# Patient Record
Sex: Female | Born: 1970 | Race: White | Hispanic: No | Marital: Single | Smoking: Never smoker
Health system: Southern US, Community
[De-identification: ages and names within clinical notes are randomized; demographics above are authoritative.]

## PROBLEM LIST (undated history)

## (undated) DIAGNOSIS — J45909 Unspecified asthma, uncomplicated: Secondary | ICD-10-CM

## (undated) DIAGNOSIS — N200 Calculus of kidney: Secondary | ICD-10-CM

## (undated) DIAGNOSIS — F329 Major depressive disorder, single episode, unspecified: Secondary | ICD-10-CM

## (undated) DIAGNOSIS — G43909 Migraine, unspecified, not intractable, without status migrainosus: Secondary | ICD-10-CM

## (undated) DIAGNOSIS — K2270 Barrett's esophagus without dysplasia: Secondary | ICD-10-CM

## (undated) DIAGNOSIS — I440 Atrioventricular block, first degree: Secondary | ICD-10-CM

## (undated) DIAGNOSIS — E039 Hypothyroidism, unspecified: Secondary | ICD-10-CM

## (undated) DIAGNOSIS — F419 Anxiety disorder, unspecified: Secondary | ICD-10-CM

## (undated) DIAGNOSIS — E559 Vitamin D deficiency, unspecified: Secondary | ICD-10-CM

## (undated) DIAGNOSIS — M659 Unspecified synovitis and tenosynovitis, unspecified site: Secondary | ICD-10-CM

## (undated) DIAGNOSIS — R945 Abnormal results of liver function studies: Secondary | ICD-10-CM

## (undated) DIAGNOSIS — R7989 Other specified abnormal findings of blood chemistry: Secondary | ICD-10-CM

## (undated) DIAGNOSIS — E041 Nontoxic single thyroid nodule: Secondary | ICD-10-CM

## (undated) DIAGNOSIS — E079 Disorder of thyroid, unspecified: Secondary | ICD-10-CM

## (undated) DIAGNOSIS — M1990 Unspecified osteoarthritis, unspecified site: Secondary | ICD-10-CM

## (undated) DIAGNOSIS — G4730 Sleep apnea, unspecified: Secondary | ICD-10-CM

## (undated) DIAGNOSIS — G2581 Restless legs syndrome: Secondary | ICD-10-CM

## (undated) DIAGNOSIS — K219 Gastro-esophageal reflux disease without esophagitis: Secondary | ICD-10-CM

## (undated) DIAGNOSIS — Z9889 Other specified postprocedural states: Secondary | ICD-10-CM

## (undated) DIAGNOSIS — R112 Nausea with vomiting, unspecified: Secondary | ICD-10-CM

## (undated) DIAGNOSIS — F32A Depression, unspecified: Secondary | ICD-10-CM

## (undated) HISTORY — PX: ABDOMINAL HYSTERECTOMY: SHX81

## (undated) HISTORY — PX: UPPER GI ENDOSCOPY: SHX6162

## (undated) HISTORY — PX: OTHER SURGICAL HISTORY: SHX169

## (undated) HISTORY — PX: EXCISION MORTON'S NEUROMA: SHX5013

## (undated) HISTORY — PX: HYSTEROSCOPY W/ ENDOMETRIAL ABLATION: SUR665

## (undated) HISTORY — PX: COLONOSCOPY: SHX174

## (undated) HISTORY — PX: OSTECTOMY CALCANEUS: SUR969

## (undated) HISTORY — PX: MENISECTOMY: SHX5181

## (undated) HISTORY — PX: TENOTOMY: SHX397

## (undated) HISTORY — PX: TUBAL LIGATION: SHX77

## (undated) HISTORY — PX: BUNIONECTOMY: SHX129

## (undated) HISTORY — PX: CHOLECYSTECTOMY: SHX55

## (undated) HISTORY — PX: EXPLORATORY LAPAROTOMY: SUR591

## (undated) HISTORY — PX: KNEE ARTHROSCOPY: SUR90

## (undated) HISTORY — PX: BREAST RECONSTRUCTION: SHX9

## (undated) HISTORY — PX: TOTAL THYROIDECTOMY: SHX2547

---
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
## 2005-08-28 HISTORY — PX: OTHER SURGICAL HISTORY: SHX169

## 2009-08-28 HISTORY — PX: BLADDER SUSPENSION: SHX72

## 2017-04-20 DIAGNOSIS — M170 Bilateral primary osteoarthritis of knee: Secondary | ICD-10-CM | POA: Insufficient documentation

## 2017-04-20 DIAGNOSIS — G5622 Lesion of ulnar nerve, left upper limb: Secondary | ICD-10-CM | POA: Insufficient documentation

## 2017-04-20 DIAGNOSIS — M778 Other enthesopathies, not elsewhere classified: Secondary | ICD-10-CM | POA: Insufficient documentation

## 2017-05-22 DIAGNOSIS — G2581 Restless legs syndrome: Secondary | ICD-10-CM | POA: Insufficient documentation

## 2017-05-22 DIAGNOSIS — K2270 Barrett's esophagus without dysplasia: Secondary | ICD-10-CM | POA: Insufficient documentation

## 2017-05-22 DIAGNOSIS — E890 Postprocedural hypothyroidism: Secondary | ICD-10-CM | POA: Insufficient documentation

## 2017-05-22 DIAGNOSIS — G43409 Hemiplegic migraine, not intractable, without status migrainosus: Secondary | ICD-10-CM | POA: Insufficient documentation

## 2017-05-22 DIAGNOSIS — K219 Gastro-esophageal reflux disease without esophagitis: Secondary | ICD-10-CM | POA: Insufficient documentation

## 2017-05-22 DIAGNOSIS — F329 Major depressive disorder, single episode, unspecified: Secondary | ICD-10-CM | POA: Insufficient documentation

## 2017-05-22 DIAGNOSIS — E559 Vitamin D deficiency, unspecified: Secondary | ICD-10-CM | POA: Insufficient documentation

## 2017-05-23 DIAGNOSIS — N200 Calculus of kidney: Secondary | ICD-10-CM | POA: Insufficient documentation

## 2017-05-23 DIAGNOSIS — G4733 Obstructive sleep apnea (adult) (pediatric): Secondary | ICD-10-CM | POA: Insufficient documentation

## 2017-05-23 DIAGNOSIS — F411 Generalized anxiety disorder: Secondary | ICD-10-CM | POA: Insufficient documentation

## 2017-05-23 DIAGNOSIS — E282 Polycystic ovarian syndrome: Secondary | ICD-10-CM | POA: Insufficient documentation

## 2017-05-23 DIAGNOSIS — K635 Polyp of colon: Secondary | ICD-10-CM | POA: Insufficient documentation

## 2017-05-23 DIAGNOSIS — E042 Nontoxic multinodular goiter: Secondary | ICD-10-CM | POA: Insufficient documentation

## 2017-05-23 DIAGNOSIS — Z9989 Dependence on other enabling machines and devices: Secondary | ICD-10-CM

## 2017-06-01 ENCOUNTER — Ambulatory Visit: Payer: BLUE CROSS/BLUE SHIELD | Admitting: Podiatry

## 2017-06-05 ENCOUNTER — Encounter: Payer: Self-pay | Admitting: Podiatry

## 2017-06-05 ENCOUNTER — Ambulatory Visit: Payer: BLUE CROSS/BLUE SHIELD

## 2017-06-05 ENCOUNTER — Ambulatory Visit (INDEPENDENT_AMBULATORY_CARE_PROVIDER_SITE_OTHER): Payer: BLUE CROSS/BLUE SHIELD | Admitting: Podiatry

## 2017-06-05 DIAGNOSIS — M2042 Other hammer toe(s) (acquired), left foot: Secondary | ICD-10-CM | POA: Diagnosis not present

## 2017-06-05 DIAGNOSIS — M79675 Pain in left toe(s): Secondary | ICD-10-CM

## 2017-06-05 NOTE — Progress Notes (Signed)
   Subjective:    Patient ID: Claudia Campbell, female    DOB: 1971/01/05, 46 y.o.   MRN: [id number]  HPI    Review of Systems     Objective:   Physical Exam        Assessment & Plan:

## 2017-06-05 NOTE — Progress Notes (Signed)
Patient ID: Claudia Campbell, female   DOB: [birth year], 46 y.o.   MRN: [id number]   HPI: 46 year old female presents to the office today for evaluation of toe pain to the second digit left foot. Patient has had multiple surgeries to the bilateral feet including bunion and hammertoe correction. Patient states that she is not sure why the surgeon perform surgery on the second toe of the left foot however now she has a hammertoe deformity. She presents today further treatment and evaluation.  No past medical history on file.   Physical Exam: General: The patient is alert and oriented x3 in no acute distress.  Dermatology: Skin is warm, dry and supple bilateral lower extremities. Negative for open lesions or macerations.  Vascular: Palpable pedal pulses bilaterally. No edema or erythema noted. Capillary refill within normal limits.  Neurological: Epicritic and protective threshold grossly intact bilaterally.   Musculoskeletal Exam: Hammertoe contracture deformity with a rigid PIPJ noted to the second digit left foot. Range of motion within normal limits to all pedal and ankle joints bilateral. Muscle strength 5/5 in all groups bilateral.   Radiographic Exam:  Normal osseous mineralization. Joint spaces preserved. No fracture/dislocation/boney destruction.    Assessment: -  Hammertoe second digit left foot   Plan of Care:  - Patient was evaluated today. X-rays reviewed today. - Today we discussed conservative versus surgical management of the hammertoe to the second digit left foot. Patient would like to proceed with surgical correction. All conservative modalities including toe strapping and shoe gear modifications failed to provide any sort of satisfactory alleviation of symptoms with the patient. All possible complications and details of the procedure were explained. All patient questions were answered. No guarantees were expressed or implied. - Authorization for surgery initiated today. Surgery  will consist of PIPJ arthroplasty with metatarsophalangeal joint capsulotomy second digit left foot -  We will prescribe baby aspirin postoperatively to prevent DVT - Surgical shoe dispensed today - Return to clinic 1 week postop   Edrick Kins, DPM [facility]  Dr. Edrick Kins, DPM    [address], [place] [address]                Office [phone]  Fax [phone]

## 2017-06-07 ENCOUNTER — Ambulatory Visit: Payer: BLUE CROSS/BLUE SHIELD | Admitting: Podiatry

## 2017-08-24 ENCOUNTER — Emergency Department: Payer: BLUE CROSS/BLUE SHIELD

## 2017-08-24 ENCOUNTER — Emergency Department
Admission: EM | Admit: 2017-08-24 | Discharge: 2017-08-25 | Disposition: A | Discharge status: Home / Self Care | Payer: BLUE CROSS/BLUE SHIELD | Attending: Emergency Medicine | Admitting: Emergency Medicine

## 2017-08-24 DIAGNOSIS — Y999 Unspecified external cause status: Secondary | ICD-10-CM | POA: Diagnosis not present

## 2017-08-24 DIAGNOSIS — S93402A Sprain of unspecified ligament of left ankle, initial encounter: Secondary | ICD-10-CM | POA: Diagnosis not present

## 2017-08-24 DIAGNOSIS — J45909 Unspecified asthma, uncomplicated: Secondary | ICD-10-CM | POA: Insufficient documentation

## 2017-08-24 DIAGNOSIS — W010XXA Fall on same level from slipping, tripping and stumbling without subsequent striking against object, initial encounter: Secondary | ICD-10-CM | POA: Diagnosis not present

## 2017-08-24 DIAGNOSIS — Z79899 Other long term (current) drug therapy: Secondary | ICD-10-CM | POA: Diagnosis not present

## 2017-08-24 DIAGNOSIS — S0990XA Unspecified injury of head, initial encounter: Secondary | ICD-10-CM | POA: Diagnosis present

## 2017-08-24 DIAGNOSIS — Y939 Activity, unspecified: Secondary | ICD-10-CM | POA: Diagnosis not present

## 2017-08-24 DIAGNOSIS — S40011A Contusion of right shoulder, initial encounter: Secondary | ICD-10-CM | POA: Insufficient documentation

## 2017-08-24 DIAGNOSIS — S060X0A Concussion without loss of consciousness, initial encounter: Secondary | ICD-10-CM | POA: Diagnosis not present

## 2017-08-24 DIAGNOSIS — Y929 Unspecified place or not applicable: Secondary | ICD-10-CM | POA: Diagnosis not present

## 2017-08-24 HISTORY — DX: Restless legs syndrome: G25.81

## 2017-08-24 HISTORY — DX: Disorder of thyroid, unspecified: E07.9

## 2017-08-24 HISTORY — DX: Major depressive disorder, single episode, unspecified: F32.9

## 2017-08-24 HISTORY — DX: Depression, unspecified: F32.A

## 2017-08-24 HISTORY — DX: Vitamin D deficiency, unspecified: E55.9

## 2017-08-24 HISTORY — DX: Barrett's esophagus without dysplasia: K22.70

## 2017-08-24 HISTORY — DX: Unspecified asthma, uncomplicated: J45.909

## 2017-08-24 LAB — URINALYSIS, COMPLETE (UACMP) WITH MICROSCOPIC
GLUCOSE, UA: NEGATIVE mg/dL
Hgb urine dipstick: NEGATIVE
Ketones, ur: 5 mg/dL — AB
NITRITE: NEGATIVE
PH: 5 (ref 5.0–8.0)
PROTEIN: 30 mg/dL — AB
Specific Gravity, Urine: 1.034 — ABNORMAL HIGH (ref 1.005–1.030)

## 2017-08-24 LAB — COMPREHENSIVE METABOLIC PANEL
ALBUMIN: 3.8 g/dL (ref 3.5–5.0)
ALK PHOS: 72 U/L (ref 38–126)
ALT: 76 U/L — ABNORMAL HIGH (ref 14–54)
ANION GAP: 10 (ref 5–15)
AST: 62 U/L — AB (ref 15–41)
BILIRUBIN TOTAL: 0.4 mg/dL (ref 0.3–1.2)
BUN: 13 mg/dL (ref 6–20)
CALCIUM: 9.5 mg/dL (ref 8.9–10.3)
CO2: 22 mmol/L (ref 22–32)
Chloride: 104 mmol/L (ref 101–111)
Creatinine, Ser: 0.76 mg/dL (ref 0.44–1.00)
GFR calc Af Amer: >60 mL/min (ref >60)
GFR calc non Af Amer: >60 mL/min (ref >60)
GLUCOSE: 154 mg/dL — AB (ref 65–99)
Potassium: 3.7 mmol/L (ref 3.5–5.1)
Sodium: 136 mmol/L (ref 135–145)
TOTAL PROTEIN: 7.4 g/dL (ref 6.5–8.1)

## 2017-08-24 LAB — CBC
HCT: 40.4 % (ref 35.0–47.0)
Hemoglobin: 13.1 g/dL (ref 12.0–16.0)
MCH: 27.2 pg (ref 26.0–34.0)
MCHC: 32.4 g/dL (ref 32.0–36.0)
MCV: 84 fL (ref 80.0–100.0)
Platelets: 341 10*3/uL (ref 150–440)
RBC: 4.81 MIL/uL (ref 3.80–5.20)
RDW: 14.9 % — AB (ref 11.5–14.5)
WBC: 8.4 10*3/uL (ref 3.6–11.0)

## 2017-08-24 LAB — GLUCOSE, CAPILLARY: Glucose-Capillary: 143 mg/dL — ABNORMAL HIGH (ref 65–99)

## 2017-08-24 NOTE — ED Notes (Signed)
Pt's sister states she is having "hemiplegia" currently. Pt with symmetrical face, equal weak hand grips, however pt states she had decreased sensation in left hand. Working on bed placement at this time. Ct was notified, spoke with toni.

## 2017-08-24 NOTE — ED Notes (Signed)
Patient transported to CT 

## 2017-08-24 NOTE — ED Triage Notes (Signed)
Patient reports LOC today, visual changes post fall. Patient reports nausea prior to fall, dizziness post fall.

## 2017-08-24 NOTE — ED Notes (Addendum)
Patient c/o numbness left hand. Upon assessment patient weak left upper and lower extremities. Patient has now has decreased sensation left face, arm, and leg.

## 2017-08-24 NOTE — ED Notes (Signed)
Pt fell while taking garbage cans out, striking posterior head on gas pack per family. Ems states pt complains of dizziness, and sensitivity to light. Pt's sister states pt lost consiousness.

## 2017-08-25 ENCOUNTER — Emergency Department: Payer: BLUE CROSS/BLUE SHIELD

## 2017-08-25 MED ORDER — METOCLOPRAMIDE HCL 5 MG/ML IJ SOLN
10.0000 mg | Freq: Once | INTRAMUSCULAR | Status: AC
  Administered 2017-08-25: 10 mg via INTRAMUSCULAR
  Filled 2017-08-25: qty 2

## 2017-08-25 MED ORDER — OXYCODONE-ACETAMINOPHEN 5-325 MG PO TABS
1.0000 | ORAL_TABLET | Freq: Once | ORAL | Status: AC
  Administered 2017-08-25: 1 via ORAL
  Filled 2017-08-25: qty 1

## 2017-08-25 MED ORDER — CEPHALEXIN 500 MG PO CAPS: 500.0000 mg | ORAL_CAPSULE | Freq: Two times a day (BID) | ORAL | 0 refills | Status: AC

## 2017-08-25 MED ORDER — IBUPROFEN 600 MG PO TABS
600.0000 mg | ORAL_TABLET | Freq: Once | ORAL | Status: AC
  Administered 2017-08-25: 600 mg via ORAL
  Filled 2017-08-25: qty 1

## 2017-08-25 NOTE — ED Provider Notes (Signed)
[facility] Emergency Department Provider Note ____________________________________________   First MD Initiated Contact with Patient 08/24/17 2341     (approximate)  I have reviewed the triage vital signs and the nursing notes.   HISTORY  Chief Complaint Altered Mental Status    HPI Claudia Campbell is a 46 y.o. female with past medical history as noted below who presents with head injury, acute onset when she fell onto the right posterior part of her head from standing height.  Patient states that she did not lose consciousness immediately, but then became nauseous and lightheaded and lost consciousness while she was being driven to the emergency department.  Patient also reports pain to the right shoulder and the left ankle.  She denies feeling lightheaded, dizzy, or weak or having any altered mental status or confusion other than the episode of syncope while being driven here.  She does report some tingling and numbness in the left side of her tongue and and which she states she has had previously during migraines.  Past Medical History:  Diagnosis Date  . Asthma   . Barrett esophagus   . Depression   . Restless leg   . Thyroid disease   . Vitamin D deficiency     Patient Active Problem List   Diagnosis Date Noted  . Anxiety, generalized 05/23/2017  . Hyperplastic colonic polyp 05/23/2017  . Kidney stones 05/23/2017  . Multiple thyroid nodules 05/23/2017  . OSA on CPAP 05/23/2017  . PCOS (polycystic ovarian syndrome) 05/23/2017  . Barrett's esophagus 05/22/2017  . GERD (gastroesophageal reflux disease) 05/22/2017  . 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 Major depression, chronic 05/22/2017  . 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  . Restless leg syndrome 05/22/2017  . Vitamin D deficiency, unspecified 05/22/2017  . Left wrist tendinitis 04/20/2017  . Primary osteoarthritis of both knees 04/20/2017  . Ulnar neuropathy at elbow of left upper  extremity 04/20/2017    Past Surgical History:  Procedure Laterality Date  . BREAST SURGERY    . CHOLECYSTECTOMY    . JOINT REPLACEMENT    . TONSILLECTOMY    . TUBAL LIGATION      Prior to Admission medications   Medication Sig Start Date End Date Taking? Authorizing Provider  cephALEXin (KEFLEX) 500 MG capsule Take 1 capsule (500 mg total) by mouth 2 (two) times daily for 5 days. 08/25/17 08/30/17  Arta Silence, MD  Cholecalciferol (VITAMIN D-1000 MAX ST) 1000 units tablet Take by mouth.    [provider]  esomeprazole (NEXIUM) 40 MG capsule Take by mouth.    [provider]  levothyroxine (SYNTHROID, LEVOTHROID) 300 MCG tablet Take by mouth.    [provider]  montelukast (SINGULAIR) 10 MG tablet Take by mouth. 05/22/17   [provider]  pramipexole (MIRAPEX) 1 MG tablet Take by mouth. 05/22/17   [provider]  sertraline (ZOLOFT) 100 MG tablet Take by mouth. 05/22/17 05/22/18  [provider]    Allergies Aspartame; Ciprofloxacin; Other; and Dicyclomine  No family history on file.  Social History Social History   Tobacco Use  . Smoking status: Never Smoker  . Smokeless tobacco: Never Used  Substance Use Topics  . Alcohol use: No  . Drug use: No    Review of Systems  Constitutional: No fever/chills. Eyes: No visual changes. ENT: No neck pain. Cardiovascular: Denies chest pain. Respiratory: Denies shortness of breath. Gastrointestinal: Positive for nausea, no vomiting. Genitourinary: Negative for dysuria, frequency, or hematuria.  Musculoskeletal: Negative for  back pain. Skin: Negative for rash. Neurological: Positive for headache, negative for focal weakness but reports left side numbness.   ____________________________________________   PHYSICAL EXAM:  VITAL SIGNS: ED Triage Vitals  Enc Vitals Group     BP 08/24/17 2235 138/68     Pulse Rate 08/24/17 2235 95     Resp 08/24/17 2235 18      Temp 08/24/17 2235 97.8 F (36.6 C)     Temp Source 08/24/17 2235 Oral     SpO2 08/24/17 2235 93 %     Weight 08/24/17 2236 (!) 415 lb (188.2 kg)     Height 08/24/17 2236 5\' 9"  (1.753 m)     Head Circumference --      Peak Flow --      Pain Score 08/24/17 2235 9     Pain Loc --      Pain Edu? --      Excl. in [place]? --     Constitutional: Alert and oriented. Well appearing and in no acute distress. Eyes: Conjunctivae are normal.  EOMI.  PERRLA. Head: Atraumatic.  Normal sensation both sides of face. Nose: No congestion/rhinnorhea. Mouth/Throat: Mucous membranes are moist.   Neck: Normal range of motion.  Cervical spine nontender. Cardiovascular: Normal rate, regular rhythm. Grossly normal heart sounds.  Good peripheral circulation. Respiratory: Normal respiratory effort.  No retractions. Lungs CTAB. Gastrointestinal:  No distention.  Genitourinary: No CVA tenderness. Musculoskeletal: No lower extremity edema.  Extremities warm and well perfused.  No midline spinal tenderness.  Mild tenderness to the right trapezius and pain on range of motion of the right shoulder.  Mild tenderness to left ankle.  2+ distal pulses to all extremities. Neurologic:  Normal speech and language.  Motor and sensory intact in all extremities.  Cranial nerves III through XII intact.  Normal coordination with no ataxia. Skin:  Skin is warm and dry. No rash noted. Psychiatric: Mood and affect are normal. Speech and behavior are normal.  ____________________________________________   LABS (all labs ordered are listed, but only abnormal results are displayed)  Labs Reviewed  COMPREHENSIVE METABOLIC PANEL - Abnormal; Notable for the following components:      Result Value   Glucose, Bld 154 (*)    AST 62 (*)    ALT 76 (*)    All other components within normal limits  CBC - Abnormal; Notable for the following components:   RDW 14.9 (*)    All other components within normal limits  URINALYSIS, COMPLETE  (UACMP) WITH MICROSCOPIC - Abnormal; Notable for the following components:   Color, Urine AMBER (*)    APPearance CLOUDY (*)    Specific Gravity, Urine 1.034 (*)    Bilirubin Urine SMALL (*)    Ketones, ur 5 (*)    Protein, ur 30 (*)    Leukocytes, UA SMALL (*)    Bacteria, UA RARE (*)    Squamous Epithelial / LPF TOO NUMEROUS TO COUNT (*)    All other components within normal limits  GLUCOSE, CAPILLARY - Abnormal; Notable for the following components:   Glucose-Capillary 143 (*)    All other components within normal limits  CBG MONITORING, ED  POC URINE PREG, ED   ____________________________________________  EKG  ED ECG REPORT I, Arta Silence, the attending physician, personally viewed and interpreted this ECG.  Date: 08/25/2017 EKG Time: 2230 Rate: 91 Rhythm: normal sinus rhythm QRS Axis: normal Intervals: normal ST/T Wave abnormalities: normal Narrative Interpretation: no evidence of acute ischemia; no prior  EKG available for comparison  ____________________________________________  RADIOLOGY  CT head: No acute findings. XR L ankle: No acute fracture. XR R shoulder: No acute fracture.  ____________________________________________   PROCEDURES  Procedure(s) performed: No    Critical Care performed: No ____________________________________________   INITIAL IMPRESSION / ASSESSMENT AND PLAN / ED COURSE  Pertinent labs & imaging results that were available during my care of the patient were reviewed by me and considered in my medical decision making (see chart for details).  46 year old female with past medical history as noted above presents with head injury after a mechanical trip and fall, as well as pain to the right shoulder and left ankle.  Patient had no LOC initially, but then reports an episode of syncope while she was being driven here to the emergency department, and then reported some tingling to the left side of her tongue and left hand  which she has had before during migraines, but no weakness..  Past medical records reviewed in Epic and are noncontributory.  On exam, vital signs normal, the patient is slightly uncomfortable but relatively well-appearing, the neuro exam is normal, and she is awake and alert.  There is no evidence of any altered mental status as was noted in triage.  She has pain to the right shoulder and left ankle as noted above as well.  Presentation is consistent with likely concussion, and I suspect that the syncope is vasovagal and related to nausea and pain after the fall..  There is no evidence on exam of any acute neurologic deficit.  Plan: CT head, x-rays of the right shoulder and left ankle, labs due to the syncope, and reassess.  ----------------------------------------- 3:55 AM on 08/25/2017 -----------------------------------------  X-rays, CT, and the lab workup are negative.  The urinalysis is consistent with an acute UTI.  The patient denies any urinary symptoms, but given the presence of leukocytes and WBCs I will treat based on the UA finding.  Given that this could be from other causes, patient instructed to follow-up with her primary care and have a repeat urinalysis in 1 week.  The patient states her headache is much improved, and she feels comfortable to go home.  Her neuro exam remains nonfocal.  Vital signs are stable.  I gave her extensive return precautions and instructions related to likely concussion, and she expressed understanding.     ____________________________________________   FINAL CLINICAL IMPRESSION(S) / ED DIAGNOSES  Final diagnoses:  Concussion without loss of consciousness, initial encounter  Contusion of right shoulder, initial encounter  Sprain of left ankle, unspecified ligament, initial encounter      NEW MEDICATIONS STARTED DURING THIS VISIT:  This SmartLink is deprecated. Use AVSMEDLIST instead to display the medication list for a patient.   Note:   This document was prepared using Dragon voice recognition software and may include unintentional dictation errors.    Arta Silence, MD 08/25/17 [phone]

## 2017-08-25 NOTE — Discharge Instructions (Signed)
Return to the emergency department for new, worsening, or persistent severe headache, persistent vomiting, lightheadedness or weakness, recurrent episodes of passing out or lightheadedness, or any other new or worsening symptoms that concern you.  Your urinalysis shows signs of a possible urinary tract infection.  We have prescribed a 5-day course of an antibiotic.  You should take this and then follow-up with your regular doctor to have the urine rechecked.  Return to the ER for any new or worsening urinary symptoms as well.

## 2017-10-12 HISTORY — PX: TOTAL THYROIDECTOMY: SHX2547

## 2017-11-09 ENCOUNTER — Other Ambulatory Visit: Payer: Self-pay | Admitting: Gastroenterology

## 2017-11-09 DIAGNOSIS — R7989 Other specified abnormal findings of blood chemistry: Secondary | ICD-10-CM

## 2017-11-09 DIAGNOSIS — R945 Abnormal results of liver function studies: Secondary | ICD-10-CM

## 2017-11-15 ENCOUNTER — Ambulatory Visit
Admission: RE | Admit: 2017-11-15 | Discharge: 2017-11-15 | Disposition: A | Discharge status: Home / Self Care | Payer: BLUE CROSS/BLUE SHIELD | Source: Ambulatory Visit | Attending: Gastroenterology | Admitting: Gastroenterology

## 2017-11-15 DIAGNOSIS — R945 Abnormal results of liver function studies: Secondary | ICD-10-CM | POA: Diagnosis not present

## 2017-11-15 DIAGNOSIS — R7989 Other specified abnormal findings of blood chemistry: Secondary | ICD-10-CM

## 2017-11-15 DIAGNOSIS — R160 Hepatomegaly, not elsewhere classified: Secondary | ICD-10-CM | POA: Insufficient documentation

## 2017-11-19 ENCOUNTER — Other Ambulatory Visit: Payer: Self-pay | Admitting: Gastroenterology

## 2017-11-19 DIAGNOSIS — R945 Abnormal results of liver function studies: Principal | ICD-10-CM

## 2017-11-19 DIAGNOSIS — R932 Abnormal findings on diagnostic imaging of liver and biliary tract: Secondary | ICD-10-CM

## 2017-11-19 DIAGNOSIS — R7989 Other specified abnormal findings of blood chemistry: Secondary | ICD-10-CM

## 2017-11-29 ENCOUNTER — Ambulatory Visit: Payer: BLUE CROSS/BLUE SHIELD

## 2017-12-06 ENCOUNTER — Ambulatory Visit
Admission: RE | Admit: 2017-12-06 | Discharge: 2017-12-06 | Disposition: A | Discharge status: Home / Self Care | Payer: BLUE CROSS/BLUE SHIELD | Source: Ambulatory Visit | Attending: Gastroenterology | Admitting: Gastroenterology

## 2017-12-21 ENCOUNTER — Ambulatory Visit: Payer: BLUE CROSS/BLUE SHIELD | Admitting: Podiatry

## 2017-12-21 ENCOUNTER — Encounter: Payer: Self-pay | Admitting: Podiatry

## 2017-12-21 DIAGNOSIS — M2042 Other hammer toe(s) (acquired), left foot: Secondary | ICD-10-CM

## 2017-12-21 NOTE — Patient Instructions (Signed)
Pre-Operative Instructions  Congratulations, you have decided to take an important step towards improving your quality of life.  You can be assured that the doctors and staff at [facility] will be with you every step of the way.  Here are some important things you should know:  1. Plan to be at the surgery center/hospital at least 1 (one) hour prior to your scheduled time, unless otherwise directed by the surgical center/hospital staff.  You must have a responsible adult accompany you, remain during the surgery and drive you home.  Make sure you have directions to the surgical center/hospital to ensure you arrive on time. 2. If you are having surgery at [facility] or [facility] hospitals, you will need a copy of your medical history and physical form from your family physician within one month prior to the date of surgery. We will give you a form for your primary physician to complete.  3. We make every effort to accommodate the date you request for surgery.  However, there are times where surgery dates or times have to be moved.  We will contact you as soon as possible if a change in schedule is required.   4. No aspirin/ibuprofen for one week before surgery.  If you are on aspirin, any non-steroidal anti-inflammatory medications (Mobic, Aleve, Ibuprofen) should not be taken seven (7) days prior to your surgery.  You make take Tylenol for pain prior to surgery.  5. Medications - If you are taking daily heart and blood pressure medications, seizure, reflux, allergy, asthma, anxiety, pain or diabetes medications, make sure you notify the surgery center/hospital before the day of surgery so they can tell you which medications you should take or avoid the day of surgery. 6. No food or drink after midnight the night before surgery unless directed otherwise by surgical center/hospital staff. 7. No alcoholic beverages 24-hours prior to surgery.  No smoking 24-hours prior or 24-hours after  surgery. 8. Wear loose pants or shorts. They should be loose enough to fit over bandages, boots, and casts. 9. Don't wear slip-on shoes. Sneakers are preferred. 10. Bring your boot with you to the surgery center/hospital.  Also bring crutches or a walker if your physician has prescribed it for you.  If you do not have this equipment, it will be provided for you after surgery. 11. If you have not been contacted by the surgery center/hospital by the day before your surgery, call to confirm the date and time of your surgery. 12. Leave-time from work may vary depending on the type of surgery you have.  Appropriate arrangements should be made prior to surgery with your employer. 13. Prescriptions will be provided immediately following surgery by your doctor.  Fill these as soon as possible after surgery and take the medication as directed. Pain medications will not be refilled on weekends and must be approved by the doctor. 14. Remove nail polish on the operative foot and avoid getting pedicures prior to surgery. 15. Wash the night before surgery.  The night before surgery wash the foot and leg well with water and the antibacterial soap provided. Be sure to pay special attention to beneath the toenails and in between the toes.  Wash for at least three (3) minutes. Rinse thoroughly with water and dry well with a towel.  Perform this wash unless told not to do so by your physician.  Enclosed: 1 Ice pack (please put in freezer the night before surgery)   1 Hibiclens skin cleaner     Pre-op instructions  If you have any questions regarding the instructions, please do not hesitate to call our office.  [place]: [address], [place], [place] [address] -- [phone]  [place]: [address] [place], [place] [address] -- [phone]  [place]: [address]  [place], [place] [address] -- [phone]  [place]: [address], [address], [place], [place] [address] -- [phone]  Website:  [email or web address] 

## 2017-12-24 ENCOUNTER — Telehealth: Payer: Self-pay | Admitting: *Deleted

## 2017-12-24 NOTE — Progress Notes (Signed)
Patient ID: Claudia Campbell, female   DOB: 1971-01-01, 47 y.o.   MRN: [id number]   HPI: 47 year old female presents to the office today for follow up evaluation of a hammertoe to the 2nd digit of the left foot. She is interested in surgical intervention at this time. She reports continued pain.  She also reports a new complaint of thickened, discolored nails of toes 2-5 of the right foot that have been symptomatic for the past 10+ years. There are no modifying factors noted. She has not done anything for treatment. Patient is here for further evaluation and treatment.   Past Medical History:  Diagnosis Date  . Asthma   . Barrett esophagus   . Depression   . Restless leg   . Thyroid disease   . Vitamin D deficiency      Physical Exam: General: The patient is alert and oriented x3 in no acute distress.  Dermatology: Hyperkeratotic, discolored, thickened, onychodystrophy of nails 1-4 of the right foot.Skin is warm, dry and supple bilateral lower extremities. Negative for open lesions or macerations.  Vascular: Palpable pedal pulses bilaterally. No edema or erythema noted. Capillary refill within normal limits.  Neurological: Epicritic and protective threshold grossly intact bilaterally.   Musculoskeletal Exam: Hammertoe contracture deformity with a rigid PIPJ noted to the second digit left foot. Range of motion within normal limits to all pedal and ankle joints bilateral. Muscle strength 5/5 in all groups bilateral.   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 Patient evaluated.  2. Today we discussed the conservative versus surgical management of the presenting pathology. The patient opts for surgical management. All possible complications and details of the procedure were explained. All patient questions were answered. No guarantees were expressed or implied. 3. Authorization for surgery was initiated today. Surgery will consist of  PIPJ arthroplasty 2nd toe left foot, MPJ capsulotomy and weil osteotomy. Total permanent nail avulsion 1-4 right.  4. Return to clinic one week post op.     Edrick Kins, DPM [facility]  Dr. Edrick Kins, DPM    [address], [place] [address]                Office [phone]  Fax [phone]

## 2017-12-24 NOTE — Telephone Encounter (Signed)
"  I am supposed to call and set up time to have surgery with Dr. Amalia Hailey.  If you could give me a call back, I'd greatly appreciate it."

## 2017-12-26 NOTE — Telephone Encounter (Signed)
"  I need to schedule my surgery with Dr. Amalia Hailey."  He does surgery on Thursdays.  Do you have a date in mind that you would like to schedule it?  "I'd like to do it on May 23."  I'll get it scheduled.  Someone from the surgical center will call you a day or two prior to your surgery date.  They will give you your arrival time.  You can register with the surgical center on- line, instructions are in the brochure that was given to you.

## 2018-01-04 ENCOUNTER — Telehealth: Payer: Self-pay | Admitting: *Deleted

## 2018-01-04 NOTE — Telephone Encounter (Signed)
I am calling you in regards to your surgery.  I was informed my the surgical center that your surgery cannot be performed their due to you body mass index (bmi).  They said you will have to be done at the hospital.  With that being said, the hospital requires a history and physical form be completed by your primary care physician.  Have you seen your primary care physician within the past 30 days?  "I am scheduled to see her today at 3 pm as a matter of fact."  What is her name?  I will fax her the form that needs to be completed.  "Her name is [name] and she is at [facility] in [place], [place]."  I will send her the form.  I will try and schedule it for the same day if possible but I may not be able to.  "I would like it to be the same day if possible because I have taken that day off."  I will let you know if there's a problem.  I am calling to let you know we could not find any time on May 23 to do your procedure.  I was able to get it scheduled for Tuesday Jan 15, 2018 at 7:30 am.  Is that date and time okay?  "Yes, that will be fine.  What time will I have to be there?" They normally have you arrive two hours early.  "Okay, thank you so much."  "Claudia Campbell, I was looking at my calendar and I realized my dad is having a Colonoscopy that day.  He's my transportation.  I can't ask him to reschedule it because he's being battling colon cancer.  Can we do it on May 28?"  I will call and see what they have available.  I will let you know once I find out if anything is available.    I called and they did not have anything available on Tuesday, May 28.  However, I got it scheduled for May 29 at 12N.  Is this date okay with you?  "Yes, that will be fine.  I need to be there a couple of hours early, correct?"  Yes, that is correct.  You will get a call from someone prior to your surgery date.  They will give you your exact arrival time.  Your surgery will be done at [facility] in their [place].  "What's their address?"  It is [address]

## 2018-01-09 ENCOUNTER — Telehealth: Payer: Self-pay | Admitting: *Deleted

## 2018-01-09 NOTE — Telephone Encounter (Signed)
"  I received the notes from your doctor but it's from December, 2018.  [facility] is not going to accept this as your history and physical form.  The visit has to have been within the past 30 days of the surgery date.  "That's bullshit.  That is not true.  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  Are you all going to pay for me to have another physical?  My insurance only covers one physical a year!  This shit doesn't even make sense."  It's a [facility] policy, I do want to argue with you.  "I just saw them for an appointment!"  We did not receive those notes.  Can you get them to send it to [place]?  "I will call them right now."  Thank you.  I apologize Claudia Campbell.  They had sent the 01/04/18 note underneath the December note.  I am so sorry.  Everything should be fine.  "That's okay, I understand."

## 2018-01-17 ENCOUNTER — Encounter: Payer: Self-pay | Admitting: *Deleted

## 2018-01-17 ENCOUNTER — Encounter (HOSPITAL_COMMUNITY): Payer: Self-pay

## 2018-01-17 NOTE — Progress Notes (Signed)
Preop on 01/18/2018.  Surgery on 01/23/18.  Patient needs orders in epic.  Thank You.

## 2018-01-17 NOTE — Pre-Procedure Instructions (Signed)
EKG 08/25/2017 in epic

## 2018-01-17 NOTE — Patient Instructions (Addendum)
Your procedure is scheduled on: Wednesday, Jan 23, 2018   Surgery Time:  12Noon-2:00PM   Report to [facility] Main  Entrance    Report to admitting at 10:00 AM   Call this number if you have problems the morning of surgery [phone]   Do not eat food or drink liquids :After Midnight.   Do NOT smoke after Midnight   Complete one Ensure drink the morning of surgery by       the day of surgery.   Drink 2 Ensure drinks the night before surgery.  Complete one Ensure drink the morning of surgery 3 hours prior to scheduled surgery.   Take these medicines the morning of surgery with A SIP OF WATER: Nexium, Synthroid, Zoloft   Use Nasal spray per normal routine   Bring Asthma Inhaler day of surgery                               You may not have any metal on your body including hair pins, jewelry, and body piercings             Do not wear make-up, lotions, powders, perfumes/cologne, or deodorant             Do not wear nail polish.  Do not shave  48 hours prior to surgery.               Do not bring valuables to the hospital. [place].   Contacts, dentures or bridgework may not be worn into surgery.    Patients discharged the day of surgery will not be allowed to drive home.   Name and phone number of your driver:   Special Instructions: Bring a copy of your healthcare power of attorney and living will documents         the day of surgery if you haven't scanned them in before.              Please read over the following fact sheets you were given:  [facility] - Preparing for Surgery Before surgery, you can play an important role.  Because skin is not sterile, your skin needs to be as free of germs as possible.  You can reduce the number of germs on your skin by washing with CHG (chlorahexidine gluconate) soap before surgery.  CHG is an antiseptic cleaner which kills germs and bonds with the skin to continue killing  germs even after washing. Please DO NOT use if you have an allergy to CHG or antibacterial soaps.  If your skin becomes reddened/irritated stop using the CHG and inform your nurse when you arrive at Short Stay. Do not shave (including legs and underarms) for at least 48 hours prior to the first CHG shower.  You may shave your face/neck.  Please follow these instructions carefully:  1.  Shower with CHG Soap the night before surgery and the  morning of surgery.  2.  If you choose to wash your hair, wash your hair first as usual with your normal  shampoo.  3.  After you shampoo, rinse your hair and body thoroughly to remove the shampoo.                             4.  Use CHG as you would any other liquid soap.  You can apply chg directly to the skin and wash.  Gently with a scrungie or clean washcloth.  5.  Apply the CHG Soap to your body ONLY FROM THE NECK DOWN.   Do   not use on face/ open                           Wound or open sores. Avoid contact with eyes, ears mouth and   genitals (private parts).                       Wash face,  Genitals (private parts) with your normal soap.             6.  Wash thoroughly, paying special attention to the area where your    surgery  will be performed.  7.  Thoroughly rinse your body with warm water from the neck down.  8.  DO NOT shower/wash with your normal soap after using and rinsing off the CHG Soap.                9.  Pat yourself dry with a clean towel.            10.  Wear clean pajamas.            11.  Place clean sheets on your bed the night of your first shower and do not  sleep with pets. Day of Surgery : Do not apply any lotions/deodorants the morning of surgery.  Please wear clean clothes to the hospital/surgery center.  FAILURE TO FOLLOW THESE INSTRUCTIONS MAY RESULT IN THE CANCELLATION OF YOUR SURGERY  PATIENT SIGNATURE_________________________________  NURSE  SIGNATURE__________________________________  ________________________________________________________________________

## 2018-01-18 ENCOUNTER — Ambulatory Visit: Payer: BLUE CROSS/BLUE SHIELD | Admitting: Anesthesiology

## 2018-01-18 ENCOUNTER — Other Ambulatory Visit: Payer: Self-pay

## 2018-01-18 ENCOUNTER — Ambulatory Visit
Admission: RE | Admit: 2018-01-18 | Discharge: 2018-01-18 | Disposition: A | Discharge status: Home / Self Care | Payer: BLUE CROSS/BLUE SHIELD | Source: Ambulatory Visit | Attending: Gastroenterology | Admitting: Gastroenterology

## 2018-01-18 ENCOUNTER — Encounter: Payer: Self-pay | Admitting: Anesthesiology

## 2018-01-18 ENCOUNTER — Encounter
Admission: RE | Disposition: A | Discharge status: Home / Self Care | Payer: Self-pay | Source: Ambulatory Visit | Attending: Gastroenterology

## 2018-01-18 ENCOUNTER — Telehealth: Payer: Self-pay | Admitting: *Deleted

## 2018-01-18 ENCOUNTER — Encounter (HOSPITAL_COMMUNITY)
Admission: RE | Admit: 2018-01-18 | Discharge: 2018-01-18 | Disposition: A | Discharge status: Home / Self Care | Payer: BLUE CROSS/BLUE SHIELD | Source: Ambulatory Visit | Attending: Podiatry | Admitting: Podiatry

## 2018-01-18 ENCOUNTER — Ambulatory Visit (HOSPITAL_COMMUNITY)
Admission: RE | Admit: 2018-01-18 | Discharge: 2018-01-18 | Disposition: A | Discharge status: Home / Self Care | Payer: BLUE CROSS/BLUE SHIELD | Source: Ambulatory Visit | Attending: Anesthesiology | Admitting: Anesthesiology

## 2018-01-18 ENCOUNTER — Encounter (HOSPITAL_COMMUNITY): Payer: Self-pay

## 2018-01-18 DIAGNOSIS — J849 Interstitial pulmonary disease, unspecified: Secondary | ICD-10-CM | POA: Insufficient documentation

## 2018-01-18 DIAGNOSIS — G43909 Migraine, unspecified, not intractable, without status migrainosus: Secondary | ICD-10-CM | POA: Diagnosis not present

## 2018-01-18 DIAGNOSIS — Z01818 Encounter for other preprocedural examination: Secondary | ICD-10-CM

## 2018-01-18 DIAGNOSIS — K317 Polyp of stomach and duodenum: Secondary | ICD-10-CM | POA: Insufficient documentation

## 2018-01-18 DIAGNOSIS — I440 Atrioventricular block, first degree: Secondary | ICD-10-CM | POA: Diagnosis not present

## 2018-01-18 DIAGNOSIS — I878 Other specified disorders of veins: Secondary | ICD-10-CM | POA: Insufficient documentation

## 2018-01-18 DIAGNOSIS — I517 Cardiomegaly: Secondary | ICD-10-CM | POA: Diagnosis not present

## 2018-01-18 DIAGNOSIS — K2270 Barrett's esophagus without dysplasia: Secondary | ICD-10-CM | POA: Diagnosis present

## 2018-01-18 DIAGNOSIS — K210 Gastro-esophageal reflux disease with esophagitis: Secondary | ICD-10-CM | POA: Insufficient documentation

## 2018-01-18 DIAGNOSIS — E041 Nontoxic single thyroid nodule: Secondary | ICD-10-CM | POA: Diagnosis not present

## 2018-01-18 DIAGNOSIS — G2581 Restless legs syndrome: Secondary | ICD-10-CM | POA: Diagnosis not present

## 2018-01-18 DIAGNOSIS — Z6841 Body Mass Index (BMI) 40.0 and over, adult: Secondary | ICD-10-CM | POA: Insufficient documentation

## 2018-01-18 DIAGNOSIS — J45909 Unspecified asthma, uncomplicated: Secondary | ICD-10-CM | POA: Insufficient documentation

## 2018-01-18 DIAGNOSIS — E559 Vitamin D deficiency, unspecified: Secondary | ICD-10-CM | POA: Diagnosis not present

## 2018-01-18 DIAGNOSIS — E039 Hypothyroidism, unspecified: Secondary | ICD-10-CM | POA: Diagnosis not present

## 2018-01-18 DIAGNOSIS — G4730 Sleep apnea, unspecified: Secondary | ICD-10-CM | POA: Diagnosis not present

## 2018-01-18 DIAGNOSIS — F329 Major depressive disorder, single episode, unspecified: Secondary | ICD-10-CM | POA: Diagnosis not present

## 2018-01-18 DIAGNOSIS — K2960 Other gastritis without bleeding: Secondary | ICD-10-CM | POA: Insufficient documentation

## 2018-01-18 DIAGNOSIS — R0989 Other specified symptoms and signs involving the circulatory and respiratory systems: Secondary | ICD-10-CM | POA: Insufficient documentation

## 2018-01-18 DIAGNOSIS — M1990 Unspecified osteoarthritis, unspecified site: Secondary | ICD-10-CM | POA: Diagnosis not present

## 2018-01-18 HISTORY — DX: Other specified postprocedural states: Z98.890

## 2018-01-18 HISTORY — DX: Gastro-esophageal reflux disease without esophagitis: K21.9

## 2018-01-18 HISTORY — DX: Abnormal results of liver function studies: R94.5

## 2018-01-18 HISTORY — DX: Migraine, unspecified, not intractable, without status migrainosus: G43.909

## 2018-01-18 HISTORY — DX: Unspecified synovitis and tenosynovitis, unspecified site: M65.90

## 2018-01-18 HISTORY — DX: Sleep apnea, unspecified: G47.30

## 2018-01-18 HISTORY — PX: ESOPHAGOGASTRODUODENOSCOPY (EGD) WITH PROPOFOL: SHX5813

## 2018-01-18 HISTORY — DX: Other specified abnormal findings of blood chemistry: R79.89

## 2018-01-18 HISTORY — DX: Nontoxic single thyroid nodule: E04.1

## 2018-01-18 HISTORY — DX: Atrioventricular block, first degree: I44.0

## 2018-01-18 HISTORY — DX: Unspecified osteoarthritis, unspecified site: M19.90

## 2018-01-18 HISTORY — DX: Synovitis and tenosynovitis, unspecified: M65.9

## 2018-01-18 HISTORY — DX: Hypothyroidism, unspecified: E03.9

## 2018-01-18 HISTORY — DX: Other specified postprocedural states: R11.2

## 2018-01-18 LAB — CBC
HEMATOCRIT: 43 % (ref 36.0–46.0)
HEMOGLOBIN: 13.6 g/dL (ref 12.0–15.0)
MCH: 28.2 pg (ref 26.0–34.0)
MCHC: 31.6 g/dL (ref 30.0–36.0)
MCV: 89 fL (ref 78.0–100.0)
Platelets: 348 10*3/uL (ref 150–400)
RBC: 4.83 MIL/uL (ref 3.87–5.11)
RDW: 14.9 % (ref 11.5–15.5)
WBC: 9.9 10*3/uL (ref 4.0–10.5)

## 2018-01-18 LAB — POCT PREGNANCY, URINE: Preg Test, Ur: NEGATIVE

## 2018-01-18 SURGERY — ESOPHAGOGASTRODUODENOSCOPY (EGD) WITH PROPOFOL
Anesthesia: General

## 2018-01-18 MED ORDER — PROPOFOL 500 MG/50ML IV EMUL
INTRAVENOUS | Status: AC
  Filled 2018-01-18: qty 50

## 2018-01-18 MED ORDER — PROPOFOL 500 MG/50ML IV EMUL
INTRAVENOUS | Status: DC | PRN
  Administered 2018-01-18: 160 ug/kg/min via INTRAVENOUS

## 2018-01-18 MED ORDER — GLYCOPYRROLATE 0.2 MG/ML IJ SOLN
INTRAMUSCULAR | Status: DC | PRN
  Administered 2018-01-18: 0.2 mg via INTRAVENOUS

## 2018-01-18 MED ORDER — PROPOFOL 10 MG/ML IV BOLUS
INTRAVENOUS | Status: DC | PRN
  Administered 2018-01-18: 140 mg via INTRAVENOUS

## 2018-01-18 MED ORDER — MIDAZOLAM HCL 2 MG/2ML IJ SOLN
INTRAMUSCULAR | Status: AC
  Filled 2018-01-18: qty 2

## 2018-01-18 MED ORDER — EPHEDRINE SULFATE 50 MG/ML IJ SOLN
INTRAMUSCULAR | Status: AC
  Filled 2018-01-18: qty 1

## 2018-01-18 MED ORDER — DEXAMETHASONE SODIUM PHOSPHATE 10 MG/ML IJ SOLN
INTRAMUSCULAR | Status: DC | PRN
  Administered 2018-01-18: 5 mg via INTRAVENOUS

## 2018-01-18 MED ORDER — MIDAZOLAM HCL 2 MG/2ML IJ SOLN
INTRAMUSCULAR | Status: DC | PRN
  Administered 2018-01-18: 2 mg via INTRAVENOUS

## 2018-01-18 MED ORDER — SUGAMMADEX SODIUM 200 MG/2ML IV SOLN
INTRAVENOUS | Status: DC | PRN
  Administered 2018-01-18: 200 mg via INTRAVENOUS

## 2018-01-18 MED ORDER — LIDOCAINE HCL (CARDIAC) PF 100 MG/5ML IV SOSY
PREFILLED_SYRINGE | INTRAVENOUS | Status: DC | PRN
  Administered 2018-01-18: 30 mg via INTRAVENOUS

## 2018-01-18 MED ORDER — FENTANYL CITRATE (PF) 100 MCG/2ML IJ SOLN
INTRAMUSCULAR | Status: DC | PRN
  Administered 2018-01-18: 50 ug via INTRAVENOUS

## 2018-01-18 MED ORDER — SODIUM CHLORIDE 0.9 % IV SOLN
INTRAVENOUS | Status: DC
  Administered 2018-01-18: 1000 mL via INTRAVENOUS

## 2018-01-18 MED ORDER — LIDOCAINE HCL (PF) 2 % IJ SOLN
INTRAMUSCULAR | Status: AC
  Filled 2018-01-18: qty 10

## 2018-01-18 MED ORDER — ROCURONIUM BROMIDE 100 MG/10ML IV SOLN
INTRAVENOUS | Status: DC | PRN
  Administered 2018-01-18: 15 mg via INTRAVENOUS

## 2018-01-18 MED ORDER — SODIUM CHLORIDE 0.9 % IV SOLN: INTRAVENOUS | Status: DC

## 2018-01-18 MED ORDER — FENTANYL CITRATE (PF) 100 MCG/2ML IJ SOLN
INTRAMUSCULAR | Status: AC
  Filled 2018-01-18: qty 2

## 2018-01-18 MED ORDER — ONDANSETRON HCL 4 MG/2ML IJ SOLN
INTRAMUSCULAR | Status: DC | PRN
  Administered 2018-01-18 (×2): 4 mg via INTRAVENOUS

## 2018-01-18 NOTE — Anesthesia Preprocedure Evaluation (Addendum)
Anesthesia Evaluation  Patient identified by MRN, date of birth, ID band Patient awake    Reviewed: Allergy & Precautions, NPO status , Patient's Chart, lab work & pertinent test results  History of Anesthesia Complications (+) PONV and history of anesthetic complications  Airway Mallampati: III       Dental   Pulmonary asthma , sleep apnea (not using CPAP "because it is broken") , neg COPD,           Cardiovascular (-) hypertension(-) Past MI and (-) CHF (-) dysrhythmias (-) Valvular Problems/Murmurs     Neuro/Psych neg Seizures Anxiety Depression    GI/Hepatic Neg liver ROS, GERD  Medicated and Poorly Controlled,  Endo/Other  Hypothyroidism Morbid obesity  Renal/GU Renal disease (stones)     Musculoskeletal   Abdominal   Peds  Hematology   Anesthesia Other Findings   Reproductive/Obstetrics                             Anesthesia Physical Anesthesia Plan  ASA: III  Anesthesia Plan: General   Post-op Pain Management:    Induction: Intravenous  PONV Risk Score and Plan: 4 or greater and TIVA, Propofol infusion and Treatment may vary due to age or medical condition  Airway Management Planned: Oral ETT  Additional Equipment:   Intra-op Plan:   Post-operative Plan:   Informed Consent: I have reviewed the patients History and Physical, chart, labs and discussed the procedure including the risks, benefits and alternatives for the proposed anesthesia with the patient or authorized representative who has indicated his/her understanding and acceptance.     Plan Discussed with:   Anesthesia Plan Comments:        Anesthesia Quick Evaluation

## 2018-01-18 NOTE — Anesthesia Procedure Notes (Signed)
Procedure Name: Intubation Performed by: Vaughan Sine Pre-anesthesia Checklist: Patient identified, Emergency Drugs available, Suction available, Patient being monitored and Timeout performed Patient Re-evaluated:Patient Re-evaluated prior to induction Oxygen Delivery Method: Circle system utilized Preoxygenation: Pre-oxygenation with 100% oxygen Induction Type: IV induction Ventilation: Two handed mask ventilation required Grade View: Grade IV Tube type: Oral Tube size: 7.0 mm Number of attempts: 1 Airway Equipment and Method: Patient positioned with wedge pillow,  Stylet and Bite block Placement Confirmation: ETT inserted through vocal cords under direct vision,  positive ETCO2,  CO2 detector and breath sounds checked- equal and bilateral Secured at: 21 cm Tube secured with: Tape Difficulty Due To: Difficulty was anticipated, Difficult Airway- due to large tongue and Difficult Airway- due to reduced neck mobility

## 2018-01-18 NOTE — H&P (Signed)
Outpatient short stay form Pre-procedure 01/18/2018 8:57 AM Lollie Sails MD  Primary Physician: [name] MD  Reason for visit: EGD  History of present illness: Patient is a 47 year old female presenting today with a history of Barrett's esophagus for EGD.  She had 2 previous EGDs done showing Barrett's esophagus the last one in 2016.  She does take Nexium 40 mg twice a day.  She does have some breakthrough if she misses these in terms of reflux symptoms.  Also has a history of loose stools long-term/chronic since her cholecystectomy over 10 years ago.  She does not take any recent aspirin products she is on no blood thinning agents.      Current Facility-Administered Medications:  .  0.9 %  sodium chloride infusion, , Intravenous, Continuous, Lollie Sails, MD .  0.9 %  sodium chloride infusion, , Intravenous, Continuous, Lollie Sails, MD, Last Rate: 20 mL/hr at 01/18/18 5208, 1,000 mL at 01/18/18 0223  Medications Prior to Admission  Medication Sig Dispense Refill Last Dose  . albuterol (PROVENTIL HFA) 108 (90 Base) MCG/ACT inhaler Inhale 1 puff into the lungs every 6 (six) hours as needed for wheezing or shortness of breath.    Past Month at Unknown time  . aspirin-acetaminophen-caffeine (EXCEDRIN MIGRAINE) 250-250-65 MG tablet Take 2 tablets by mouth every 6 (six) hours as needed for migraine.   Past Month at Unknown time  . Cholecalciferol (VITAMIN D3) 50000 units CAPS Take 5,000 Units by mouth 2 (two) times daily.   Past Month at Unknown time  . clobetasol (TEMOVATE) 0.05 % external solution Apply 1 application topically daily.    Past Month at Unknown time  . clobetasol ointment (TEMOVATE) 3.61 % Apply 1 application topically 2 (two) times daily as needed (psoriasis).    Past Month at Unknown time  . esomeprazole (NEXIUM) 40 MG capsule Take 40 mg by mouth 2 (two) times daily.    01/17/2018 at Unknown time  . fluticasone (FLONASE) 50 MCG/ACT nasal spray Place 1  spray into both nostrils daily.   01/17/2018 at Unknown time  . levothyroxine (SYNTHROID, LEVOTHROID) 175 MCG tablet Take 175 mcg by mouth daily before breakfast.    01/18/2018 at 0500  . montelukast (SINGULAIR) 10 MG tablet Take 10 mg by mouth at bedtime.    01/17/2018 at Unknown time  . Multiple Vitamin (MULTIVITAMIN) tablet Take 1 tablet by mouth daily.   Past Week at Unknown time  . pramipexole (MIRAPEX) 1 MG tablet Take 2 mg by mouth at bedtime.    01/17/2018 at Unknown time  . sertraline (ZOLOFT) 100 MG tablet Take 150 mg by mouth daily.    01/17/2018 at Unknown time  . calcium carbonate (TUMS - DOSED IN MG ELEMENTAL CALCIUM) 500 MG chewable tablet Chew 1 tablet by mouth daily.   Not Taking at Unknown time  . cholestyramine (QUESTRAN) 4 g packet Take 4 g by mouth 3 (three) times daily with meals.   Not Taking at Unknown time     Allergies  Allergen Reactions  . Aspartame Other (See Comments)    Passes out  . Ciprofloxacin Hives  . Dicyclomine Palpitations     Past Medical History:  Diagnosis Date  . Abnormal liver function test   . Arthritis   . Asthma   . Barrett esophagus   . Depression   . First degree AV block   . GERD (gastroesophageal reflux disease)   . Hypothyroidism    Post ablation  . Migraines   .  PONV (postoperative nausea and vomiting)   . Post-operative nausea and vomiting   . Restless leg   . Restless leg   . Sleep apnea   . Thyroid disease   . Thyroid nodule   . Vitamin D deficiency     Review of systems:      Physical Exam    Heart and lungs: Regular rate and rhythm without rub or gallop, lungs are bilaterally clear.    HEENT: Normocephalic atraumatic eyes are anicteric    Other:    Pertinant exam for procedure: Nontender sounds are positive normoactive morbidly obese.    Planned proceedures: EGD and indicated procedures.  In discussion with anesthesia with a are electing for intubation for the procedure.  Agree with this. I have discussed  the risks benefits and complications of procedures to include not limited to bleeding, infection, perforation and the risk of sedation and the patient wishes to proceed.    Lollie Sails, MD Gastroenterology 01/18/2018  8:57 AM

## 2018-01-18 NOTE — Anesthesia Postprocedure Evaluation (Signed)
Anesthesia Post Note  Patient: Claudia Campbell  Procedure(s) Performed: ESOPHAGOGASTRODUODENOSCOPY (EGD) WITH PROPOFOL (N/A )  Patient location during evaluation: Endoscopy Anesthesia Type: General Level of consciousness: awake and alert Pain management: pain level controlled Vital Signs Assessment: post-procedure vital signs reviewed and stable Respiratory status: spontaneous breathing and respiratory function stable Cardiovascular status: stable Anesthetic complications: no     Last Vitals:  Vitals:   01/18/18 0755 01/18/18 0954  BP: (!) 138/100 (!) 132/57  Pulse: 70 81  Resp: 20 13  Temp: (!) 36.2 C (!) 36.1 C  SpO2: 96% 98%    Last Pain:  Vitals:   01/18/18 0954  TempSrc:   PainSc: 0-No pain                 Izeyah Deike K

## 2018-01-18 NOTE — Telephone Encounter (Signed)
"  We need a copy of the patient's H&P to be sent to [place] and we need the doctor to put in the orders.  My fax number is [phone].  She's having surgery on Wednesday, 01/23/2018."  I will fax the H&P and let Dr. Amalia Hailey know to put in the orders.  I faxed the requested information to [place].  I sent a message to Dr. Amalia Hailey about the orders.

## 2018-01-18 NOTE — Pre-Procedure Instructions (Signed)
Left chart with Ihechi to follow CXR results

## 2018-01-18 NOTE — Pre-Procedure Instructions (Signed)
Portable equipment notified of need for [place].

## 2018-01-18 NOTE — Op Note (Signed)
[facility] Gastroenterology Patient Name: Claudia Campbell Procedure Date: 01/18/2018 8:59 AM MRN: [id number] Account #: [id number] Date of Birth: [birth year] Admit Type: Outpatient Age: 47 Room: [facility] ENDO ROOM 1 Gender: Female Note Status: Finalized Procedure:            Upper GI endoscopy Indications:          Follow-up of Barrett's esophagus Providers:            Lollie Sails, MD Referring MD:         No Local Md, MD (Referring MD) Medicines:            Monitored Anesthesia Care Complications:        No immediate complications. Procedure:            Pre-Anesthesia Assessment:                       - ASA Grade Assessment: III - A patient with severe                        systemic disease.                       After obtaining informed consent, the endoscope was                        passed under direct vision. Throughout the procedure,                        the patient's blood pressure, pulse, and oxygen                        saturations were monitored continuously. The Endoscope                        was introduced through the mouth, and advanced to the                        third part of duodenum. The patient tolerated the                        procedure well. The upper GI endoscopy was accomplished                        without difficulty. Findings:      There were esophageal mucosal changes consistent with short-segment       Barrett's esophagus present in the lower third of the esophagus. The       maximum longitudinal extent of these mucosal changes was 3 cm in length.       Mucosa was biopsied with a cold forceps for histology in 4 quadrants at       intervals of 1 cm at 38 and 40 cm from the incisors. A total of 2       specimen bottles were sent to pathology.      The exam of the esophagus was otherwise normal.      Patchy mild inflammation characterized by congestion (edema), erosions       and erythema was found in the gastric antrum.  Biopsies were taken from       antrum and body with a cold forceps for histology. Biopsies were taken  from antrum and body with a cold forceps for Helicobacter pylori testing.      I retroflexed to evaluate the upper stomach however was unable to attain       adequate insufflation to see the fundus well.      The examined duodenum was normal.      The exam of the esophagus was otherwise normal.      A single 4 mm sessile polyp with no bleeding and no stigmata of recent       bleeding was found in the gastric body. The polyp was removed with a       cold biopsy forceps. Resection and retrieval were complete. Impression:           - Esophageal mucosal changes consistent with                        short-segment Barrett's esophagus. Biopsied.                       - Erosive gastritis. Biopsied.                       - Normal examined duodenum. Recommendation:       - Discharge patient to home.                       - Soft diet today, then advance as tolerated to advance                        diet as tolerated.                       - Do an upper GI series at appointment to be scheduled. Procedure Code(s):    --- Professional ---                       [phone], Esophagogastroduodenoscopy, flexible, transoral;                        with biopsy, single or multiple Diagnosis Code(s):    --- Professional ---                       K22.70, Barrett's esophagus without dysplasia                       K29.60, Other gastritis without bleeding CPT copyright 2017 American Medical Association. All rights reserved. The codes documented in this report are preliminary and upon coder review may  be revised to meet current compliance requirements. Lollie Sails, MD 01/18/2018 9:43:09 AM This report has been signed electronically. Number of Addenda: 0 Note Initiated On: 01/18/2018 8:59 AM      [facility]

## 2018-01-18 NOTE — Transfer of Care (Signed)
Immediate Anesthesia Transfer of Care Note  Patient: Claudia Campbell  Procedure(s) Performed: ESOPHAGOGASTRODUODENOSCOPY (EGD) WITH PROPOFOL (N/A )  Patient Location: PACU  Anesthesia Type:General  Level of Consciousness: awake, oriented and sedated  Airway & Oxygen Therapy: Patient Spontanous Breathing and Patient connected to face mask oxygen  Post-op Assessment: Report given to RN and Post -op Vital signs reviewed and stable  Post vital signs: Reviewed and stable  Last Vitals:  Vitals Value Taken Time  BP    Temp    Pulse    Resp    SpO2      Last Pain:  Vitals:   01/18/18 0755  TempSrc: Tympanic  PainSc: 0-No pain         Complications: No apparent anesthesia complications

## 2018-01-18 NOTE — OR Nursing (Addendum)
Pt discharged home via w/c. Refused to await to talk to DR [facility].PT'S FATHER DID SPEAK WITH MD EARLIER. VS AT DISCHARGE 96-81-14-BP 115/82. SAT 98%. I/S ON SOFT DIET AND NEEDED F/U WITH MD OFFICE RE:GI SERIES. UNDERSTANDING VOICED

## 2018-01-18 NOTE — Anesthesia Post-op Follow-up Note (Signed)
Anesthesia QCDR form completed.        

## 2018-01-22 ENCOUNTER — Encounter: Payer: Self-pay | Admitting: Gastroenterology

## 2018-01-22 LAB — SURGICAL PATHOLOGY

## 2018-01-22 NOTE — Progress Notes (Signed)
CXR done on 01-18-18 shown to Anesthesia Dr Suella Broad due to noted "pulmonary vascular congestion". RN also showed Dr Smith Robert patients last EKG , and made him aware of patient hx of asthma, sleep apnea (no cpap use), and BMI of 63. Per dr Smith Robert, patient may require the use of a block for anesthesia instead of using general anesthesia but decision will ultimately be made day of surgery . Otherwise patient may proceed as scheduled.

## 2018-01-23 ENCOUNTER — Encounter (HOSPITAL_COMMUNITY): Payer: Self-pay | Admitting: Emergency Medicine

## 2018-01-23 ENCOUNTER — Encounter: Payer: Self-pay | Admitting: Podiatry

## 2018-01-23 ENCOUNTER — Ambulatory Visit (HOSPITAL_COMMUNITY): Payer: BLUE CROSS/BLUE SHIELD | Admitting: Registered Nurse

## 2018-01-23 ENCOUNTER — Ambulatory Visit (HOSPITAL_COMMUNITY): Payer: BLUE CROSS/BLUE SHIELD

## 2018-01-23 ENCOUNTER — Ambulatory Visit (HOSPITAL_COMMUNITY)
Admission: RE | Admit: 2018-01-23 | Discharge: 2018-01-23 | Disposition: A | Discharge status: Home / Self Care | Payer: BLUE CROSS/BLUE SHIELD | Source: Ambulatory Visit | Attending: Podiatry | Admitting: Podiatry

## 2018-01-23 ENCOUNTER — Encounter (HOSPITAL_COMMUNITY)
Admission: RE | Disposition: A | Discharge status: Home / Self Care | Payer: Self-pay | Source: Ambulatory Visit | Attending: Podiatry

## 2018-01-23 DIAGNOSIS — Z6841 Body Mass Index (BMI) 40.0 and over, adult: Secondary | ICD-10-CM | POA: Insufficient documentation

## 2018-01-23 DIAGNOSIS — F419 Anxiety disorder, unspecified: Secondary | ICD-10-CM | POA: Diagnosis not present

## 2018-01-23 DIAGNOSIS — E559 Vitamin D deficiency, unspecified: Secondary | ICD-10-CM | POA: Insufficient documentation

## 2018-01-23 DIAGNOSIS — L600 Ingrowing nail: Secondary | ICD-10-CM | POA: Diagnosis not present

## 2018-01-23 DIAGNOSIS — L603 Nail dystrophy: Secondary | ICD-10-CM | POA: Diagnosis not present

## 2018-01-23 DIAGNOSIS — Z79890 Hormone replacement therapy (postmenopausal): Secondary | ICD-10-CM | POA: Insufficient documentation

## 2018-01-23 DIAGNOSIS — E039 Hypothyroidism, unspecified: Secondary | ICD-10-CM | POA: Insufficient documentation

## 2018-01-23 DIAGNOSIS — G4730 Sleep apnea, unspecified: Secondary | ICD-10-CM | POA: Insufficient documentation

## 2018-01-23 DIAGNOSIS — J45909 Unspecified asthma, uncomplicated: Secondary | ICD-10-CM | POA: Insufficient documentation

## 2018-01-23 DIAGNOSIS — F329 Major depressive disorder, single episode, unspecified: Secondary | ICD-10-CM | POA: Insufficient documentation

## 2018-01-23 DIAGNOSIS — M2042 Other hammer toe(s) (acquired), left foot: Secondary | ICD-10-CM | POA: Insufficient documentation

## 2018-01-23 DIAGNOSIS — Z7951 Long term (current) use of inhaled steroids: Secondary | ICD-10-CM | POA: Insufficient documentation

## 2018-01-23 DIAGNOSIS — Z9889 Other specified postprocedural states: Secondary | ICD-10-CM

## 2018-01-23 DIAGNOSIS — Z79899 Other long term (current) drug therapy: Secondary | ICD-10-CM | POA: Insufficient documentation

## 2018-01-23 DIAGNOSIS — K219 Gastro-esophageal reflux disease without esophagitis: Secondary | ICD-10-CM | POA: Diagnosis not present

## 2018-01-23 DIAGNOSIS — M21542 Acquired clubfoot, left foot: Secondary | ICD-10-CM | POA: Diagnosis not present

## 2018-01-23 DIAGNOSIS — M7752 Other enthesopathy of left foot: Secondary | ICD-10-CM | POA: Diagnosis not present

## 2018-01-23 HISTORY — PX: METATARSAL OSTEOTOMY: SHX1641

## 2018-01-23 HISTORY — PX: HAMMER TOE SURGERY: SHX385

## 2018-01-23 HISTORY — PX: CAPSULOTOMY: SHX379

## 2018-01-23 SURGERY — OSTEOTOMY, METATARSAL BONE
Anesthesia: Monitor Anesthesia Care | Site: Foot | Laterality: Right

## 2018-01-23 MED ORDER — LIDOCAINE 2% (20 MG/ML) 5 ML SYRINGE
INTRAMUSCULAR | Status: AC
  Filled 2018-01-23: qty 10

## 2018-01-23 MED ORDER — CEFAZOLIN SODIUM-DEXTROSE 2-4 GM/100ML-% IV SOLN: 2.0000 g | INTRAVENOUS | Status: DC

## 2018-01-23 MED ORDER — DEXAMETHASONE SODIUM PHOSPHATE 10 MG/ML IJ SOLN
INTRAMUSCULAR | Status: AC
  Filled 2018-01-23: qty 1

## 2018-01-23 MED ORDER — LACTATED RINGERS IV SOLN
INTRAVENOUS | Status: DC
  Administered 2018-01-23: 10:00:00 via INTRAVENOUS

## 2018-01-23 MED ORDER — ROCURONIUM BROMIDE 10 MG/ML (PF) SYRINGE
PREFILLED_SYRINGE | INTRAVENOUS | Status: AC
  Filled 2018-01-23: qty 10

## 2018-01-23 MED ORDER — SUCCINYLCHOLINE CHLORIDE 200 MG/10ML IV SOSY
PREFILLED_SYRINGE | INTRAVENOUS | Status: AC
  Filled 2018-01-23: qty 20

## 2018-01-23 MED ORDER — PROPOFOL 500 MG/50ML IV EMUL
INTRAVENOUS | Status: DC | PRN
  Administered 2018-01-23: 75 ug/kg/min via INTRAVENOUS

## 2018-01-23 MED ORDER — LIDOCAINE HCL (PF) 1 % IJ SOLN
INTRAMUSCULAR | Status: AC
  Filled 2018-01-23: qty 30

## 2018-01-23 MED ORDER — PROMETHAZINE HCL 25 MG/ML IJ SOLN: 6.2500 mg | INTRAMUSCULAR | Status: DC | PRN

## 2018-01-23 MED ORDER — MIDAZOLAM HCL 2 MG/2ML IJ SOLN
INTRAMUSCULAR | Status: AC
  Filled 2018-01-23: qty 2

## 2018-01-23 MED ORDER — MIDAZOLAM HCL 5 MG/5ML IJ SOLN
INTRAMUSCULAR | Status: DC | PRN
  Administered 2018-01-23: 2 mg via INTRAVENOUS

## 2018-01-23 MED ORDER — BUPIVACAINE HCL (PF) 0.5 % IJ SOLN
INTRAMUSCULAR | Status: DC | PRN
  Administered 2018-01-23: 15 mL
  Administered 2018-01-23: 20 mL

## 2018-01-23 MED ORDER — LIDOCAINE HCL (PF) 1 % IJ SOLN
INTRAMUSCULAR | Status: DC | PRN
  Administered 2018-01-23: 15 mL

## 2018-01-23 MED ORDER — ONDANSETRON HCL 4 MG/2ML IJ SOLN
INTRAMUSCULAR | Status: DC | PRN
  Administered 2018-01-23: 4 mg via INTRAVENOUS

## 2018-01-23 MED ORDER — CHLORHEXIDINE GLUCONATE 4 % EX LIQD: 60.0000 mL | Freq: Once | CUTANEOUS | Status: DC

## 2018-01-23 MED ORDER — EPHEDRINE 5 MG/ML INJ
INTRAVENOUS | Status: AC
  Filled 2018-01-23: qty 10

## 2018-01-23 MED ORDER — DEXTROSE 5 % IV SOLN
3.0000 g | INTRAVENOUS | Status: AC
  Administered 2018-01-23: 3 g via INTRAVENOUS
  Filled 2018-01-23: qty 3

## 2018-01-23 MED ORDER — PROPOFOL 10 MG/ML IV BOLUS
INTRAVENOUS | Status: AC
  Filled 2018-01-23: qty 20

## 2018-01-23 MED ORDER — FENTANYL CITRATE (PF) 100 MCG/2ML IJ SOLN: 25.0000 ug | INTRAMUSCULAR | Status: DC | PRN

## 2018-01-23 MED ORDER — SUGAMMADEX SODIUM 200 MG/2ML IV SOLN
INTRAVENOUS | Status: AC
  Filled 2018-01-23: qty 2

## 2018-01-23 MED ORDER — DEXAMETHASONE SODIUM PHOSPHATE 10 MG/ML IJ SOLN
INTRAMUSCULAR | Status: DC | PRN
  Administered 2018-01-23: 10 mg via INTRAVENOUS

## 2018-01-23 MED ORDER — ONDANSETRON HCL 4 MG/2ML IJ SOLN
INTRAMUSCULAR | Status: AC
  Filled 2018-01-23: qty 2

## 2018-01-23 MED ORDER — FENTANYL CITRATE (PF) 250 MCG/5ML IJ SOLN
INTRAMUSCULAR | Status: AC
Start: 2018-01-23 — End: ?
  Filled 2018-01-23: qty 5

## 2018-01-23 MED ORDER — 0.9 % SODIUM CHLORIDE (POUR BTL) OPTIME
TOPICAL | Status: DC | PRN
  Administered 2018-01-23: 1000 mL

## 2018-01-23 MED ORDER — FENTANYL CITRATE (PF) 100 MCG/2ML IJ SOLN
INTRAMUSCULAR | Status: DC | PRN
  Administered 2018-01-23 (×2): 25 ug via INTRAVENOUS

## 2018-01-23 MED ORDER — BUPIVACAINE HCL (PF) 0.5 % IJ SOLN
INTRAMUSCULAR | Status: AC
  Filled 2018-01-23: qty 30

## 2018-01-23 MED ORDER — MEPERIDINE HCL 50 MG/ML IJ SOLN: 6.2500 mg | INTRAMUSCULAR | Status: DC | PRN

## 2018-01-23 SURGICAL SUPPLY — 53 items
BAG ZIPLOCK 12X15 (MISCELLANEOUS) ×4 IMPLANT
BANDAGE ACE 4X5 VEL STRL LF (GAUZE/BANDAGES/DRESSINGS) ×8 IMPLANT
BLADE OSCILLATING/SAGITTAL (BLADE) ×2
BLADE SURG 15 STRL LF DISP TIS (BLADE) ×4 IMPLANT
BLADE SURG 15 STRL SS (BLADE) ×4
BLADE SURG SZ10 CARB STEEL (BLADE) ×4 IMPLANT
BLADE SW THK.38XMED LNG THN (BLADE) ×2 IMPLANT
BNDG COHESIVE 3X5 TAN STRL LF (GAUZE/BANDAGES/DRESSINGS) ×4 IMPLANT
BNDG CONFORM 3 STRL LF (GAUZE/BANDAGES/DRESSINGS) ×4 IMPLANT
BNDG CONFORM 4 STRL LF (GAUZE/BANDAGES/DRESSINGS) ×8 IMPLANT
COTTON BALL STERILE (GAUZE/BANDAGES/DRESSINGS) ×4
COTTON BALL STERILE 2 PK (GAUZE/BANDAGES/DRESSINGS) ×2 IMPLANT
COVER SURGICAL LIGHT HANDLE (MISCELLANEOUS) ×4 IMPLANT
CUFF TOURN SGL QUICK 44 (TOURNIQUET CUFF) ×4 IMPLANT
DRSG EMULSION OIL 3X3 NADH (GAUZE/BANDAGES/DRESSINGS) ×4 IMPLANT
DURAPREP 26ML APPLICATOR (WOUND CARE) ×4 IMPLANT
ELECT REM PT RETURN 15FT ADLT (MISCELLANEOUS) ×4 IMPLANT
GAUZE 4X4 16PLY RFD (DISPOSABLE) ×4 IMPLANT
GAUZE SPONGE 2X2 8PLY STRL LF (GAUZE/BANDAGES/DRESSINGS) ×2 IMPLANT
GAUZE SPONGE 4X4 12PLY STRL (GAUZE/BANDAGES/DRESSINGS) ×8 IMPLANT
GAUZE XEROFORM 1X8 LF (GAUZE/BANDAGES/DRESSINGS) ×8 IMPLANT
GLOVE BIOGEL PI IND STRL 7.5 (GLOVE) ×4 IMPLANT
GLOVE BIOGEL PI IND STRL 8.5 (GLOVE) ×2 IMPLANT
GLOVE BIOGEL PI INDICATOR 7.5 (GLOVE) ×4
GLOVE BIOGEL PI INDICATOR 8.5 (GLOVE) ×2
GLOVE SURG SS PI 6.5 STRL IVOR (GLOVE) ×4 IMPLANT
GLOVE SURG SS PI 7.0 STRL IVOR (GLOVE) ×4 IMPLANT
GLOVE SURG SS PI 8.0 STRL IVOR (GLOVE) ×4 IMPLANT
GOWN STRL REUS W/ TWL LRG LVL3 (GOWN DISPOSABLE) ×2 IMPLANT
GOWN STRL REUS W/TWL LRG LVL3 (GOWN DISPOSABLE) ×2
GOWN STRL REUS W/TWL XL LVL3 (GOWN DISPOSABLE) ×8 IMPLANT
K-WIRE CAPS BLUE STERILE .035 (WIRE)
K-WIRE CAPS STERILE WHITE .045 (WIRE) ×4 IMPLANT
K-WIRE CAPS STERILE YELLOW .02 (WIRE)
K-WIRE SMTH SNGL TROCAR .028X4 (WIRE)
KIT BASIN OR (CUSTOM PROCEDURE TRAY) ×4 IMPLANT
KWIRE 4.0 X .045IN (WIRE) IMPLANT
KWIRE 4.0 X .062IN (WIRE) IMPLANT
KWIRE CAPS BLUE STRL .035 (WIRE) IMPLANT
KWIRE CAPS STRL YELLOW .02 (WIRE) IMPLANT
KWIRE SMTH SNGL TROCAR .028X4 (WIRE) IMPLANT
MANIFOLD NEPTUNE II (INSTRUMENTS) ×4 IMPLANT
PACK ORTHO EXTREMITY (CUSTOM PROCEDURE TRAY) ×4 IMPLANT
PAD CAST 4YDX4 CTTN HI CHSV (CAST SUPPLIES) ×2 IMPLANT
PADDING CAST COTTON 4X4 STRL (CAST SUPPLIES) ×2
PIN CAPS ORTHO GREEN .062 (PIN) IMPLANT
POSITIONER SURGICAL ARM (MISCELLANEOUS) ×4 IMPLANT
SPONGE GAUZE 2X2 STER 10/PKG (GAUZE/BANDAGES/DRESSINGS) ×2
SUT ETHILON 4 0 PS 2 18 (SUTURE) ×4 IMPLANT
SUT PROLENE 4 0 PS 2 18 (SUTURE) ×4 IMPLANT
SUT VIC AB 4-0 PS2 18 (SUTURE) ×4 IMPLANT
SUT VICRYL 0 27 CT2 27 ABS (SUTURE) ×8 IMPLANT
TOWEL OR 17X26 10 PK STRL BLUE (TOWEL DISPOSABLE) ×8 IMPLANT

## 2018-01-23 NOTE — H&P (Signed)
Anesthesia H&P Update: History and Physical Exam reviewed; patient is OK for planned anesthetic and procedure. ? ?

## 2018-01-23 NOTE — Interval H&P Note (Signed)
History and Physical Interval Note:  01/23/2018 12:41 PM  Claudia Campbell  has presented today for surgery, with the diagnosis of plantar flex metatrasal  hammertoe  ingrown toenail  The various methods of treatment have been discussed with the patient and family. After consideration of risks, benefits and other options for treatment, the patient has consented to  Procedure(s): METATARSAL OSTEOTOMY SECOND LEFT (Left) HAMMER TOE CORRECTION SECOND LEFT (Left) CAPSULOTOMY SECOND LEFT (Left) EXCISION OF NAIL PERM 1 THRU 4 (N/A) as a surgical intervention .  The patient's history has been reviewed, patient examined, no change in status, stable for surgery.  I have reviewed the patient's chart and labs.  Questions were answered to the patient's satisfaction.     Edrick Kins

## 2018-01-23 NOTE — Transfer of Care (Signed)
Immediate Anesthesia Transfer of Care Note  Patient: Claudia Campbell  Procedure(s) Performed: METATARSAL OSTEOTOMY SECOND LEFT (Left Foot) HAMMER TOE CORRECTION SECOND LEFT (Left Foot) CAPSULOTOMY SECOND LEFT (Left Foot) EXCISION OF NAIL PERM 1 THRU 4 (Right Foot)  Patient Location: PACU  Anesthesia Type:MAC  Level of Consciousness: awake, alert , oriented and patient cooperative  Airway & Oxygen Therapy: Patient Spontanous Breathing and Patient connected to face mask oxygen  Post-op Assessment: Report given to RN, Post -op Vital signs reviewed and stable and Patient moving all extremities  Post vital signs: Reviewed and stable  Last Vitals:  Vitals Value Taken Time  BP 122/63 01/23/2018  2:15 PM  Temp    Pulse 73 01/23/2018  2:16 PM  Resp 16 01/23/2018  2:16 PM  SpO2 96 % 01/23/2018  2:16 PM  Vitals shown include unvalidated device data.  Last Pain:  Vitals:   01/23/18 1003  TempSrc:   PainSc: 0-No pain      Patients Stated Pain Goal: 4 (59/27/63 9432)  Complications: No apparent anesthesia complications

## 2018-01-23 NOTE — Anesthesia Preprocedure Evaluation (Signed)
Anesthesia Evaluation  Patient identified by MRN, date of birth, ID band Patient awake    Reviewed: Allergy & Precautions, NPO status , Patient's Chart, lab work & pertinent test results  History of Anesthesia Complications (+) PONV and history of anesthetic complications  Airway Mallampati: III  TM Distance: >3 FB Neck ROM: Full    Dental no notable dental hx.    Pulmonary asthma , sleep apnea (not using CPAP "because it is broken") , neg COPD,    Pulmonary exam normal breath sounds clear to auscultation       Cardiovascular (-) hypertension(-) Past MI and (-) CHF (-) dysrhythmias (-) Valvular Problems/Murmurs Rhythm:Regular Rate:Normal     Neuro/Psych neg Seizures Anxiety Depression    GI/Hepatic Neg liver ROS, GERD  Medicated and Poorly Controlled,  Endo/Other  Hypothyroidism Morbid obesity  Renal/GU Renal disease (stones)     Musculoskeletal   Abdominal   Peds  Hematology   Anesthesia Other Findings   Reproductive/Obstetrics                             Anesthesia Physical  Anesthesia Plan  ASA: III  Anesthesia Plan: MAC   Post-op Pain Management:    Induction: Intravenous  PONV Risk Score and Plan: 4 or greater and TIVA, Propofol infusion and Treatment may vary due to age or medical condition  Airway Management Planned: Simple Face Mask  Additional Equipment:   Intra-op Plan:   Post-operative Plan:   Informed Consent: I have reviewed the patients History and Physical, chart, labs and discussed the procedure including the risks, benefits and alternatives for the proposed anesthesia with the patient or authorized representative who has indicated his/her understanding and acceptance.   Dental advisory given  Plan Discussed with: CRNA  Anesthesia Plan Comments:         Anesthesia Quick Evaluation

## 2018-01-23 NOTE — Brief Op Note (Signed)
01/23/2018  2:04 PM  PATIENT:  Claudia Campbell  47 y.o. female  PRE-OPERATIVE DIAGNOSIS:  plantar flex metatrasal  hammertoe  ingrown toenail  POST-OPERATIVE DIAGNOSIS:  plantar flex metatrasal  hammertoe  ingrown toenail  PROCEDURE:  Procedure(s): METATARSAL OSTEOTOMY SECOND LEFT (Left) HAMMER TOE CORRECTION SECOND LEFT (Left) CAPSULOTOMY SECOND LEFT (Left) EXCISION OF NAIL PERM 1 THRU 4 (Right)  SURGEON:  Surgeon(s) and Role:    Edrick Kins, DPM - Primary  PHYSICIAN ASSISTANT:   ASSISTANTS: none   ANESTHESIA:   local and MAC  EBL:  Minimal   BLOOD ADMINISTERED:none  DRAINS: none   LOCAL MEDICATIONS USED:  MARCAINE    and LIDOCAINE   SPECIMEN:  No Specimen  DISPOSITION OF SPECIMEN:  N/A  COUNTS:  YES  TOURNIQUET:  * Missing tourniquet times found for documented tourniquets in log: [id number] * Total Tourniquet Time Documented: Calf (Left) - 36 minutes Total: Calf (Left) - 36 minutes   DICTATION: .Viviann Spare Dictation  PLAN OF CARE: Discharge to home after PACU  PATIENT DISPOSITION:  PACU - hemodynamically stable.   Delay start of Pharmacological VTE agent (>24hrs) due to surgical blood loss or risk of bleeding: not applicable

## 2018-01-24 ENCOUNTER — Telehealth: Payer: Self-pay | Admitting: Podiatry

## 2018-01-24 ENCOUNTER — Encounter (HOSPITAL_COMMUNITY): Payer: Self-pay | Admitting: Podiatry

## 2018-01-24 NOTE — Anesthesia Postprocedure Evaluation (Signed)
Anesthesia Post Note  Patient: Claudia Campbell  Procedure(s) Performed: METATARSAL OSTEOTOMY SECOND LEFT (Left Foot) HAMMER TOE CORRECTION SECOND LEFT (Left Foot) CAPSULOTOMY SECOND LEFT (Left Foot) EXCISION OF NAIL PERM 1 THRU 4 (Right Foot)     Patient location during evaluation: PACU Anesthesia Type: MAC Level of consciousness: awake and alert Pain management: pain level controlled Vital Signs Assessment: post-procedure vital signs reviewed and stable Respiratory status: spontaneous breathing Cardiovascular status: stable Anesthetic complications: no    Last Vitals:  Vitals:   01/23/18 1445 01/23/18 1548  BP: 129/69 140/90  Pulse: 68 73  Resp: 19 19  Temp: 36.6 C 36.6 C  SpO2: 96% 96%    Last Pain:  Vitals:   01/24/18 1108  TempSrc:   PainSc: 0-No pain                 Nolon Nations

## 2018-01-24 NOTE — Telephone Encounter (Signed)
I returned patient call and she only wanted to know if she could wash her foot that he removed the toenails on.  I informed her that  would be ok and to soak her foot twice daily in Epson salt and put Neosporin on toes and wrap with Band-Aid.  She verbalized understanding.  She will call if she has any other concerns or questions

## 2018-01-24 NOTE — Op Note (Signed)
OPERATIVE REPORT Patient name: Claudia Campbell MRN: [id number] DOB: 07-05-1971  DOS:  01/23/18  Preop Dx: Garner Nash 2nd LT. Dystrophic, symptomatic nails 1-4 RT.  Postop Dx: same  Procedure:  1. PIPJ arthroplasty 2nd LT 2. MTPJ capsulotomy 2nd LT 3. Metatarsal/Weil decompression osteotomy LT 4. Total permanent nail avulsions 1-4 RT  Surgeon: Edrick Kins DPM  Anesthesia: 50-50 mixture of 2% lidocaine plain with 0.5% Marcaine plain totaling 55mL infiltrated in the patient's RT lower extremity  Hemostasis: Ankle tourniquet inflated to a pressure of 234mmHg after esmarch exsanguination   EBL: 10 mL Materials: Synthes 2.74mm screw x 1 Injectables: none Pathology: none  Condition: The patient tolerated the procedure and anesthesia well. No complications noted or reported   Justification for procedure: The patient is a 47 y.o. female who presents today for surgical correction of symptomatic hammertoe 2nd LT and symptomatic dystrophic nails 1-4 RT foot. All conservative modalities of been unsuccessful in providing any sort of satisfactory alleviation of symptoms with the patient. The patient was told benefits as well as possible side effects of the surgery. The patient consented for surgical correction. The patient consent form was reviewed. All patient questions were answered. No guarantees were expressed or implied. The patient and the surgeon boson the patient consent form with the witness present and placed in the patient's chart.   Procedure in Detail: The patient was brought to the operating room, placed in the operating table in the supine position at which time an aseptic scrub and drape were performed about the patient's respective lower extremity after anesthesia was induced as described above. Attention was then directed to the surgical area where procedure number one commenced.  Procedure #1: PIPJ arthroplasty second digit left foot A 4 cm linear longitudinal skin incision  was planned and made overlying thesecond digit of the left foot. The incision was carried down to the level of the EDL tendon where a Z-plasty tenotomy was performed to expose the underlying joints. The proximal interphalangeal joint was exposed with the hypertrophic head of the proximal phalanx. The osteotomy was performed of the surgical phalangeal neck of the proximal phalanx and the head of the proximal phalanx was removed in toto. Kelikian maneuver indicated more proximal pathology continue to exist of the hammertoe deformity. Attention was then directed to the metatarsal phalangeal joint where procedure 2 commenced.  Procedure #2: MTPJ capsulotomy second left foot The incision previously mentioned procedure 1 was extended proximal to allow exposure of the MTPJ. At this time a dorsal capsulotomy was performed. McGlamry elevator was utilized to release the plantar apparatus. A second clicking maneuver indicated appropriate alleviation of symptoms the hammertoe. At this time the decision was made to address the elongated second metatarsal.  Procedure #3: Weil metatarsal osteotomy second metatarsal left foot Additional dissection was performed to allow exposure of the surgical phalangeal neck of the second metatarsal head left foot. This was performed within the previous mentioned incision site. An osteotomy was then performed using a sagittal blade mounted on sagittal saw in a dorsal distal to proximal plantar orientation. After the osteotomy the head of the second metatarsal was translocated proximal to decompress the second MTPJ. The osteotomy was held in place with a Synthes 2.0 mm cortical screw. Screw was inserted in standard AO fashion. Intraoperative x-ray fluoroscopy is utilized to visualize the placement of the orthopedic hardware and shortening of the second metatarsal which was satisfactory.  0.045 inch K wire was utilized for temporary percutaneous fixation. The K wire was  driven beginning at  the distal tuft of the second digit and driven proximal across the DIPJ, PIPJ, and MPJ of the second ray. The percutaneous portion of the wire was bent dorsally at a 90 angle and cut with a synthetic ball Placed of her continues portion.  The incision site was then copiously irrigated with normal saline and dried in preparation for routine layered soft tissue closure. 3-0 Vicryl sutures utilized to reapproximate subcutaneous change tissue and reapproximate the opposing ends of the EDL tenotomy which was performed. The opposing ends were reapproximated under normal physiologic tension. 4-0 Vicryl sutures utilized to reapproximate subcutaneous tissue followed by stainless still skin staples to approximate superficial skin edges.  Procedure #4: Total permanent nail avulsions 1-4 right foot After dry sterile dressings were applied to the left foot attention was directed to the right foot. The right ankle tourniquet was inflated after Esmarch bandage exsanguination and total permanent nail avulsion was performed using a combination of a nail splitter which was utilized to free all soft tissue surrounding the nail plate. After removal of the toenails 427 seconds applications of phenol was utilized with a cotton-tip applicator to all nailbeds 1-4 of the right foot. The underlying nail beds were then irrigated with normal saline and dry sterile dressing was applied to the right foot.  Dry sterile compressive dressings were then applied to all previously mentioned incision sites about the patient's lower extremity. The tourniquet which was used for hemostasis was deflated. All normal neurovascular responses including pink color and warmth returned all the digits of patient's lower extremity.  The patient was then transferred from the operating room to the recovery room having tolerated the procedure and anesthesia well. All vital signs are stable. After a brief stay in the recovery room the patient was discharged  with adequate prescriptions for analgesia. Verbal as well as written instructions were provided for the patient regarding wound care. The patient is to keep the dressings clean dry and intact until they are to follow surgeon Dr. Daylene Katayama in the office upon discharge.   Edrick Kins, DPM [facility]  Dr. Edrick Kins, [address] [place]                                        [place], [place] [address]                Office [phone]  Fax [phone]

## 2018-01-24 NOTE — Telephone Encounter (Signed)
Patient wants to know exactly what she needs to be doing once she removes the bandage on foot. Does she need to rinse it? If you can call her back at [phone]

## 2018-01-25 ENCOUNTER — Encounter: Payer: BLUE CROSS/BLUE SHIELD | Admitting: Podiatry

## 2018-01-29 ENCOUNTER — Telehealth: Payer: Self-pay | Admitting: Podiatry

## 2018-01-29 NOTE — Telephone Encounter (Signed)
Patient is having a itching and burning feeling in toes where the nails were removed. She wants to know if this is normal? If you can call patient back at [phone]

## 2018-01-30 NOTE — Telephone Encounter (Signed)
I returned patient call and she stated that her 4 toes from nail avulsions have been "burning like fire" since Sunday.  She stated that she was still soaking in Epson salt and using Neosporin on each toe. She stated that her great toe was red, but the other toes looked ok.   I informed her to stop with Epson salt and try soaking in Betadine or peroxide and if burning persist to call office tomorrow.  She verbalized understanding.   She has follow up appt with Dr. Amalia Hailey in 2 days

## 2018-02-01 ENCOUNTER — Ambulatory Visit (INDEPENDENT_AMBULATORY_CARE_PROVIDER_SITE_OTHER): Payer: BLUE CROSS/BLUE SHIELD

## 2018-02-01 ENCOUNTER — Ambulatory Visit (INDEPENDENT_AMBULATORY_CARE_PROVIDER_SITE_OTHER): Payer: BLUE CROSS/BLUE SHIELD | Admitting: Podiatry

## 2018-02-01 ENCOUNTER — Encounter: Payer: Self-pay | Admitting: Podiatry

## 2018-02-01 VITALS — BP 125/90 | HR 92 | Temp 98.7°F

## 2018-02-01 DIAGNOSIS — M2042 Other hammer toe(s) (acquired), left foot: Secondary | ICD-10-CM | POA: Diagnosis not present

## 2018-02-01 DIAGNOSIS — Z9889 Other specified postprocedural states: Secondary | ICD-10-CM

## 2018-02-01 DIAGNOSIS — M2041 Other hammer toe(s) (acquired), right foot: Secondary | ICD-10-CM

## 2018-02-01 MED ORDER — GENTAMICIN SULFATE 0.1 % EX CREA: 1.0000 "application " | TOPICAL_CREAM | Freq: Three times a day (TID) | CUTANEOUS | 1 refills | Status: DC

## 2018-02-01 MED ORDER — DOXYCYCLINE HYCLATE 100 MG PO TABS: 100.0000 mg | ORAL_TABLET | Freq: Two times a day (BID) | ORAL | 0 refills | Status: DC

## 2018-02-05 NOTE — Progress Notes (Signed)
   Subjective:  Patient presents today status post total permanent nail avulsions of the right foot digits 1-4 and hammertoe repair of the 2nd digit of the left foot. DOS: 01/23/18. She reports burning and redness to the toes of the right foot. She states the left foot is doing well. She states the pain is tolerable without taking the pain medication. She denies fever, chills, nausea or vomiting. Patient is here for further evaluation and treatment.    Past Medical History:  Diagnosis Date  . Abnormal liver function test   . Arthritis   . Asthma   . Barrett esophagus   . Depression   . First degree AV block   . GERD (gastroesophageal reflux disease)   . Hypothyroidism    Post ablation  . Migraines   . PONV (postoperative nausea and vomiting)   . Restless leg   . Sleep apnea    NO cpap at this time, broken  . Tenosynovitis   . Thyroid disease   . Thyroid nodule   . Vitamin D deficiency       Objective/Physical Exam Neurovascular status intact.  Skin incisions appear to be well coapted with sutures and staples intact. No sign of infectious process noted. No dehiscence. No active bleeding noted. Moderate edema noted to the surgical extremity.  Radiographic Exam:  Orthopedic hardware and osteotomies sites appear to be stable with routine healing.  Assessment: 1. s/p total permanent nail avulsion right digits 1-4 and hammertoe repair 2nd digit left. DOS: 01/23/18   Plan of Care:  1. Patient was evaluated. X-rays reviewed 2. Prescription for Doxycyline #20 provided to patient.  3. Prescription for Gentamicin cream provided to patient.  4. Dressing changed.  5. Continue weightbearing in CAM boot on the left foot. Post op shoe on the right.  6. Return to clinic in one week.    Edrick Kins, DPM [facility]  Dr. Edrick Kins, [place]                                        [place], [place] [address]                Office [phone]  Fax  [phone]

## 2018-02-08 NOTE — Progress Notes (Signed)
DOS 01/23/2018  Interphalangeal joint arthroscopy second digit left foot with metatarsal phalangeal joint capsulotomy left. Second metatarsal osteotomy left. Total permanent nail avulsions digit one through four right foot.

## 2018-02-12 ENCOUNTER — Encounter: Payer: Self-pay | Admitting: Podiatry

## 2018-02-12 ENCOUNTER — Ambulatory Visit (INDEPENDENT_AMBULATORY_CARE_PROVIDER_SITE_OTHER): Payer: BLUE CROSS/BLUE SHIELD | Admitting: Podiatry

## 2018-02-12 DIAGNOSIS — M2042 Other hammer toe(s) (acquired), left foot: Secondary | ICD-10-CM | POA: Diagnosis not present

## 2018-02-12 DIAGNOSIS — Z9889 Other specified postprocedural states: Secondary | ICD-10-CM

## 2018-02-15 NOTE — Progress Notes (Signed)
   Subjective:  Patient presents today status post total permanent nail avulsions of the right foot digits 1-4 and hammertoe repair of the 2nd digit of the left foot. DOS: 01/23/18. She states she is doing better and the pain is tolerable. She reports some erythema and tenderness to the right hallux. She has completed the course of Doxycycline this morning. Patient is here for further evaluation and treatment.    Past Medical History:  Diagnosis Date  . Abnormal liver function test   . Arthritis   . Asthma   . Barrett esophagus   . Depression   . First degree AV block   . GERD (gastroesophageal reflux disease)   . Hypothyroidism    Post ablation  . Migraines   . PONV (postoperative nausea and vomiting)   . Restless leg   . Sleep apnea    NO cpap at this time, broken  . Tenosynovitis   . Thyroid disease   . Thyroid nodule   . Vitamin D deficiency       Objective/Physical Exam Neurovascular status intact.  Skin incisions appear to be well coapted with sutures and staples intact. No sign of infectious process noted. No dehiscence. No active bleeding noted. Moderate edema noted to the surgical extremity.   Assessment: 1. s/p total permanent nail avulsion right digits 1-4 and hammertoe repair 2nd digit left. DOS: 01/23/18   Plan of Care:  1. Patient was evaluated.  2. Sutures removed.  3. Percutaneous pin removed.  4. Weightbearing in post op shoe on the left foot for 2 weeks.  5. Compression anklet dispensed.  6. Return to clinic in 4 weeks.    Edrick Kins, DPM [facility]  Dr. Edrick Kins, [address] [address]                                        [place], [place] [address]                Office [phone]  Fax [phone]

## 2018-02-25 ENCOUNTER — Other Ambulatory Visit: Payer: Self-pay | Admitting: Gastroenterology

## 2018-02-25 DIAGNOSIS — R1310 Dysphagia, unspecified: Secondary | ICD-10-CM

## 2018-03-01 ENCOUNTER — Ambulatory Visit
Admission: RE | Admit: 2018-03-01 | Discharge: 2018-03-01 | Disposition: A | Discharge status: Home / Self Care | Payer: BLUE CROSS/BLUE SHIELD | Source: Ambulatory Visit | Attending: Gastroenterology | Admitting: Gastroenterology

## 2018-03-01 DIAGNOSIS — R1310 Dysphagia, unspecified: Secondary | ICD-10-CM | POA: Insufficient documentation

## 2018-03-05 ENCOUNTER — Ambulatory Visit: Payer: BLUE CROSS/BLUE SHIELD

## 2018-03-08 ENCOUNTER — Ambulatory Visit (INDEPENDENT_AMBULATORY_CARE_PROVIDER_SITE_OTHER): Payer: BLUE CROSS/BLUE SHIELD | Admitting: Podiatry

## 2018-03-08 DIAGNOSIS — M2042 Other hammer toe(s) (acquired), left foot: Secondary | ICD-10-CM

## 2018-03-08 DIAGNOSIS — Z9889 Other specified postprocedural states: Secondary | ICD-10-CM

## 2018-03-11 NOTE — Progress Notes (Signed)
   Subjective:  Patient presents today status post total permanent nail avulsions of the right foot digits 1-4 and hammertoe repair of the 2nd digit of the left foot. DOS: 01/23/18. She states she has been doing well. She denies any pain or complaints at this time. Patient is here for further evaluation and treatment.    Past Medical History:  Diagnosis Date  . Abnormal liver function test   . Arthritis   . Asthma   . Barrett esophagus   . Depression   . First degree AV block   . GERD (gastroesophageal reflux disease)   . Hypothyroidism    Post ablation  . Migraines   . PONV (postoperative nausea and vomiting)   . Restless leg   . Sleep apnea    NO cpap at this time, broken  . Tenosynovitis   . Thyroid disease   . Thyroid nodule   . Vitamin D deficiency       Objective/Physical Exam Neurovascular status intact.  Skin incisions appear to be well coapted. No sign of infectious process noted. No dehiscence. No active bleeding noted. Moderate edema noted to the surgical extremity.   Assessment: 1. s/p total permanent nail avulsion right digits 1-4 and hammertoe repair 2nd digit left. DOS: 01/23/18   Plan of Care:  1. Patient was evaluated.  2. Recommended good shoe gear.  3. May resume full activity with no restrictions.  4. Return to clinic as needed.     Edrick Kins, DPM [facility]  Dr. Edrick Kins, [address]                                        [place], [place] [address]                Office [phone]  Fax [phone]

## 2018-05-28 ENCOUNTER — Encounter: Payer: Self-pay | Admitting: Podiatry

## 2018-05-28 ENCOUNTER — Other Ambulatory Visit: Payer: Self-pay | Admitting: Podiatry

## 2018-05-28 ENCOUNTER — Ambulatory Visit (INDEPENDENT_AMBULATORY_CARE_PROVIDER_SITE_OTHER): Payer: BLUE CROSS/BLUE SHIELD | Admitting: Podiatry

## 2018-05-28 ENCOUNTER — Ambulatory Visit (INDEPENDENT_AMBULATORY_CARE_PROVIDER_SITE_OTHER): Payer: BLUE CROSS/BLUE SHIELD

## 2018-05-28 DIAGNOSIS — M76821 Posterior tibial tendinitis, right leg: Secondary | ICD-10-CM

## 2018-05-28 DIAGNOSIS — M79671 Pain in right foot: Secondary | ICD-10-CM

## 2018-05-31 NOTE — Progress Notes (Signed)
    HPI: 47 year old female presenting today with a chief complaint of pain to the medial arch, heel and lateral right leg pain that began 2-3 weeks ago. She states she has had tendon issues with this leg in the past and is now having difficulty ambulating without pain. Walking and standing increases the pain. She has been taking OTC Motrin for the pain. Patient is here for further evaluation and treatment.   Past Medical History:  Diagnosis Date  . Abnormal liver function test   . Arthritis   . Asthma   . Barrett esophagus   . Depression   . First degree AV block   . GERD (gastroesophageal reflux disease)   . Hypothyroidism    Post ablation  . Migraines   . PONV (postoperative nausea and vomiting)   . Restless leg   . Sleep apnea    NO cpap at this time, broken  . Tenosynovitis   . Thyroid disease   . Thyroid nodule   . Vitamin D deficiency        Physical Exam: General: The patient is alert and oriented x3 in no acute distress.  Dermatology: Skin is warm, dry and supple bilateral lower extremities. Negative for open lesions or macerations.  Vascular: Palpable pedal pulses bilaterally. No edema or erythema noted. Capillary refill within normal limits.  Neurological: Epicritic and protective threshold grossly intact bilaterally.   Musculoskeletal Exam: Pain on palpation noted to the posterior tibial tendon of the right foot. Range of motion within normal limits. Muscle strength 5/5 in all muscle groups bilateral lower extremities.  Radiographic Exam:  Normal osseous mineralization. Joint spaces preserved. No fracture or dislocation identified.    Assessment: 1. Insertional posterior tibial tendinitis right   Plan of Care:  1. Patient was evaluated. Radiographs were reviewed today. 2. Injection of 0.5 mL Celestone Soluspan injected into the posterior tibial tendon sheath.  3. Continue OTC Motrin as needed for pain.  4. Plantar fascial brace dispensed.  5. Return to  clinic in 4 weeks.    Edrick Kins, DPM [facility]  Dr. Edrick Kins, [place]                                        [place], [place] [address]                Office [phone]  Fax [phone]

## 2018-12-03 ENCOUNTER — Encounter: Payer: Self-pay | Admitting: Podiatry

## 2018-12-03 ENCOUNTER — Ambulatory Visit (INDEPENDENT_AMBULATORY_CARE_PROVIDER_SITE_OTHER): Payer: BLUE CROSS/BLUE SHIELD

## 2018-12-03 ENCOUNTER — Other Ambulatory Visit: Payer: Self-pay

## 2018-12-03 ENCOUNTER — Ambulatory Visit (INDEPENDENT_AMBULATORY_CARE_PROVIDER_SITE_OTHER): Payer: BLUE CROSS/BLUE SHIELD | Admitting: Podiatry

## 2018-12-03 VITALS — Temp 97.6°F

## 2018-12-03 DIAGNOSIS — S97102A Crushing injury of unspecified left toe(s), initial encounter: Secondary | ICD-10-CM

## 2018-12-09 NOTE — Progress Notes (Signed)
   HPI: Established patient presents to the office after dropping a 50 pound weight on her left great toe and second toe this morning.  It is very painful to walk.  She is concerned about her postoperative repair to the left second toe.  She had surgery on this a few months back.  Currently the toe is slightly deviated and not in rectus alignment.  This occurred after the injury this morning.  She presents for further treatment and evaluation  Past Medical History:  Diagnosis Date  . Abnormal liver function test   . Arthritis   . Asthma   . Barrett esophagus   . Depression   . First degree AV block   . GERD (gastroesophageal reflux disease)   . Hypothyroidism    Post ablation  . Migraines   . PONV (postoperative nausea and vomiting)   . Restless leg   . Sleep apnea    NO cpap at this time, broken  . Tenosynovitis   . Thyroid disease   . Thyroid nodule   . Vitamin D deficiency      Physical Exam: General: The patient is alert and oriented x3 in no acute distress.  Dermatology: Skin is warm, dry and supple bilateral lower extremities. Negative for open lesions or macerations.  Vascular: Palpable pedal pulses bilaterally. No edema or erythema noted. Capillary refill within normal limits.  Neurological: Epicritic and protective threshold grossly intact bilaterally.   Musculoskeletal Exam: Range of motion within normal limits to all pedal and ankle joints bilateral. Muscle strength 5/5 in all groups bilateral.   Radiographic Exam:  Normal osseous mineralization. Joint spaces preserved. No fracture/dislocation/boney destruction.    Assessment: 1.  Left second toe injury with slight malalignment at the MPJ, negative for fracture   Plan of Care:  1. Patient evaluated. X-Rays reviewed.  2.  Injection of 3 cc of 2% lidocaine plain was infiltrated into the second toe in a digital block fashion followed by manipulation to try to move the toe in a more rectus alignment. 3.  Darco toe  splint was applied to keep the toe down in a rectus alignment during healing 4.  Resume wearing a postoperative shoe.  Weightbearing as tolerated.  X3 weeks 5.  Return to clinic as needed      Edrick Kins, DPM [facility]  Dr. Edrick Kins, DPM    [address], [place] [address]                Office [phone]  Fax [phone]

## 2019-04-11 ENCOUNTER — Encounter: Payer: Self-pay | Admitting: Podiatry

## 2019-04-11 ENCOUNTER — Other Ambulatory Visit: Payer: Self-pay

## 2019-04-11 ENCOUNTER — Ambulatory Visit: Payer: BLUE CROSS/BLUE SHIELD | Admitting: Podiatry

## 2019-04-11 DIAGNOSIS — L409 Psoriasis, unspecified: Secondary | ICD-10-CM

## 2019-04-11 DIAGNOSIS — M79676 Pain in unspecified toe(s): Secondary | ICD-10-CM

## 2019-04-11 MED ORDER — GENTAMICIN SULFATE 0.1 % EX CREA: 1.0000 "application " | TOPICAL_CREAM | Freq: Two times a day (BID) | CUTANEOUS | 1 refills | Status: DC

## 2019-04-11 MED ORDER — DOXYCYCLINE HYCLATE 100 MG PO TABS: 100.0000 mg | ORAL_TABLET | Freq: Two times a day (BID) | ORAL | 0 refills | Status: DC

## 2019-04-11 NOTE — Progress Notes (Signed)
   HPI: 48 y.o. female h/o psoriatic nails presents for symptoms pertaining to painful, pealing nails of the left foot. Patient has a history of total permanent nail avulsions to the digits 1-4 of the right foot. Patient would like to have her remaining toenails permanently removed. No other complaints at this time  Past Medical History:  Diagnosis Date  . Abnormal liver function test   . Arthritis   . Asthma   . Barrett esophagus   . Depression   . First degree AV block   . GERD (gastroesophageal reflux disease)   . Hypothyroidism    Post ablation  . Migraines   . PONV (postoperative nausea and vomiting)   . Restless leg   . Sleep apnea    NO cpap at this time, broken  . Tenosynovitis   . Thyroid disease   . Thyroid nodule   . Vitamin D deficiency      Physical Exam: General: The patient is alert and oriented x3 in no acute distress.  Dermatology: Skin is warm, dry and supple bilateral lower extremities. Negative for open lesions or macerations.  Thinning nails noted to the remaining digits 1-5 left foot and fifth toe right foot.  There is some splitting of the nail is also noted consistent with psoriatic type nails.  Complete loss of nails 1-4 right foot consistent with a history of total permanent nail avulsions.  Vascular: Palpable pedal pulses bilaterally. No edema or erythema noted. Capillary refill within normal limits.  Neurological: Epicritic and protective threshold grossly intact bilaterally.   Musculoskeletal Exam: Range of motion within normal limits to all pedal and ankle joints bilateral. Muscle strength 5/5 in all groups bilateral.   Assessment: 1.  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 Patient evaluated.   2.  Today we discussed treatment options and the patient would like to have her nails permanently removed.  The patient has a positive history with the total permanent nail avulsions and it seems to alleviate her symptoms.  Today we will  proceed with total permanent nail avulsions of 1-5 left and 5 right. 3.  Toes were prepped in aseptic manner and local anesthesia was utilized.  Total permanent nail avulsions were performed followed by 3 x 45-GYBWLS applications of phenol to each nail bed.  Alcohol flush utilized and dry sterile dressing was applied with post care per his instructions. 4.  Postoperative shoe dispensed left foot 5.  Prescription for doxycycline 100 mg twice daily #20 6.  Prescription for gentamicin cream applied 2 times daily 7.  Return to clinic in 3 weeks      Edrick Kins, DPM [facility]  Dr. Edrick Kins, DPM    [address], [place] [address]                Office [phone]  Fax [phone]

## 2019-05-02 ENCOUNTER — Encounter: Payer: Self-pay | Admitting: Podiatry

## 2019-05-02 ENCOUNTER — Other Ambulatory Visit: Payer: Self-pay

## 2019-05-02 ENCOUNTER — Ambulatory Visit (INDEPENDENT_AMBULATORY_CARE_PROVIDER_SITE_OTHER): Payer: BC Managed Care – PPO | Admitting: Podiatry

## 2019-05-02 DIAGNOSIS — M79676 Pain in unspecified toe(s): Secondary | ICD-10-CM | POA: Diagnosis not present

## 2019-05-02 DIAGNOSIS — L409 Psoriasis, unspecified: Secondary | ICD-10-CM

## 2019-05-05 NOTE — Progress Notes (Signed)
   Subjective: 48 y.o. female presenting today status post total permanent nail avulsion procedure of nails 1-5 of the left foot and nail 5 of the right foot that was performed on 04/11/2019. She states she is doing well. She has been applying Gentamicin cream as directed. She denies any modifying factors. Patient is here for further evaluation and treatment.    Past Medical History:  Diagnosis Date  . Abnormal liver function test   . Arthritis   . Asthma   . Barrett esophagus   . Depression   . First degree AV block   . GERD (gastroesophageal reflux disease)   . Hypothyroidism    Post ablation  . Migraines   . PONV (postoperative nausea and vomiting)   . Restless leg   . Sleep apnea    NO cpap at this time, broken  . Tenosynovitis   . Thyroid disease   . Thyroid nodule   . Vitamin D deficiency      Objective: Skin is warm, dry and supple. Nail bed and respective nail fold appears to be healing appropriately. Open wound to the associated nail fold with a granular wound base and moderate amount of fibrotic tissue. Minimal drainage noted. Mild erythema around the periungual region likely due to phenol chemical matricectomy.  Assessment: #1 postop permanent total nail avulsion digits 1-5 left; digit 5 right   Plan of care: #1 patient was evaluated  #2 light debridement of open wound was performed to the periungual border of the respective toe using a currette and tissue nipper. Antibiotic ointment and Band-Aid was applied. #3 Continue using Gentamicin cream as needed.  #4 patient is to return to clinic on a PRN  basis.   Edrick Kins, DPM [facility]  Dr. Edrick Kins, DPM    [address], [place] [address]                Office [phone]  Fax [phone]

## 2019-05-29 IMAGING — CT CT HEAD W/O CM
4 series · 16 of 47 positions shown, 18 images · non-contrast
Comparison: None.

CLINICAL DATA: Status post fall, hitting back of head on gas pack.
Dizziness and photosensitivity. Loss of consciousness. Decreased
sensation at the left hand.

EXAM:
CT HEAD WITHOUT CONTRAST
TECHNIQUE: Contiguous axial images were obtained from the base of the skull
through the vertex without intravenous contrast.

[Series 2: head wo · axial · 0.44mm/px · z∈[-146,-26]mm · 7 of 32 slices shown, 9 images]
[im 4/32  brain]
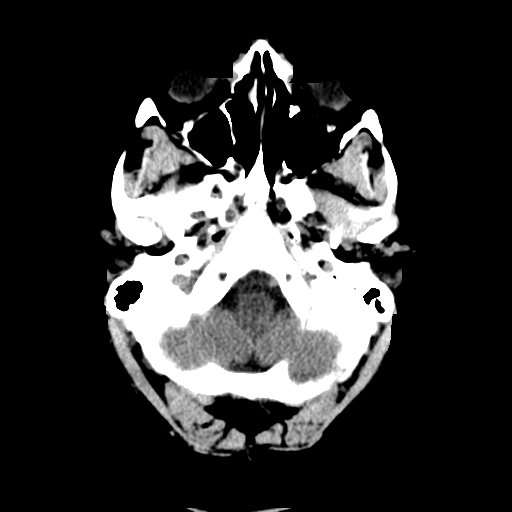
[im 4/32  bone]
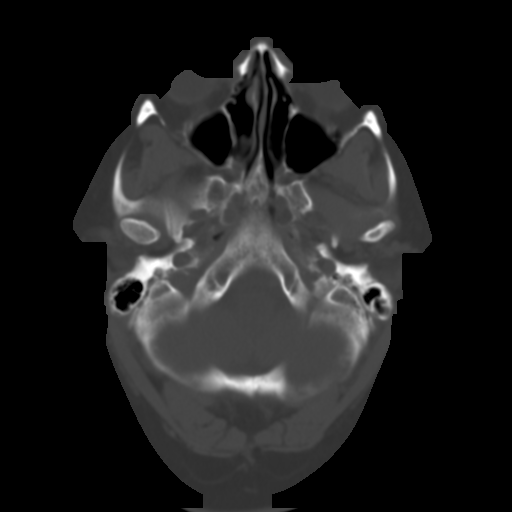
[im 8/32  brain]
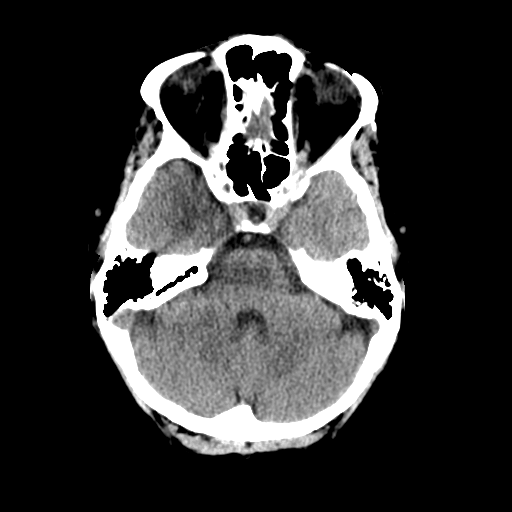
[im 12/32  brain]
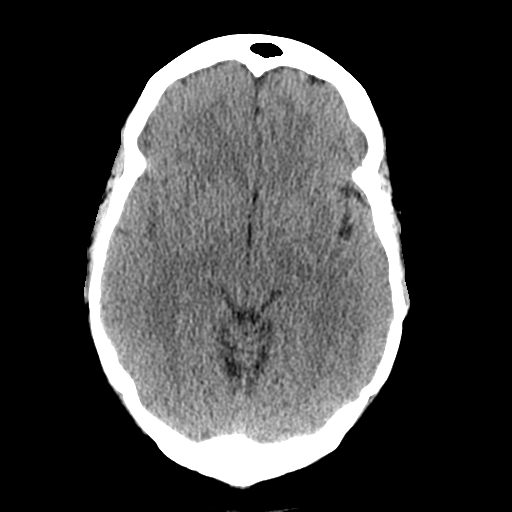
[im 16/32  brain]
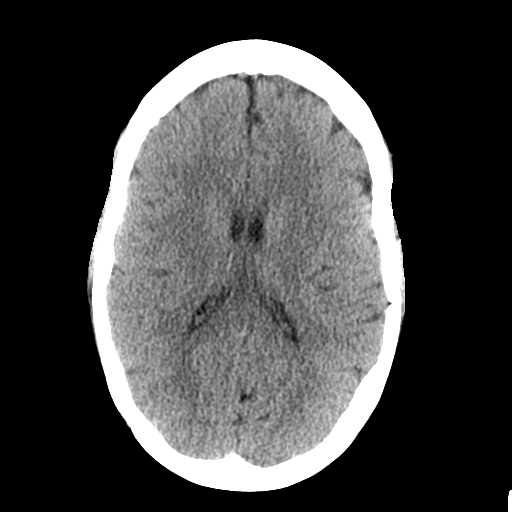
[im 20/32  brain]
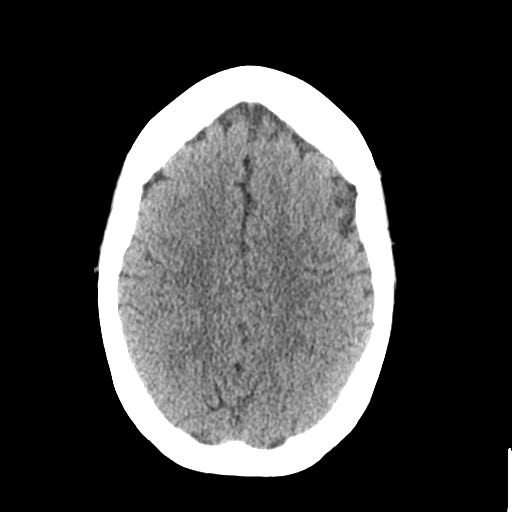
[im 20/32  bone]
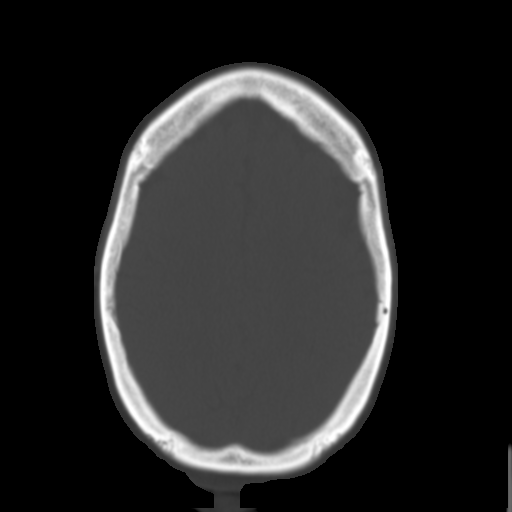
[im 24/32  brain]
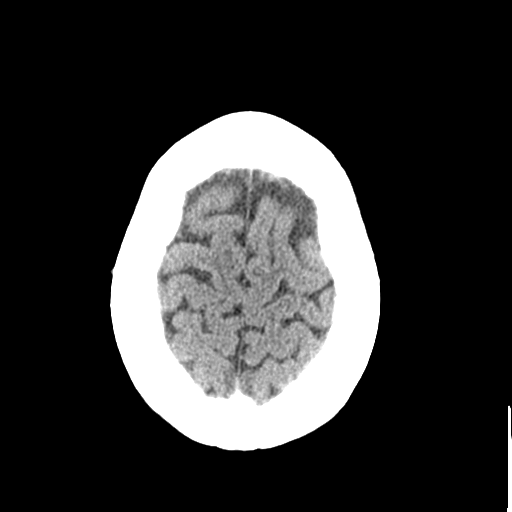
[im 28/32  brain]
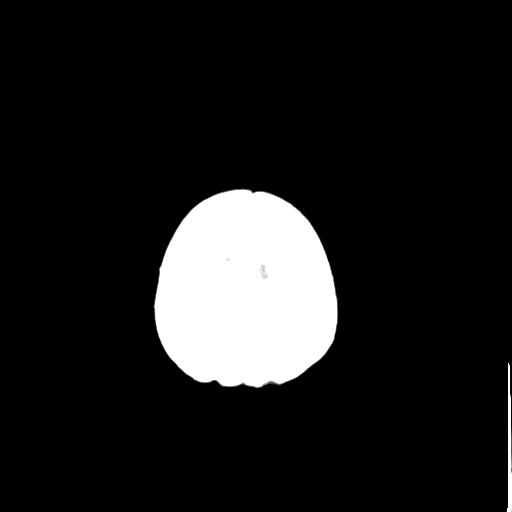

[Series 3: head bone · axial · 0.44mm/px · z∈[-147,-115]mm · 3 of 79 slices shown]
[im 8/79  bone]
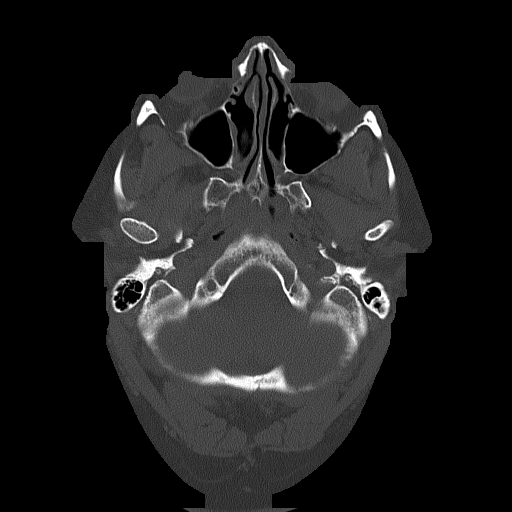
[im 16/79  bone]
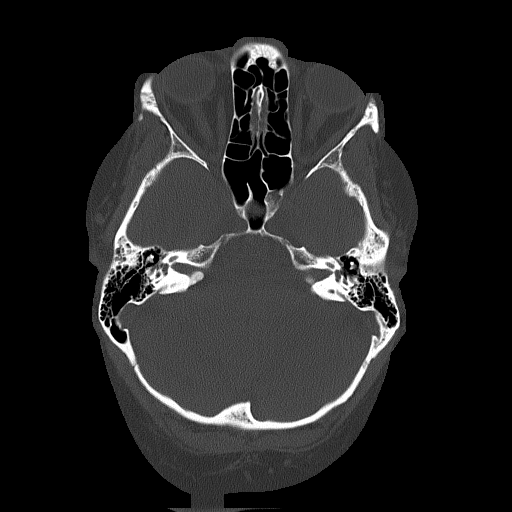
[im 24/79  bone]
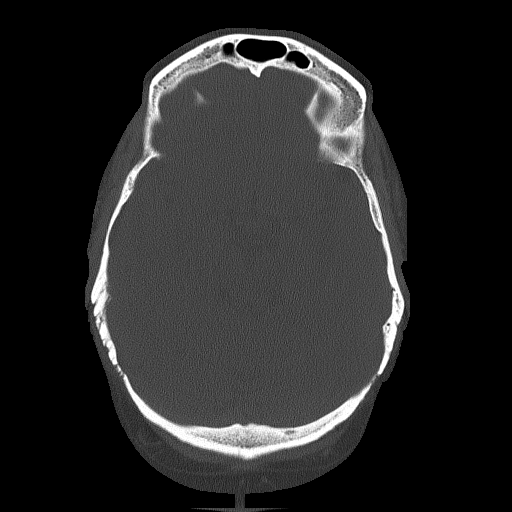

[Series 4: coronal soft tissue · coronal · 0.31mm/px · 3 of 70 slices shown]
[im 24/70  brain]
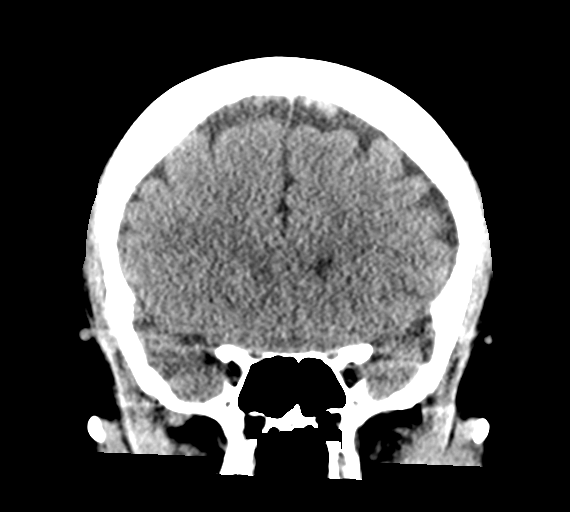
[im 31/70  brain]
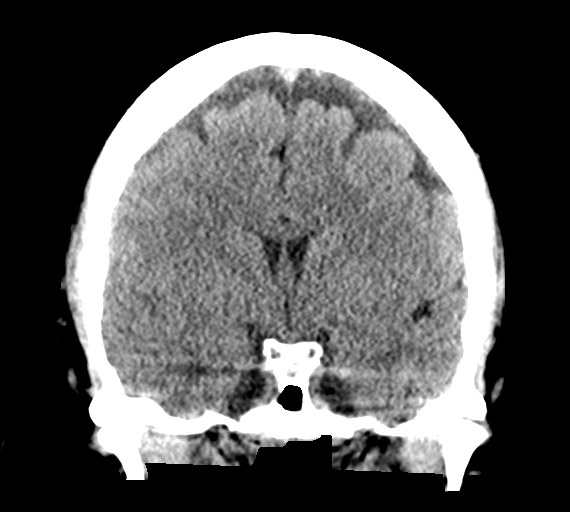
[im 39/70  brain]
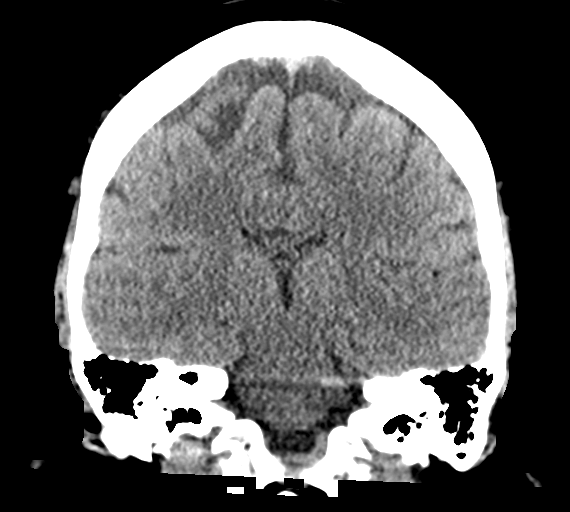

[Series 5: sagittal soft tissue · sagittal · 0.31mm/px · 3 of 52 slices shown]
[im 18/52  brain]
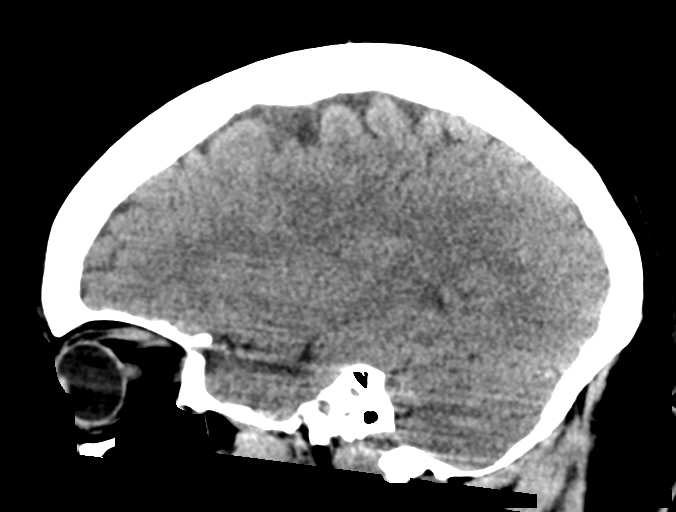
[im 26/52  brain]
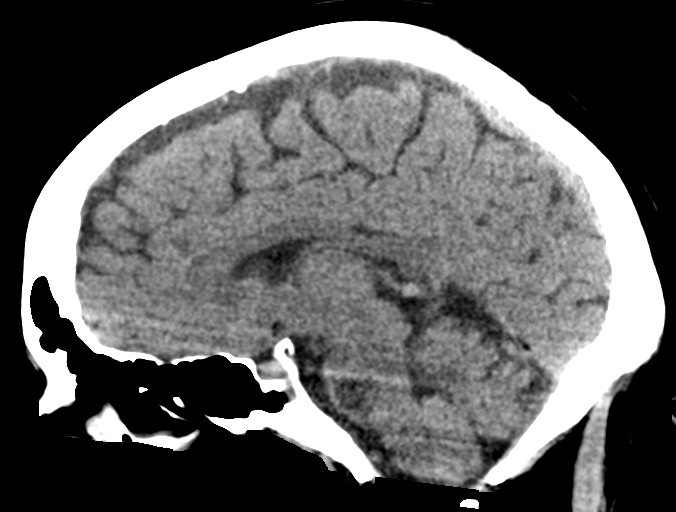
[im 35/52  brain]
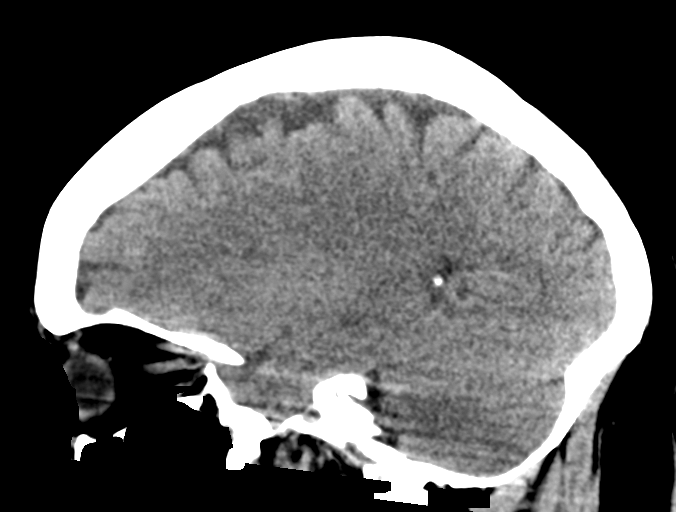

[16 of 47 positions shown; findings below may reference images not displayed]

FINDINGS: Brain: No evidence of acute infarction, hemorrhage, hydrocephalus,
extra-axial collection or mass lesion/mass effect.

The posterior fossa, including the cerebellum, brainstem and fourth
ventricle, is within normal limits. The third and lateral
ventricles, and basal ganglia are unremarkable in appearance. The
cerebral hemispheres are symmetric in appearance, with normal
gray-white differentiation. No mass effect or midline shift is seen.

Vascular: No hyperdense vessel or unexpected calcification.

Skull: There is no evidence of fracture; visualized osseous
structures are unremarkable in appearance.

Sinuses/Orbits: The orbits are within normal limits. The paranasal
sinuses and mastoid air cells are well-aerated.

Other: No significant soft tissue abnormalities are seen.
IMPRESSION: Unremarkable noncontrast CT of the head.

## 2019-06-09 ENCOUNTER — Observation Stay
Admission: EM | Admit: 2019-06-09 | Discharge: 2019-06-10 | Disposition: A | Discharge status: Home / Self Care | Payer: BC Managed Care – PPO | Attending: Internal Medicine | Admitting: Internal Medicine

## 2019-06-09 ENCOUNTER — Emergency Department: Payer: BC Managed Care – PPO

## 2019-06-09 ENCOUNTER — Other Ambulatory Visit: Payer: Self-pay

## 2019-06-09 DIAGNOSIS — J45909 Unspecified asthma, uncomplicated: Secondary | ICD-10-CM | POA: Insufficient documentation

## 2019-06-09 DIAGNOSIS — F419 Anxiety disorder, unspecified: Secondary | ICD-10-CM | POA: Diagnosis not present

## 2019-06-09 DIAGNOSIS — R9431 Abnormal electrocardiogram [ECG] [EKG]: Secondary | ICD-10-CM | POA: Insufficient documentation

## 2019-06-09 DIAGNOSIS — Z888 Allergy status to other drugs, medicaments and biological substances status: Secondary | ICD-10-CM | POA: Insufficient documentation

## 2019-06-09 DIAGNOSIS — R079 Chest pain, unspecified: Secondary | ICD-10-CM | POA: Insufficient documentation

## 2019-06-09 DIAGNOSIS — I517 Cardiomegaly: Secondary | ICD-10-CM | POA: Diagnosis not present

## 2019-06-09 DIAGNOSIS — Z91040 Latex allergy status: Secondary | ICD-10-CM | POA: Insufficient documentation

## 2019-06-09 DIAGNOSIS — E559 Vitamin D deficiency, unspecified: Secondary | ICD-10-CM | POA: Insufficient documentation

## 2019-06-09 DIAGNOSIS — Z79899 Other long term (current) drug therapy: Secondary | ICD-10-CM | POA: Insufficient documentation

## 2019-06-09 DIAGNOSIS — G2581 Restless legs syndrome: Secondary | ICD-10-CM | POA: Insufficient documentation

## 2019-06-09 DIAGNOSIS — G4733 Obstructive sleep apnea (adult) (pediatric): Secondary | ICD-10-CM | POA: Diagnosis not present

## 2019-06-09 DIAGNOSIS — Z9119 Patient's noncompliance with other medical treatment and regimen: Secondary | ICD-10-CM | POA: Diagnosis not present

## 2019-06-09 DIAGNOSIS — Z791 Long term (current) use of non-steroidal anti-inflammatories (NSAID): Secondary | ICD-10-CM | POA: Insufficient documentation

## 2019-06-09 DIAGNOSIS — Z6841 Body Mass Index (BMI) 40.0 and over, adult: Secondary | ICD-10-CM | POA: Insufficient documentation

## 2019-06-09 DIAGNOSIS — K219 Gastro-esophageal reflux disease without esophagitis: Secondary | ICD-10-CM | POA: Insufficient documentation

## 2019-06-09 DIAGNOSIS — Z20828 Contact with and (suspected) exposure to other viral communicable diseases: Secondary | ICD-10-CM | POA: Diagnosis not present

## 2019-06-09 DIAGNOSIS — E890 Postprocedural hypothyroidism: Secondary | ICD-10-CM | POA: Insufficient documentation

## 2019-06-09 DIAGNOSIS — R55 Syncope and collapse: Secondary | ICD-10-CM | POA: Diagnosis not present

## 2019-06-09 DIAGNOSIS — G43109 Migraine with aura, not intractable, without status migrainosus: Secondary | ICD-10-CM | POA: Insufficient documentation

## 2019-06-09 DIAGNOSIS — Z79890 Hormone replacement therapy (postmenopausal): Secondary | ICD-10-CM | POA: Insufficient documentation

## 2019-06-09 DIAGNOSIS — Z23 Encounter for immunization: Secondary | ICD-10-CM | POA: Insufficient documentation

## 2019-06-09 DIAGNOSIS — M170 Bilateral primary osteoarthritis of knee: Secondary | ICD-10-CM | POA: Insufficient documentation

## 2019-06-09 DIAGNOSIS — I440 Atrioventricular block, first degree: Secondary | ICD-10-CM | POA: Diagnosis not present

## 2019-06-09 DIAGNOSIS — F329 Major depressive disorder, single episode, unspecified: Secondary | ICD-10-CM | POA: Insufficient documentation

## 2019-06-09 DIAGNOSIS — Z8719 Personal history of other diseases of the digestive system: Secondary | ICD-10-CM | POA: Diagnosis not present

## 2019-06-09 DIAGNOSIS — Z881 Allergy status to other antibiotic agents status: Secondary | ICD-10-CM | POA: Insufficient documentation

## 2019-06-09 LAB — CBC
HCT: 41 % (ref 36.0–46.0)
Hemoglobin: 12.8 g/dL (ref 12.0–15.0)
MCH: 27.1 pg (ref 26.0–34.0)
MCHC: 31.2 g/dL (ref 30.0–36.0)
MCV: 86.9 fL (ref 80.0–100.0)
Platelets: 333 10*3/uL (ref 150–400)
RBC: 4.72 MIL/uL (ref 3.87–5.11)
RDW: 14.9 % (ref 11.5–15.5)
WBC: 7.9 10*3/uL (ref 4.0–10.5)
nRBC: 0 % (ref 0.0–0.2)

## 2019-06-09 LAB — BASIC METABOLIC PANEL
Anion gap: 10 (ref 5–15)
BUN: 13 mg/dL (ref 6–20)
CO2: 25 mmol/L (ref 22–32)
Calcium: 9.4 mg/dL (ref 8.9–10.3)
Chloride: 102 mmol/L (ref 98–111)
Creatinine, Ser: 0.66 mg/dL (ref 0.44–1.00)
GFR calc Af Amer: >60 mL/min (ref >60)
GFR calc non Af Amer: >60 mL/min (ref >60)
Glucose, Bld: 120 mg/dL — ABNORMAL HIGH (ref 70–99)
Potassium: 3.8 mmol/L (ref 3.5–5.1)
Sodium: 137 mmol/L (ref 135–145)

## 2019-06-09 LAB — URINALYSIS, COMPLETE (UACMP) WITH MICROSCOPIC
Bacteria, UA: NONE SEEN
Bilirubin Urine: NEGATIVE
Glucose, UA: NEGATIVE mg/dL
Hgb urine dipstick: NEGATIVE
Ketones, ur: NEGATIVE mg/dL
Leukocytes,Ua: NEGATIVE
Nitrite: NEGATIVE
Protein, ur: NEGATIVE mg/dL
Specific Gravity, Urine: 1.026 (ref 1.005–1.030)
pH: 5 (ref 5.0–8.0)

## 2019-06-09 LAB — POCT PREGNANCY, URINE: Preg Test, Ur: NEGATIVE

## 2019-06-09 MED ORDER — SODIUM CHLORIDE 0.9 % IV BOLUS
1000.0000 mL | Freq: Once | INTRAVENOUS | Status: AC
  Administered 2019-06-09: 1000 mL via INTRAVENOUS

## 2019-06-09 NOTE — ED Triage Notes (Signed)
Pt arrives to ED via [facility] from home with c/o chest pain and syncopal episode PTA. Per EMS, pt was brought to the the [place] station unconscious; given ammonia by EMS at which point the pt became A&O. EMS reports pt has h/x of same with last episode over 1 yr ago. Pt denies any recent illness; no N/V/D or fever. Pt was given 4mg  Zofran IM en route to ED. Pt states she was going to her family's house for dinner when she became very nauseous and weak, does not recall LOC. Pt reports left-sided chest pain without radiation. Pt is A&O, in NAD; RR even, regular, and unlabored.

## 2019-06-09 NOTE — ED Provider Notes (Signed)
[facility] Emergency Department Provider Note  ____________________________________________   First MD Initiated Contact with Patient 06/09/19 2251     (approximate)  I have reviewed the triage vital signs and the nursing notes.   HISTORY  Chief Complaint Chest Pain and Loss of Consciousness    HPI Claudia Campbell is a 48 y.o. female with Barrett's esophagus who presents for chest pain syncope episode patient reportedly was unconscious.  Patient was given ammonia which point patient became alert and oriented.  Per EMS there has been a history of this previously over 1 year ago.  Patient was given 4 mg Zofran on route.    Patient says she was feeling lightheaded like the room was spinning for a little while.  She then was in the car with her sister driving when she had LOC.  They were unable to wake her up.  They drove her to the local EMS and was awoken by the ammonia.  She says that she is passed out previously.  Last time was 1 year ago.  She never been admitted for.  She is unclear exactly what caused in the past.  She has had a history of thyroid issues and is currently getting her thyroid medication adjusted.  She has baseline left leg swelling after multiple surgeries on her left leg.  No new shortness of breath.  Her chest pain is currently a 2 out of 10.  She says originally it was a 6 out of 10.  Located on the left side of her chest.  She said that it radiated little bit into her shoulder and into her back.  She denied any abdominal pain.  The pain was constant, gradually getting better, nothing made it worse.  She is currently on doxycycline has had few episodes of diarrhea.  She did not hit her head.     Past Medical History:  Diagnosis Date   Abnormal liver function test    Arthritis    Asthma    Barrett esophagus    Depression    First degree AV block    GERD (gastroesophageal reflux disease)    Hypothyroidism    Post ablation    Migraines    PONV (postoperative nausea and vomiting)    Restless leg    Sleep apnea    NO cpap at this time, broken   Tenosynovitis    Thyroid disease    Thyroid nodule    Vitamin D deficiency     Patient Active Problem List   Diagnosis Date Noted   Anxiety, generalized 05/23/2017   Hyperplastic colonic polyp 05/23/2017   Kidney stones 05/23/2017   Multiple thyroid nodules 05/23/2017   OSA on CPAP 05/23/2017   PCOS (polycystic ovarian syndrome) 05/23/2017   Barrett's esophagus 05/22/2017   GERD (gastroesophageal reflux disease) 05/22/2017   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   Major depression, chronic 05/22/2017   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   Restless leg syndrome 05/22/2017   Vitamin D deficiency, unspecified 05/22/2017   Left wrist tendinitis 04/20/2017   Primary osteoarthritis of both knees 04/20/2017   Ulnar neuropathy at elbow of left upper extremity 04/20/2017    Past Surgical History:  Procedure Laterality Date   BLADDER SUSPENSION  2011   BREAST RECONSTRUCTION     BUNIONECTOMY Bilateral    CAPSULOTOMY Left 01/23/2018   Procedure: CAPSULOTOMY SECOND LEFT;  Surgeon: Edrick Kins, DPM;  Location: [facility];  Service: Podiatry;  Laterality: Left;   CHOLECYSTECTOMY     COLONOSCOPY  ESOPHAGOGASTRODUODENOSCOPY (EGD) WITH PROPOFOL N/A 01/18/2018   Procedure: ESOPHAGOGASTRODUODENOSCOPY (EGD) WITH PROPOFOL;  Surgeon: Lollie Sails, MD;  Location: [facility] END[facility]COPY;  Service: Endoscopy;  Laterality: N/A;   EXCISION MORTON'S NEUROMA     EXPLORATORY LAPAROTOMY     HAMMER TOE SURGERY Left 01/23/2018   Procedure: HAMMER TOE CORRECTION SECOND LEFT;  Surgeon: Edrick Kins, DPM;  Location: [facility];  Service: Podiatry;  Laterality: Left;   HYSTEROSCOPY W/ ENDOMETRIAL ABLATION     KNEE ARTHROSCOPY Left    MENISECTOMY     x2   METATARSAL OSTEOTOMY Left 01/23/2018   Procedure: METATARSAL OSTEOTOMY SECOND LEFT;  Surgeon: Edrick Kins, DPM;  Location: [facility];  Service: Podiatry;  Laterality: Left;   monitoring cranial nerves Bilateral    OSTECTOMY CALCANEUS Left    RAI ablation  2007   TENOTOMY Left    toe   TONSILLECTOMY AND ADENOIDECTOMY  10/1981   topaz Left    heel   TOTAL THYROIDECTOMY  10/12/2017   TOTAL THYROIDECTOMY     TUBAL LIGATION     UPPER GI ENDOSCOPY      Prior to Admission medications   Medication Sig Start Date End Date Taking? Authorizing Provider  esomeprazole (NEXIUM) 40 MG capsule Take by mouth. 01/04/18   [provider]  gentamicin cream (GARAMYCIN) 0.1 % Apply 1 application topically 2 (two) times daily. 04/11/19   Edrick Kins, DPM  meloxicam (MOBIC) 15 MG tablet Take 15 mg by mouth daily. 01/23/18   [provider]  montelukast (SINGULAIR) 10 MG tablet Take by mouth. 01/04/18   [provider]  pramipexole (MIRAPEX) 1 MG tablet Take by mouth. 01/04/18   [provider]  sertraline (ZOLOFT) 100 MG tablet  04/27/19   [provider]  SYNTHROID 200 MCG tablet  04/27/19   [provider]  topiramate (TOPAMAX) 25 MG tablet Take 25 mg at night x 2 weeks then increase to 25 mg BID x2 weeks then increase to 25 mg AM and 50 mg x 2 weeks PM then 50 mg BID 04/05/18   [provider]    Allergies Aspartame, Ciprofloxacin, Latex, and Dicyclomine  No family history on file.  Social History Social History   Tobacco Use   Smoking status: Never Smoker   Smokeless tobacco: Never Used  Substance Use Topics   Alcohol use: No   Drug use: No      Review of Systems Constitutional: No fever/chills, positive syncope Eyes: No visual changes. ENT: No sore throat. Cardiovascular: Positive chest pain Respiratory: Denies shortness of breath. Gastrointestinal: No abdominal pain.  No nausea, no vomiting.  Positive diarrhea.  No constipation. Genitourinary: Negative for dysuria. Musculoskeletal: Negative for back pain. Skin:  Negative for rash. Neurological: Negative for headaches, focal weakness or numbness. All other ROS negative ____________________________________________   PHYSICAL EXAM:  VITAL SIGNS: ED Triage Vitals  Enc Vitals Group     BP 06/09/19 2028 136/84     Pulse Rate 06/09/19 2028 75     Resp 06/09/19 2028 17     Temp 06/09/19 2028 98 F (36.7 C)     Temp Source 06/09/19 2028 Oral     SpO2 06/09/19 2028 96 %     Weight 06/09/19 2029 (!) 445 lb (201.9 kg)     Height 06/09/19 2029 5\' 9"  (1.753 m)     Head Circumference --      Peak Flow --      Pain Score 06/09/19  2029 6     Pain Loc --      Pain Edu? --      Excl. in [place]? --     Constitutional: Alert and oriented. Well appearing and in no acute distress. Eyes: Conjunctivae are normal. EOMI. Head: Atraumatic. Nose: No congestion/rhinnorhea. Mouth/Throat: Mucous membranes are moist.   Neck: No stridor. Trachea Midline. FROM Cardiovascular: Normal rate, regular rhythm. Grossly normal heart sounds.  Good peripheral circulation.  Distal pulses palpated and are equal bilaterally. Respiratory: Normal respiratory effort.  No retractions. Lungs CTAB. Gastrointestinal: Soft and nontender. No distention. No abdominal bruits.  Musculoskeletal: Left leg slightly larger than her right leg at baseline per patient for years now secondary to prior surgeries on the left leg.  No joint effusions.  May be slightly weaker pulse on the left but still palpable. Neurologic:  Normal speech and language. No gross focal neurologic deficits are appreciated.  Skin:  Skin is warm, dry and intact. No rash noted. Psychiatric: Mood and affect are normal. Speech and behavior are normal. GU: Deferred   ____________________________________________   LABS (all labs ordered are listed, but only abnormal results are displayed)  Labs Reviewed  BASIC METABOLIC PANEL - Abnormal; Notable for the following components:      Result Value   Glucose, Bld 120 (*)    All  other components within normal limits  URINALYSIS, COMPLETE (UACMP) WITH MICROSCOPIC - Abnormal; Notable for the following components:   Color, Urine YELLOW (*)    APPearance HAZY (*)    All other components within normal limits  SARS CORONAVIRUS 2 (TAT 6-24 HRS)  CBC  TSH  HIV ANTIBODY (ROUTINE TESTING W REFLEX)  HIV4GL SAVE TUBE  POC URINE PREG, ED  POCT PREGNANCY, URINE  TROPONIN I (HIGH SENSITIVITY)  TROPONIN I (HIGH SENSITIVITY)  TROPONIN I (HIGH SENSITIVITY)   ____________________________________________   ED ECG REPORT I, Vanessa Mohnton, the attending physician, personally viewed and interpreted this ECG.  EKG is normal sinus rate of 73, no ST elevation, no T wave inversion, normal intervals except type I AV block ____________________________________________  RADIOLOGY Robert Bellow, personally viewed and evaluated these images (plain radiographs) as part of my medical decision making, as well as reviewing the written report by the radiologist.  ED MD interpretation: No pneumonia  Official radiology report(s): Dg Chest 2 View  Result Date: 06/10/2019 CLINICAL DATA:  Chest pain EXAM: CHEST - 2 VIEW COMPARISON:  01/18/2018 FINDINGS: Cardiomegaly with mild central vascular congestion. No consolidation or pleural effusion. No pneumothorax. IMPRESSION: Cardiomegaly with vascular congestion. Electronically Signed   By: Donavan Foil M.D.   On: 06/10/2019 00:26   Ct Head Wo Contrast  Result Date: 06/10/2019 CLINICAL DATA:  48 year old female with syncope. EXAM: CT HEAD WITHOUT CONTRAST TECHNIQUE: Contiguous axial images were obtained from the base of the skull through the vertex without intravenous contrast. COMPARISON:  Head CT 08/24/2017. FINDINGS: Brain: Normal cerebral volume. No midline shift, ventriculomegaly, mass effect, evidence of mass lesion, intracranial hemorrhage or evidence of cortically based acute infarction. Gray-white matter differentiation is within normal  limits throughout the brain. Vascular: No suspicious intracranial vascular hyperdensity. Skull: Negative. Sinuses/Orbits: Visualized paranasal sinuses and mastoids are stable and well pneumatized. Other: Visualized orbits and scalp soft tissues are within normal limits. IMPRESSION: Stable and normal noncontrast CT appearance of the brain. Electronically Signed   By: Genevie Ann M.D.   On: 06/10/2019 03:19    ____________________________________________   PROCEDURES  Procedure(s) performed (including  Critical Care):  Procedures   ____________________________________________   INITIAL IMPRESSION / ASSESSMENT AND PLAN / ED COURSE   Claudia Campbell was evaluated in Emergency Department on 06/09/2019 for the symptoms described in the history of present illness. She was evaluated in the context of the global COVID-19 pandemic, which necessitated consideration that the patient might be at risk for infection with the SARS-CoV-2 virus that causes COVID-19. Institutional protocols and algorithms that pertain to the evaluation of patients at risk for COVID-19 are in a state of rapid change based on information released by regulatory bodies including the CDC and federal and state organizations. These policies and algorithms were followed during the patient's care in the ED.    Most Likely DDx:  -MSK (atypical chest pain) but will get cardiac markers to evaluate for ACS given risk factors/age   DDx that was also considered d/t potential to cause harm, but was found less likely based on history and physical (as detailed above): -PNA (no fevers, cough but CXR to evaluate) -PNX (reassured with equal b/l breath sounds, CXR to evaluate) -Symptomatic anemia (will get H&H) -Pulmonary embolism as no sob at rest, not pleuritic in nature, no hypoxia -Aortic Dissection as no tearing pain and no radiation to the mid back, pulses equal although somewhat decreased on the left foot although this could be secondary to  her prior surgical history.  We discussed CT imaging however will hold off at this time given low suspicions.  Patient does not have a history of high blood pressures or other risk factors for dissection. -Pericarditis no rub on exam, EKG changes or hx to suggest dx -Tamponade (no notable SOB, tachycardic, hypotensive) -Esophageal rupture (no h/o diffuse vomitting/no crepitus)  Troponins were negative.  X-ray was concerning for some cardiomegaly.  Patient denies previously having echocardiogram.  Given the syncope with chest pain will admit patient for echo, cardiac monitoring and further evaluation.      ____________________________________________   FINAL CLINICAL IMPRESSION(S) / ED DIAGNOSES   Final diagnoses:  Chest pain, unspecified type  Syncope and collapse     MEDICATIONS GIVEN DURING THIS VISIT:  Medications  meloxicam (MOBIC) tablet 15 mg (has no administration in time range)  sertraline (ZOLOFT) tablet 150 mg (has no administration in time range)  levothyroxine (SYNTHROID) tablet 200 mcg (has no administration in time range)  pramipexole (MIRAPEX) tablet 2 mg (has no administration in time range)  pantoprazole (PROTONIX) EC tablet 40 mg (has no administration in time range)  montelukast (SINGULAIR) tablet 10 mg (has no administration in time range)  sodium chloride flush (NS) 0.9 % injection 3 mL (has no administration in time range)  enoxaparin (LOVENOX) injection 40 mg (has no administration in time range)  sodium chloride 0.9 % bolus 1,000 mL (1,000 mLs Intravenous New Bag/Given 06/09/19 2353)     ED Discharge Orders    None       Note:  This document was prepared using Dragon voice recognition software and may include unintentional dictation errors.   Vanessa Laurel, MD 06/10/19 [id number]

## 2019-06-09 NOTE — ED Notes (Signed)
Pt st she "passed out" witnessed by sister. Pt was on the car waiting, per sister pt LOC for "8-10 minutes", pt was not able to respond, sister called EMS. Pt st she had a similar episode for "over a year ago". Pt st feeling dizzy, nauseous before LOC. Pt st left sided CP, pt describes it as "palpatations".

## 2019-06-10 ENCOUNTER — Telehealth: Payer: Self-pay

## 2019-06-10 ENCOUNTER — Observation Stay: Payer: BC Managed Care – PPO

## 2019-06-10 ENCOUNTER — Observation Stay (HOSPITAL_BASED_OUTPATIENT_CLINIC_OR_DEPARTMENT_OTHER)
Admit: 2019-06-10 | Discharge: 2019-06-10 | Disposition: A | Discharge status: Home / Self Care | Payer: BC Managed Care – PPO | Attending: Nurse Practitioner | Admitting: Nurse Practitioner

## 2019-06-10 ENCOUNTER — Ambulatory Visit (INDEPENDENT_AMBULATORY_CARE_PROVIDER_SITE_OTHER): Payer: BC Managed Care – PPO

## 2019-06-10 ENCOUNTER — Other Ambulatory Visit: Payer: Self-pay

## 2019-06-10 ENCOUNTER — Encounter: Payer: Self-pay | Admitting: Physician Assistant

## 2019-06-10 DIAGNOSIS — R55 Syncope and collapse: Secondary | ICD-10-CM | POA: Diagnosis present

## 2019-06-10 DIAGNOSIS — R079 Chest pain, unspecified: Secondary | ICD-10-CM | POA: Diagnosis not present

## 2019-06-10 LAB — TSH: TSH: 15.599 u[IU]/mL — ABNORMAL HIGH (ref 0.350–4.500)

## 2019-06-10 LAB — ECHOCARDIOGRAM COMPLETE
Height: 69 in
Weight: 7120 oz

## 2019-06-10 LAB — T4, FREE: Free T4: 0.87 ng/dL (ref 0.61–1.12)

## 2019-06-10 LAB — GLUCOSE, CAPILLARY: Glucose-Capillary: 100 mg/dL — ABNORMAL HIGH (ref 70–99)

## 2019-06-10 LAB — TROPONIN I (HIGH SENSITIVITY)
Troponin I (High Sensitivity): 3 ng/L (ref <18)
Troponin I (High Sensitivity): 3 ng/L (ref <18)
Troponin I (High Sensitivity): 3 ng/L (ref <18)

## 2019-06-10 LAB — SARS CORONAVIRUS 2 (TAT 6-24 HRS): SARS Coronavirus 2: NEGATIVE

## 2019-06-10 LAB — HIV ANTIBODY (ROUTINE TESTING W REFLEX): HIV Screen 4th Generation wRfx: NONREACTIVE

## 2019-06-10 MED ORDER — PRAMIPEXOLE DIHYDROCHLORIDE 1 MG PO TABS
2.0000 mg | ORAL_TABLET | Freq: Every day | ORAL | Status: DC
  Administered 2019-06-10: 2 mg via ORAL
  Filled 2019-06-10 (×2): qty 2

## 2019-06-10 MED ORDER — ENOXAPARIN SODIUM 40 MG/0.4ML ~~LOC~~ SOLN: 40.0000 mg | SUBCUTANEOUS | Status: DC

## 2019-06-10 MED ORDER — PNEUMOCOCCAL VAC POLYVALENT 25 MCG/0.5ML IJ INJ
0.5000 mL | INJECTION | Freq: Once | INTRAMUSCULAR | Status: AC
  Administered 2019-06-10: 0.5 mL via INTRAMUSCULAR
  Filled 2019-06-10: qty 0.5

## 2019-06-10 MED ORDER — MELOXICAM 7.5 MG PO TABS
15.0000 mg | ORAL_TABLET | Freq: Every day | ORAL | Status: DC
  Filled 2019-06-10: qty 2

## 2019-06-10 MED ORDER — SODIUM CHLORIDE 0.9% FLUSH
3.0000 mL | Freq: Two times a day (BID) | INTRAVENOUS | Status: DC
  Administered 2019-06-10: 3 mL via INTRAVENOUS

## 2019-06-10 MED ORDER — MONTELUKAST SODIUM 10 MG PO TABS
10.0000 mg | ORAL_TABLET | Freq: Every day | ORAL | Status: DC
  Administered 2019-06-10: 10 mg via ORAL
  Filled 2019-06-10 (×2): qty 1

## 2019-06-10 MED ORDER — DOXYCYCLINE HYCLATE 100 MG PO TABS: 100.0000 mg | ORAL_TABLET | Freq: Every day | ORAL | Status: DC

## 2019-06-10 MED ORDER — ZONISAMIDE 100 MG PO CAPS
100.0000 mg | ORAL_CAPSULE | Freq: Every day | ORAL | Status: DC
  Administered 2019-06-10: 100 mg via ORAL
  Filled 2019-06-10: qty 1

## 2019-06-10 MED ORDER — LEVOTHYROXINE SODIUM 100 MCG PO TABS
200.0000 ug | ORAL_TABLET | Freq: Every day | ORAL | Status: DC
  Administered 2019-06-10: 200 ug via ORAL
  Filled 2019-06-10: qty 4

## 2019-06-10 MED ORDER — PANTOPRAZOLE SODIUM 40 MG PO TBEC
40.0000 mg | DELAYED_RELEASE_TABLET | Freq: Every day | ORAL | Status: DC
  Administered 2019-06-10: 40 mg via ORAL
  Filled 2019-06-10: qty 1

## 2019-06-10 MED ORDER — SERTRALINE HCL 50 MG PO TABS
150.0000 mg | ORAL_TABLET | Freq: Every day | ORAL | Status: DC
  Administered 2019-06-10: 10:00:00 150 mg via ORAL
  Filled 2019-06-10: qty 3

## 2019-06-10 NOTE — ED Notes (Signed)
Pt placed on 2L at this time, pt o2 levels dropping while asleep.

## 2019-06-10 NOTE — Progress Notes (Signed)
Patient given discharge instructions. Patient verbalized understanding without any questions or concerns. zio AT monitor placed by [facility] RN at bedside. Patient discharged in stable condition.

## 2019-06-10 NOTE — ED Notes (Signed)
Report to daniel rn

## 2019-06-10 NOTE — ED Notes (Signed)
Floor unable to take report at this time.

## 2019-06-10 NOTE — Plan of Care (Signed)

## 2019-06-10 NOTE — Discharge Summary (Signed)
[facility] NAME: Claudia Campbell    MR#:  [id number]  DATE OF BIRTH:  [birth year]  DATE OF ADMISSION:  06/09/2019   ADMITTING PHYSICIAN: Lang Snow, NP  DATE OF DISCHARGE: 06/10/19  PRIMARY CARE PHYSICIAN: Zeb Comfort, MD   ADMISSION DIAGNOSIS:  Syncope and collapse [R55] Chest pain, unspecified type [R07.9] DISCHARGE DIAGNOSIS:  Active Problems:   Syncope  SECONDARY DIAGNOSIS:   Past Medical History:  Diagnosis Date  . Abnormal liver function test   . Arthritis   . Asthma   . Barrett esophagus   . Depression   . First degree AV block   . GERD (gastroesophageal reflux disease)   . Hypothyroidism    Post ablation  . Migraines   . PONV (postoperative nausea and vomiting)   . Restless leg   . Sleep apnea    NO cpap at this time, broken  . Tenosynovitis   . Thyroid nodule   . Vitamin D deficiency    HOSPITAL COURSE:   Claudia Campbell is a 48 year old female who presented to the ED with syncope.  In the ED, vitals and labs were unremarkable.  Hospitalists were called for admission.  Syncope-seems to be a reoccurring issue for her, usually related to "overheating". -CT head was negative -Echo with normal EF -Carotid Dopplers were normal -TSH was elevated-see plan below -No rhythm abnormalities were noted on telemetry -Cardiology was consulted and recommended a 14-day ZIO monitor -Patient will follow up with cardiology in clinic in 2 weeks  Uncontrolled hypothyroidism-patient states she stopped taking her Synthroid for a long time.  She restarted her Synthroid about 4 weeks ago. -TSH was 15, T4 was normal -Continue Synthroid at current dose of 200 mcg daily -Needs thyroid function testing repeated with PCP  OSA -Encouraged CPAP use  History of complicated migraines -Zonisamide was continued  Restless leg syndrome-stable -Continue home pramipexole  Depression and anxiety -Home Zoloft was  continued  GERD-stable -Continued home Protonix  DISCHARGE CONDITIONS:  Uncontrolled hypothyroidism OSA History of complicated migraines Restless leg syndrome Anxiety depression GERD CONSULTS OBTAINED:  Treatment Team:  Nelva Bush, MD DRUG ALLERGIES:   Allergies  Allergen Reactions  . Aspartame Other (See Comments)    Passes out  . Ciprofloxacin Hives  . Latex Swelling    Around mouth  . Dicyclomine Palpitations   DISCHARGE MEDICATIONS:   Allergies as of 06/10/2019      Reactions   Aspartame Other (See Comments)   Passes out   Ciprofloxacin Hives   Latex Swelling   Around mouth   Dicyclomine Palpitations      Medication List    TAKE these medications   doxycycline 100 MG capsule Commonly known as: VIBRAMYCIN Take 1 capsule by mouth daily with supper.   esomeprazole 40 MG capsule Commonly known as: NEXIUM Take 40 mg by mouth 2 (two) times daily before a meal.   Mirapex 1 MG tablet Generic drug: pramipexole Take 2 mg by mouth at bedtime.   montelukast 10 MG tablet Commonly known as: SINGULAIR Take 10 mg by mouth at bedtime.   sertraline 100 MG tablet Commonly known as: ZOLOFT Take 150 mg by mouth daily.   Synthroid 200 MCG tablet Generic drug: levothyroxine Take 200 mcg by mouth daily before breakfast.   topiramate 25 MG tablet Commonly known as: TOPAMAX Take 25 mg at night x 2 weeks then increase to 25 mg BID x2 weeks then increase to 25 mg  AM and 50 mg x 2 weeks PM then 50 mg BID   zonisamide 100 MG capsule Commonly known as: ZONEGRAN Take 100 mg by mouth daily.        DISCHARGE INSTRUCTIONS:  1.  Follow-up with PCP in 5 days 2.  Continue Synthroid at current dose.  Needs thyroid function testing repeated as an outpatient 3.  Follow-up with cardiology in 2 weeks 4.  ZIO monitor placed this admission DIET:  Cardiac diet DISCHARGE CONDITION:  Stable ACTIVITY:  Activity as tolerated OXYGEN:  Home Oxygen: No.  Oxygen  Delivery: room air DISCHARGE LOCATION:  home   If you experience worsening of your admission symptoms, develop shortness of breath, life threatening emergency, suicidal or homicidal thoughts you must seek medical attention immediately by calling 911 or calling your MD immediately  if symptoms less severe.  You Must read complete instructions/literature along with all the possible adverse reactions/side effects for all the Medicines you take and that have been prescribed to you. Take any new Medicines after you have completely understood and accpet all the possible adverse reactions/side effects.   Please note  You were cared for by a hospitalist during your hospital stay. If you have any questions about your discharge medications or the care you received while you were in the hospital after you are discharged, you can call the unit and asked to speak with the hospitalist on call if the hospitalist that took care of you is not available. Once you are discharged, your primary care physician will handle any further medical issues. Please note that NO REFILLS for any discharge medications will be authorized once you are discharged, as it is imperative that you return to your primary care physician (or establish a relationship with a primary care physician if you do not have one) for your aftercare needs so that they can reassess your need for medications and monitor your lab values.    On the day of Discharge:  VITAL SIGNS:  Blood pressure 138/79, pulse 91, temperature 98.5 F (36.9 C), temperature source Oral, resp. rate 18, height 5\' 9"  (1.753 m), weight (!) 201.8 kg, SpO2 97 %. PHYSICAL EXAMINATION:  GENERAL:  48 y.o.-year-old patient lying in the bed with no acute distress.  Well-appearing. EYES: Pupils equal, round, reactive to light and accommodation. No scleral icterus. Extraocular muscles intact.  HEENT: Head atraumatic, normocephalic. Oropharynx and nasopharynx clear.  NECK:  Supple, no  jugular venous distention. No thyroid enlargement, no tenderness.  LUNGS: Normal breath sounds bilaterally, no wheezing, rales,rhonchi or crepitation. No use of accessory muscles of respiration.  CARDIOVASCULAR: S1, S2 normal. No murmurs, rubs, or gallops.  ABDOMEN: Soft, non-tender, non-distended. Bowel sounds present. No organomegaly or mass.  EXTREMITIES: No pedal edema, cyanosis, or clubbing.  NEUROLOGIC: Cranial nerves II through XII are intact. Muscle strength 5/5 in all extremities. Sensation intact. Gait not checked.  PSYCHIATRIC: The patient is alert and oriented x 3.  SKIN: No obvious rash, lesion, or ulcer.  DATA REVIEW:   CBC Recent Labs  Lab 06/09/19 2032  WBC 7.9  HGB 12.8  HCT 41.0  PLT 333    Chemistries  Recent Labs  Lab 06/09/19 2032  NA 137  K 3.8  CL 102  CO2 25  GLUCOSE 120*  BUN 13  CREATININE 0.66  CALCIUM 9.4     Microbiology Results  Results for orders placed or performed during the hospital encounter of 06/09/19  SARS CORONAVIRUS 2 (TAT 6-24 HRS) Nasopharyngeal Nasopharyngeal Swab  Status: None   Collection Time: 06/10/19  1:17 AM   Specimen: Nasopharyngeal Swab  Result Value Ref Range Status   SARS Coronavirus 2 NEGATIVE NEGATIVE Final    Comment: (NOTE) SARS-CoV-2 target nucleic acids are NOT DETECTED. The SARS-CoV-2 RNA is generally detectable in upper and lower respiratory specimens during the acute phase of infection. Negative results do not preclude SARS-CoV-2 infection, do not rule out co-infections with other pathogens, and should not be used as the sole basis for treatment or other patient management decisions. Negative results must be combined with clinical observations, patient history, and epidemiological information. The expected result is Negative. Fact Sheet for Patients: SugarRoll.be Fact Sheet for Healthcare Providers: https://www.woods-mathews.com/ This test is not yet  approved or cleared by the [place] FDA and  has been authorized for detection and/or diagnosis of SARS-CoV-2 by FDA under an Emergency Use Authorization (EUA). This EUA will remain  in effect (meaning this test can be used) for the duration of the COVID-19 declaration under Section 56 4(b)(1) of the Act, 21 U.S.C. section 360bbb-3(b)(1), unless the authorization is terminated or revoked sooner. Performed at [facility], [place] [address], [place], [place] [address]     RADIOLOGY:  Dg Chest 2 View  Result Date: 06/10/2019 CLINICAL DATA:  Chest pain EXAM: CHEST - 2 VIEW COMPARISON:  01/18/2018 FINDINGS: Cardiomegaly with mild central vascular congestion. No consolidation or pleural effusion. No pneumothorax. IMPRESSION: Cardiomegaly with vascular congestion. Electronically Signed   By: Donavan Foil M.D.   On: 06/10/2019 00:26   Ct Head Wo Contrast  Result Date: 06/10/2019 CLINICAL DATA:  48 year old female with syncope. EXAM: CT HEAD WITHOUT CONTRAST TECHNIQUE: Contiguous axial images were obtained from the base of the skull through the vertex without intravenous contrast. COMPARISON:  Head CT 08/24/2017. FINDINGS: Brain: Normal cerebral volume. No midline shift, ventriculomegaly, mass effect, evidence of mass lesion, intracranial hemorrhage or evidence of cortically based acute infarction. Gray-white matter differentiation is within normal limits throughout the brain. Vascular: No suspicious intracranial vascular hyperdensity. Skull: Negative. Sinuses/Orbits: Visualized paranasal sinuses and mastoids are stable and well pneumatized. Other: Visualized orbits and scalp soft tissues are within normal limits. IMPRESSION: Stable and normal noncontrast CT appearance of the brain. Electronically Signed   By: Genevie Ann M.D.   On: 06/10/2019 03:19   US Carotid Bilateral  Result Date: 06/10/2019 CLINICAL DATA:  Syncope EXAM: BILATERAL CAROTID DUPLEX ULTRASOUND TECHNIQUE: Pearline Cables scale imaging,  color Doppler and duplex ultrasound were performed of bilateral carotid and vertebral arteries in the neck. COMPARISON:  None. FINDINGS: Criteria: Quantification of carotid stenosis is based on velocity parameters that correlate the residual internal carotid diameter with NASCET-based stenosis levels, using the diameter of the distal internal carotid lumen as the denominator for stenosis measurement. The following velocity measurements were obtained: RIGHT ICA: 79/35 cm/sec CCA: [id number] cm/sec SYSTOLIC ICA/CCA RATIO:  1.0 ECA: 100 cm/sec LEFT ICA: 77/27 cm/sec CCA: [id number] cm/sec SYSTOLIC ICA/CCA RATIO:  0.9 ECA: 109 cm/sec RIGHT CAROTID ARTERY: No significant plaque accumulation or stenosis. Normal waveforms and color Doppler signal. RIGHT VERTEBRAL ARTERY:  Normal flow direction and waveform. LEFT CAROTID ARTERY: No plaque or stenosis. Normal waveforms and color Doppler signal. LEFT VERTEBRAL ARTERY:  Normal flow direction and waveform. Subcentimeter left supraclavicular lymph node incidentally noted. IMPRESSION: Negative.  No significant carotid plaque or stenosis. Electronically Signed   By: Lucrezia Europe M.D.   On: 06/10/2019 13:24     Management plans discussed with the patient, family and  they are in agreement.  CODE STATUS: Full Code   TOTAL TIME TAKING CARE OF THIS PATIENT: 45 minutes.    Berna Spare Elbert Polyakov M.D on 06/10/2019 at 2:27 PM  Between 7am to 6pm - Pager [phone]  After 6pm go to [email or web address] - Proofreader  [facility]  Office  [phone]  CC: Primary care physician; Zeb Comfort, MD   Note: This dictation was prepared with Dragon dictation along with smaller phrase technology. Any transcriptional errors that result from this process are unintentional.

## 2019-06-10 NOTE — Telephone Encounter (Signed)
Request from Christell Faith, PA to place zio AT monitor for syncope. Pt being d/c today.   1/2 placed in hospital, 2/2 to be mailed to pt. 1 month total.  Advised pt to call for any further questions or concerns.

## 2019-06-10 NOTE — ED Notes (Signed)
Pt assisted to bathroom. Pt denies CP, dizziness and weakness. Upon ambulation pt had a steady gait.

## 2019-06-10 NOTE — H&P (Addendum)
[facility] [place] NAME: Claudia Campbell    MR#:  [id number]  DATE OF BIRTH:  [birth year]  DATE OF ADMISSION:  06/09/2019  PRIMARY CARE PHYSICIAN: Zeb Comfort, MD   REQUESTING/REFERRING PHYSICIAN:   CHIEF COMPLAINT:   Chief Complaint  Patient presents with  . Chest Pain  . Loss of Consciousness    HISTORY OF PRESENT ILLNESS:  48 y.o. female with pertinent past medical history of asthma, Barrett's esophagus, anxiety and depression, GERD, first-degree AV block, hypothyroidism, complicated migraine, restless leg syndrome, sleep apnea not on CPAP vitamin D deficiency presenting to the ED with syncope.  Patient states she was feeling lightheaded describing as room spinning sensation associated with nausea without vomiting.  She decided to sit in the car with her sister driving when she suddenly lost consciousness. Per patient's sister patient was unresponsive to verbal and tactile stimuli for approximately 10 minutes.  Patient denies associated symptoms preceding the event of vomiting, feeling cold or clammy, visual auras or blurry vision, palpitations, shortness of breath chest pain.  Patient states when she regained consciousness she could not recall the event for a few minutes.  No loss of bowel or bladder function, denies tongue biting.  On arrival to the ED, she was afebrile with blood pressure 136/75 mm Hg and pulse rate 75 beats/min. There were no focal neurological deficits; he was alert and oriented x4, and she did not demonstrate any memory deficits.  Labs revealed unremarkable CBC and CMP, troponin.  Chest x-ray shows cardiomegaly with vascular congestion. Hospitalist asked to admit for further evaluation.  PAST MEDICAL HISTORY:   Past Medical History:  Diagnosis Date  . Abnormal liver function test   . Arthritis   . Asthma   . Barrett esophagus   . Depression   . First degree AV block   . GERD (gastroesophageal reflux  disease)   . Hypothyroidism    Post ablation  . Migraines   . PONV (postoperative nausea and vomiting)   . Restless leg   . Sleep apnea    NO cpap at this time, broken  . Tenosynovitis   . Thyroid disease   . Thyroid nodule   . Vitamin D deficiency     PAST SURGICAL HISTORY:   Past Surgical History:  Procedure Laterality Date  . BLADDER SUSPENSION  2011  . BREAST RECONSTRUCTION    . BUNIONECTOMY Bilateral   . CAPSULOTOMY Left 01/23/2018   Procedure: CAPSULOTOMY SECOND LEFT;  Surgeon: Edrick Kins, DPM;  Location: [facility];  Service: Podiatry;  Laterality: Left;  . CHOLECYSTECTOMY    . COLONOSCOPY    . ESOPHAGOGASTRODUODENOSCOPY (EGD) WITH PROPOFOL N/A 01/18/2018   Procedure: ESOPHAGOGASTRODUODENOSCOPY (EGD) WITH PROPOFOL;  Surgeon: Lollie Sails, MD;  Location: [facility];  Service: Endoscopy;  Laterality: N/A;  . EXCISION MORTON'S NEUROMA    . EXPLORATORY LAPAROTOMY    . HAMMER TOE SURGERY Left 01/23/2018   Procedure: HAMMER TOE CORRECTION SECOND LEFT;  Surgeon: Edrick Kins, DPM;  Location: [facility];  Service: Podiatry;  Laterality: Left;  . HYSTEROSCOPY W/ ENDOMETRIAL ABLATION    . KNEE ARTHROSCOPY Left   . MENISECTOMY     x2  . METATARSAL OSTEOTOMY Left 01/23/2018   Procedure: METATARSAL OSTEOTOMY SECOND LEFT;  Surgeon: Edrick Kins, DPM;  Location: [facility];  Service: Podiatry;  Laterality: Left;  . monitoring cranial nerves Bilateral   . OSTECTOMY CALCANEUS Left   . RAI ablation  2007  . TENOTOMY Left    toe  . TONSILLECTOMY AND ADENOIDECTOMY  10/1981  . topaz Left    heel  . TOTAL THYROIDECTOMY  10/12/2017  . TOTAL THYROIDECTOMY    . TUBAL LIGATION    . UPPER GI ENDOSCOPY      SOCIAL HISTORY:   Social History   Tobacco Use  . Smoking status: Never Smoker  . Smokeless tobacco: Never Used  Substance Use Topics  . Alcohol use: No    FAMILY HISTORY:  No family history on file.  DRUG ALLERGIES:   Allergies  Allergen Reactions  . Aspartame  Other (See Comments)    Passes out  . Ciprofloxacin Hives  . Latex Swelling    Around mouth  . Dicyclomine Palpitations    REVIEW OF SYSTEMS:   Review of Systems  Constitutional: Negative for chills, fever, malaise/fatigue and weight loss.  HENT: Negative for congestion, hearing loss and sore throat.   Eyes: Negative for blurred vision and double vision.  Respiratory: Negative for cough, shortness of breath and wheezing.   Cardiovascular: Negative for chest pain, palpitations, orthopnea and leg swelling.  Gastrointestinal: Negative for abdominal pain, diarrhea, nausea and vomiting.  Genitourinary: Negative for dysuria and urgency.  Musculoskeletal: Negative for myalgias.  Skin: Negative for rash.  Neurological: Positive for dizziness, loss of consciousness and headaches. Negative for sensory change, speech change and focal weakness.  Psychiatric/Behavioral: Positive for depression. The patient is nervous/anxious.    MEDICATIONS AT HOME:   Prior to Admission medications   Medication Sig Start Date End Date Taking? Authorizing Provider  doxycycline (VIBRAMYCIN) 100 MG capsule Take 1 capsule by mouth daily with supper. 05/09/19  Yes [provider]  esomeprazole (NEXIUM) 40 MG capsule Take 40 mg by mouth 2 (two) times daily before a meal.  01/04/18  Yes [provider]  montelukast (SINGULAIR) 10 MG tablet Take 10 mg by mouth at bedtime.  01/04/18  Yes [provider]  pramipexole (MIRAPEX) 1 MG tablet Take 2 mg by mouth at bedtime.  01/04/18  Yes [provider]  sertraline (ZOLOFT) 100 MG tablet Take 150 mg by mouth daily.  04/27/19  Yes [provider]  SYNTHROID 200 MCG tablet Take 200 mcg by mouth daily before breakfast.  04/27/19  Yes [provider]  zonisamide (ZONEGRAN) 100 MG capsule Take 100 mg by mouth daily.   Yes [provider]  topiramate (TOPAMAX) 25 MG tablet Take 25 mg at night x 2 weeks then increase to 25 mg  BID x2 weeks then increase to 25 mg AM and 50 mg x 2 weeks PM then 50 mg BID 04/05/18   [provider]      VITAL SIGNS:  Blood pressure 134/86, pulse 71, temperature 98 F (36.7 C), temperature source Oral, resp. rate 20, height 5\' 9"  (1.753 m), weight (!) 201.9 kg, SpO2 99 %.  PHYSICAL EXAMINATION:   Physical Exam  GENERAL:  48 y.o.-year-old patient lying in the bed with no acute distress.  EYES: Pupils equal, round, reactive to light and accommodation. No scleral icterus. Extraocular muscles intact.  HEENT: Head atraumatic, normocephalic. Oropharynx and nasopharynx clear.  NECK:  Supple, no jugular venous distention. No thyroid enlargement, no tenderness.  LUNGS: Normal breath sounds bilaterally, no wheezing, rales,rhonchi or crepitation. No use of accessory muscles of respiration.  CARDIOVASCULAR: S1, S2 normal. No murmurs, rubs, or gallops.  ABDOMEN: Soft, nontender, nondistended. Bowel sounds present. No organomegaly or mass.  EXTREMITIES: No pedal  edema, cyanosis, or clubbing.  NEUROLOGIC: Cranial nerves II through XII are intact. Muscle strength 5/5 in all extremities. Sensation intact. Gait not checked.  PSYCHIATRIC: The patient is alert and oriented x 3.  SKIN: No obvious rash, lesion, or ulcer.   DATA REVIEWED:  LABORATORY PANEL:   CBC Recent Labs  Lab 06/09/19 2032  WBC 7.9  HGB 12.8  HCT 41.0  PLT 333   ------------------------------------------------------------------------------------------------------------------  Chemistries  Recent Labs  Lab 06/09/19 2032  NA 137  K 3.8  CL 102  CO2 25  GLUCOSE 120*  BUN 13  CREATININE 0.66  CALCIUM 9.4   ------------------------------------------------------------------------------------------------------------------  Cardiac Enzymes No results for input(s): TROPONINI in the last 168 hours.  ------------------------------------------------------------------------------------------------------------------  RADIOLOGY:  Dg Chest 2 View  Result Date: 06/10/2019 CLINICAL DATA:  Chest pain EXAM: CHEST - 2 VIEW COMPARISON:  01/18/2018 FINDINGS: Cardiomegaly with mild central vascular congestion. No consolidation or pleural effusion. No pneumothorax. IMPRESSION: Cardiomegaly with vascular congestion. Electronically Signed   By: Donavan Foil M.D.   On: 06/10/2019 00:26   Ct Head Wo Contrast  Result Date: 06/10/2019 CLINICAL DATA:  48 year old female with syncope. EXAM: CT HEAD WITHOUT CONTRAST TECHNIQUE: Contiguous axial images were obtained from the base of the skull through the vertex without intravenous contrast. COMPARISON:  Head CT 08/24/2017. FINDINGS: Brain: Normal cerebral volume. No midline shift, ventriculomegaly, mass effect, evidence of mass lesion, intracranial hemorrhage or evidence of cortically based acute infarction. Gray-white matter differentiation is within normal limits throughout the brain. Vascular: No suspicious intracranial vascular hyperdensity. Skull: Negative. Sinuses/Orbits: Visualized paranasal sinuses and mastoids are stable and well pneumatized. Other: Visualized orbits and scalp soft tissues are within normal limits. IMPRESSION: Stable and normal noncontrast CT appearance of the brain. Electronically Signed   By: Genevie Ann M.D.   On: 06/10/2019 03:19    EKG:   EKG is normal sinus rate of 73, no ST elevation, no T wave inversion, normal intervals except type I AV block  IMPRESSION AND PLAN:   48 y.o. female with pertinent past medical history of asthma, Barrett's esophagus, anxiety and depression, GERD, first-degree AV block, hypothyroidism, complicated migraine, restless leg syndrome, sleep apnea not on CPAP vitamin D deficiency presenting to the ED with syncope.  1. Syncope - Unclear etiology - Admit to Telemetry unit - CXR shows cardiomegaly with  vascular congestion - Check Orthostatic Vital Signs - Will obtain  Head CT - Cardiac Echo - Carotid Dopplers - U Tox + EtOH level - Check TSH, T4 - Cardiology Consult placed to Dr. Saunders Revel  2. Hypothyroidism - Continue Synthroid - Check TSH as above  3. Obstructive sleep apnea -not on CPAP - Encouraged to use CPAP at night  4. Complicated migraine - Continue zonisamide for migraine prophylaxis  5. RLS - Continue pramipexole  6. Anxiety and depression - Continue Zoloft  7. GERD- Protonix  8. DVT prophylaxis - Enoxaparin / Heparin SubQ if poor mobility and stay >24hrs   All the records are reviewed and case discussed with ED provider. Management plans discussed with the patient, family and they are in agreement.  CODE STATUS: FULL  TOTAL TIME TAKING CARE OF THIS PATIENT:50 minutes.    on 06/10/2019 at 6:30 AM   Rufina Falco, DNP, FNP-BC [facility] Nurse Practitioner Between 7am to 6pm - Pager [phone]  After 6pm go to [email or web address] - password [email or web address] [facility]  Office  [phone]  CC: Primary care physician; Zeb Comfort, MD

## 2019-06-10 NOTE — Progress Notes (Signed)
*  PRELIMINARY RESULTS* Echocardiogram 2D Echocardiogram has been performed.  Sherrie Sport 06/10/2019, 10:03 AM

## 2019-06-10 NOTE — Consult Note (Signed)
Cardiology Consultation:   Patient ID: Claudia Campbell; [id number]; 1971/08/21   Admit date: 06/09/2019 Date of Consult: 06/10/2019  Primary Care Provider: Zeb Comfort, MD Primary Cardiologist: new to [facility] - consult by End   Patient Profile:   Claudia Campbell is a 49 y.o. 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  History of Present Illness:   Claudia Campbell previously underwent remote treadmill stress test at [facility] in 1989 that was negative. No cardiac evaluation since on file for review. She indicates a long history of syncope that dates back to 80. At that time she underwent extensive work up at [facility] which she reports was normal. She reports it was ultimately determined her syncope was related to intake of "aspartame." Since then, she has continued to have intermittent syncopal episodes which she has described occurring when she "gets over heated" or in the setting of a complex migraine. Prior to her episode on 10/12, her last syncopal episode was ~ 12 months ago. Her syncopal episodes are described as sudden in onset and would typically last 2-3 minutes.    She was in her usual state of health on 10/12, having just finished dinner with her sister. While walking back to the car (her sister was driving) the patient began to feel like everything was spinning around her. Of note, this has been a common presyncopal symptom for her in the past. She sat down in the passenger seat and her sister began to drive to pick up some chairs from her parent's house, which is right around the corner from EMS, per  patient. The patient remembers the chairs being placed in the back of the car. It was apparently around this time when she passed out. Her sister drove her to the EMS station and the patient came to with the smell of ammonia. Following this, she reported a deep left sided chest pressure which was not associated with her presyncope. She denied any symptoms of palpitations, SOB, diaphoresis, nausea, or vomiting. Because of the chest discomfort, the family brought her to [facility].   Vitals stable with mild tachypnea. CT head was not acute. EKG showed NSR, 73 bpm, possible prior anterior infarct, no acute st/t changes, CXR showed cardiomegaly with possible vascular congestion. Labs showed negative high sensitivity troponin x 3, TSH 15.599, normal free T4, negative HCG, unremarkable CBC, potassium 3.8, SCr 0.66. Orthostatic vitals normal. She has not had any further syncope or presyncopal symptoms since her admission. She has continued to note intermittent left sided chest discomfort, though attributes this to prior exam during admission. She indicates no associated migraine or over heating episode with this syncope. She denies any alcohol, tobacco use, or illegal drugs. She typically gets migraines 1/week and most are manageable with her last migraine being on 10/8.   Past Medical History:  Diagnosis Date   Abnormal liver function test    Arthritis    Asthma    Barrett esophagus    Depression    First degree AV block    GERD (gastroesophageal reflux disease)    Hypothyroidism    Post ablation   Migraines    PONV (postoperative nausea and vomiting)  Restless leg    Sleep apnea    NO cpap at this time, broken   Tenosynovitis    Thyroid nodule    Vitamin D deficiency     Past Surgical History:  Procedure Laterality Date   BLADDER SUSPENSION  2011   BREAST RECONSTRUCTION     BUNIONECTOMY Bilateral    CAPSULOTOMY Left 01/23/2018   Procedure: CAPSULOTOMY SECOND LEFT;  Surgeon:  Edrick Kins, DPM;  Location: [facility] OR[facility];  Service: Podiatry;  Laterality: Left;   CHOLECYSTECTOMY     COLONOSCOPY     ESOPHAGOGASTRODUODENOSCOPY (EGD) WITH PROPOFOL N/A 01/18/2018   Procedure: ESOPHAGOGASTRODUODENOSCOPY (EGD) WITH PROPOFOL;  Surgeon: Lollie Sails, MD;  Location: [facility] ENDOSCOPY;  Service: Endoscopy;  Laterality: N/A;   EXCISION MORTON'S NEUROMA     EXPLORATORY LAPAROTOMY     HAMMER TOE SURGERY Left 01/23/2018   Procedure: HAMMER TOE CORRECTION SECOND LEFT;  Surgeon: Edrick Kins, DPM;  Location: [facility];  Service: Podiatry;  Laterality: Left;   HYSTEROSCOPY W/ ENDOMETRIAL ABLATION     KNEE ARTHROSCOPY Left    MENISECTOMY     x2   METATARSAL OSTEOTOMY Left 01/23/2018   Procedure: METATARSAL OSTEOTOMY SECOND LEFT;  Surgeon: Edrick Kins, DPM;  Location: [facility];  Service: Podiatry;  Laterality: Left;   monitoring cranial nerves Bilateral    OSTECTOMY CALCANEUS Left    RAI ablation  2007   TENOTOMY Left    toe   TONSILLECTOMY AND ADENOIDECTOMY  10/1981   topaz Left    heel   TOTAL THYROIDECTOMY  10/12/2017   TOTAL THYROIDECTOMY     TUBAL LIGATION     UPPER GI ENDOSCOPY       Home Meds: Prior to Admission medications   Medication Sig Start Date End Date Taking? Authorizing Provider  doxycycline (VIBRAMYCIN) 100 MG capsule Take 1 capsule by mouth daily with supper. 05/09/19  Yes [provider]  esomeprazole (NEXIUM) 40 MG capsule Take 40 mg by mouth 2 (two) times daily before a meal.  01/04/18  Yes [provider]  montelukast (SINGULAIR) 10 MG tablet Take 10 mg by mouth at bedtime.  01/04/18  Yes [provider]  pramipexole (MIRAPEX) 1 MG tablet Take 2 mg by mouth at bedtime.  01/04/18  Yes [provider]  sertraline (ZOLOFT) 100 MG tablet Take 150 mg by mouth daily.  04/27/19  Yes [provider]  SYNTHROID 200 MCG tablet Take 200 mcg by mouth daily before breakfast.  04/27/19  Yes [provider]  zonisamide (ZONEGRAN) 100 MG capsule Take 100 mg by mouth daily.   Yes [provider]  topiramate (TOPAMAX) 25 MG tablet Take 25 mg at night x 2 weeks then increase to 25 mg BID x2 weeks then increase to 25 mg AM and 50 mg x 2 weeks PM then 50 mg BID 04/05/18   [provider]    Inpatient Medications: Scheduled Meds:  [START ON 06/11/2019] doxycycline  100 mg Oral Q supper   [START ON 06/11/2019] enoxaparin (LOVENOX) injection  40 mg Subcutaneous Q24H   levothyroxine  200 mcg Oral QAC breakfast   meloxicam  15 mg Oral Daily   montelukast  10 mg Oral QHS   pantoprazole  40 mg Oral Daily   pramipexole  2 mg Oral QHS   sertraline  150 mg Oral Daily   sodium chloride flush  3 mL Intravenous Q12H   zonisamide  100 mg Oral Daily   Continuous Infusions:  PRN Meds:   Allergies:   Allergies  Allergen Reactions   Aspartame Other (See Comments)    Passes out   Ciprofloxacin Hives   Latex Swelling    Around mouth   Dicyclomine Palpitations    Social History:   Social History   Socioeconomic History   Marital status: Single    Spouse name: Not on file   Number of children: Not on file   Years of education: Not on file   Highest education level: Not on file  Occupational History   Not on file  Social Needs   Financial resource strain: Not on file   Food insecurity    Worry: Not on file    Inability: Not on file   Transportation needs    Medical: Not on file    Non-medical: Not on file  Tobacco Use   Smoking status: Never Smoker   Smokeless tobacco: Never Used  Substance and Sexual Activity   Alcohol use: No   Drug use: No   Sexual activity: Yes    Birth control/protection: Surgical  Lifestyle   Physical activity    Days per week: Not on file    Minutes per session: Not on file   Stress: Not on file  Relationships   Social connections    Talks on phone: Not on file    Gets together: Not on file     Attends religious service: Not on file    Active member of club or organization: Not on file    Attends meetings of clubs or organizations: Not on file    Relationship status: Not on file   Intimate partner violence    Fear of current or ex partner: Not on file    Emotionally abused: Not on file    Physically abused: Not on file    Forced sexual activity: Not on file  Other Topics Concern   Not on file  Social History Narrative   Not on file     Family History:   Family History  Problem Relation Age of Onset   Hypertension Father    Hyperlipidemia Father    Alcohol abuse Father    Sleep apnea Father    Colon polyps Father    Obesity Father    CAD Maternal Grandfather    Alcohol abuse Maternal Grandfather    Skin cancer Maternal Grandfather    Pancreatic cancer Maternal Grandmother    Thyroid disease Maternal Grandmother    Rheum arthritis Mother    Skin cancer Mother    Alcohol abuse Paternal Grandfather    Colon cancer Paternal Grandfather    Goiter Paternal Grandfather    Heart disease Paternal Grandfather    Alcohol abuse Paternal Grandmother    Thyroid disease Paternal Grandmother    Asthma Sister    Depression Sister    Obesity Sister    Sleep apnea Sister    Depression Sister     ROS:  Review of Systems  Constitutional: Positive for malaise/fatigue. Negative for chills, diaphoresis, fever and weight loss.  HENT: Negative for congestion.   Eyes: Negative for discharge and redness.  Respiratory: Negative for cough, hemoptysis, sputum production, shortness of breath and wheezing.   Cardiovascular: Positive for chest pain. Negative for palpitations, orthopnea, claudication, leg swelling and PND.  Gastrointestinal: Negative for abdominal pain, blood in stool, heartburn, melena, nausea and vomiting.  Genitourinary: Negative for hematuria.  Musculoskeletal: Positive for myalgias. Negative for falls.  Skin: Negative for rash.  Neurological: Positive for dizziness, loss of consciousness, weakness and headaches. Negative for tingling, tremors, sensory change, speech change, focal weakness and seizures.  Endo/Heme/Allergies: Does not bruise/bleed easily.  Psychiatric/Behavioral: Negative for substance abuse. The patient is not nervous/anxious.   All other systems reviewed and are negative.     Physical Exam/Data:   Vitals:   06/10/19 0700 06/10/19 0730 06/10/19 0800 06/10/19 1023  BP: 132/90 112/79 (!) 131/93 (!) 135/96  Pulse: 78 81 74 81  Resp: (!) 21 (!) 23 (!) 23 (!) 22  Temp:      TempSrc:      SpO2: 92% 96% 98% 92%  Weight:      Height:       No intake or output data in the 24 hours ending 06/10/19 1156 Filed Weights   06/09/19 2029  Weight: (!) 201.9 kg   Body mass index is 65.72 kg/m.   Physical Exam: General: Well developed, well nourished, in no acute distress. Head: Normocephalic, atraumatic, sclera non-icteric, no xanthomas, nares without discharge.  Neck: Negative for carotid bruits. JVD not elevated. Lungs: Clear bilaterally to auscultation without wheezes, rales, or rhonchi. Breathing is unlabored. Heart: RRR with S1 S2. No murmurs, rubs, or gallops appreciated. Abdomen: Morbidly obese, soft, non-tender, non-distended with normoactive bowel sounds. No hepatomegaly. No rebound/guarding. No obvious abdominal masses. Msk:  Strength and tone appear normal for age. Extremities: No clubbing or cyanosis. No edema. Distal pedal pulses are 2+ and equal bilaterally. Neuro: Alert and oriented X 3. No facial asymmetry. No focal deficit. Moves all extremities spontaneously. Psych:  Responds to questions appropriately with a normal affect.   EKG:  The EKG was personally reviewed and demonstrates: NSR, 73 bpm, possible prior anterior infarct, no acute st/t changes Telemetry:  Telemetry was personally reviewed and demonstrates: SR to sinus tachycardia, 80s to 120s bpm  Weights: Filed Weights    06/09/19 2029  Weight: (!) 201.9 kg    Relevant CV Studies: 2D Echo 06/10/2019: 1. Left ventricular ejection fraction, by visual estimation, is 65 to 70%. The left ventricle has normal function. Normal left ventricular size. There is moderately increased left ventricular hypertrophy.  2. Global right ventricle has normal systolic function.The right ventricular size is not well visualized. Mildly increased right ventricular wall thickness.  3. Left atrial size was not well visualized.  4. Right atrial size was not well visualized.  5. The pericardium was not well visualized.  6. The mitral valve was not well visualized. No evidence of mitral valve regurgitation.  7. The tricuspid valve is not well visualized. Tricuspid valve regurgitation was not visualized by color flow Doppler.  8. The aortic valve was not well visualized Aortic valve regurgitation was not assessed by color flow Doppler.  9. The pulmonic valve was not well visualized. Pulmonic valve regurgitation is not visualized by color flow Doppler. 10. The interatrial septum was not well visualized.  Laboratory Data:  Chemistry Recent Labs  Lab 06/09/19 2032  NA 137  K 3.8  CL 102  CO2 25  GLUCOSE 120*  BUN 13  CREATININE 0.66  CALCIUM 9.4  GFRNONAA >60  GFRAA >60  ANIONGAP 10    No results for input(s): PROT, ALBUMIN, AST, ALT, ALKPHOS, BILITOT in the last 168 hours. Hematology Recent Labs  Lab 06/09/19 2032  WBC 7.9  RBC 4.72  HGB 12.8  HCT 41.0  MCV 86.9  MCH 27.1  MCHC 31.2  RDW 14.9  PLT 333   Cardiac EnzymesNo results for input(s): TROPONINI  in the last 168 hours. No results for input(s): TROPIPOC in the last 168 hours.  BNPNo results for input(s): BNP, PROBNP in the last 168 hours.  DDimer No results for input(s): DDIMER in the last 168 hours.  Radiology/Studies:  Dg Chest 2 View  Result Date: 06/10/2019 IMPRESSION: Cardiomegaly with vascular congestion. Electronically Signed   By: Donavan Foil  M.D.   On: 06/10/2019 00:26   Ct Head Wo Contrast  Result Date: 06/10/2019 IMPRESSION: Stable and normal noncontrast CT appearance of the brain. Electronically Signed   By: Genevie Ann M.D.   On: 06/10/2019 03:19    Assessment and Plan:   1. Syncope: -Uncertain etiology  -She reports a long history of syncope that dates back to 1987 with prior extensive work up at [facility] being unrevealing, per her report with ultimate etiology reportedly related to "aspartame" initially though she continued to note syncope associated with "over heating" and complex migraines -She denies any of her typical triggers  -Echo with preserved LV systolic function as above -Carotid artery ultrasound pending -Monitor on telemetry, if no significant arrhythmia or pause is noted while admitted she will need real time 14-day Zio monitor x 2 placed at discharge -Not a great candidate for ischemic evaluation given habitus  -No driving x 6 months -Orthostatic vitals normal -Recommend EtOH, UDS per IM -IV fluids -Ambulate  -Once she gets her monitor, ok to go home from cardiology perspective per MD  2. Chest pain: -Ruled out -EKG not acute -No indication for heparin gtt -Given her body habitus, noninvasive modalities including Lexiscan Myoview and coronary CTA, are quite limited -I do not think cath is indicted at this time, will discuss with MD -Chest pain has been reproducible to palpation   3. Morbid obesity with OSA and likely OHS: -Weight loss advised -Not compliant with CPAP, compliance advised   4. Post ablative hypothyroidism: -Not complaint with replacement therapy  -Per IM     For questions or updates, please contact [place] Please consult [email or web address] for contact info under Cardiology/STEMI.   Signed, Christell Faith, PA-C [facility] Pager: [phone] 06/10/2019, 11:56 AM

## 2019-06-10 NOTE — ED Notes (Signed)
This RN gave pt medications per MD order. Pt refused MOBIC due to other medications she is on interacting. Pt resting comfortably and denies any needs at this time. Pt's lights turned off to promote comfort. Will continue to monitor.

## 2019-06-10 NOTE — ED Notes (Signed)
Gave pt lunch tray with juice. 

## 2019-06-10 NOTE — Discharge Instructions (Signed)
It was so nice to meet you during this hospitalization!  You came into the hospital with passing out. We did an ultrasound of your carotid arteries and an ultrasound of your heart, both of which were normal. You were seen by the heart doctors, who wanted you to have a zio monitor placed for 14 days to monitor your heart rhythm all throughout the day.  Your thyroid labs were abnormal- please make sure you take your synthroid every day as prescribed.  Take care, Dr. Brett Albino

## 2019-06-11 LAB — T3: T3, Total: 107 ng/dL (ref 71–180)

## 2019-06-11 NOTE — Telephone Encounter (Signed)
[employer]IO calling to update ZIO has been placed on hold - pending patient response

## 2019-06-13 ENCOUNTER — Telehealth: Payer: Self-pay | Admitting: Cardiology

## 2019-06-13 DIAGNOSIS — G4730 Sleep apnea, unspecified: Secondary | ICD-10-CM

## 2019-06-13 NOTE — Telephone Encounter (Signed)
Contacted by monitoring company this morning.  Patient had a 3.1-second pause followed by a 3.7-second pause.  She apparently was just waking up.  No symptoms.  I will forward results to Dr. Saunders Revel.  Continue monitoring for history of syncope. Kirk Ruths

## 2019-06-13 NOTE — Telephone Encounter (Signed)
I spoke with Claudia Campbell, who reports feeling well.  Bradycardia noted on event monitor occurred around the time that her alarm was going off this morning.  She admits to a history of sleep apnea and does not have a functional CPAP at this time.  She has not had any further lightheadedness or syncope since hospitalization.  We will refer her to pulmonary for sleep evaluation.  Nelva Bush, MD [facility] Pager: [phone]

## 2019-06-15 ENCOUNTER — Telehealth: Payer: Self-pay | Admitting: Cardiology

## 2019-06-15 NOTE — Telephone Encounter (Signed)
Discussed strips with Dr. Sallyanne Kuster - pt is asymptomatic now, I called her and isnstructed, no driving and if she became symptomatic to call EMS other wise the office would call tomorrow with further instructions.  The last  episode with during the day

## 2019-06-15 NOTE — Telephone Encounter (Signed)
Pt with 3 back to back pauses first 2 in the 3 sec area and last one 5 sec.  Pt is alert without dizziness or complaints, no syncope.  She was instructed not to drive.

## 2019-06-15 NOTE — Telephone Encounter (Signed)
I returned a phone call to [employer] about this patient. She was observed to have a 7.0 second pause on her cardiac monitor. Attempt to reach the patient by the company was unsuccessful, and a message was left on her voicemail. I tried calling her myself, and she did not answer.

## 2019-06-16 ENCOUNTER — Telehealth: Payer: Self-pay | Admitting: Internal Medicine

## 2019-06-16 NOTE — Telephone Encounter (Signed)
Spoke with patient. She confirms she knows she is not to be driving. Patient is agreeable to come in tomorrow morning to see Dr Caryl Comes, appt ok per Nira Conn, RN. Patient aware of process of coming in through the [place].

## 2019-06-16 NOTE — Telephone Encounter (Signed)
Though I suspect sleep apnea and obesity hypoventilation underlie her pauses, given frequent and prolonged nature (up to 7 seconds), I recommend that we make arrangements for her to be seen by EP as soon as possible.  Ms. Claudia Campbell should refrain from driving.  Nelva Bush, MD [facility] Pager: [phone]

## 2019-06-16 NOTE — Addendum Note (Signed)
Addended by: Vanessa Ralphs on: 06/16/2019 09:33 AM   Modules accepted: Orders

## 2019-06-16 NOTE — Telephone Encounter (Signed)
Referral to pulmonology entered. Routing to pulmonology pool for scheduling.

## 2019-06-16 NOTE — Telephone Encounter (Signed)
Contacted by answering service, returned call to [employer]. Patient noted to have two pauses overnight, 3.3 and 3.8 seconds. Asymptomatic. Has already been instructed not to drive by Cecilie Kicks, see notes.  Buford Dresser, MD, PhD [facility]  [address] [place],  [address] [phone]

## 2019-06-16 NOTE — Telephone Encounter (Signed)
Kim, can you help with this. Thanks

## 2019-06-17 ENCOUNTER — Other Ambulatory Visit: Payer: Self-pay

## 2019-06-17 ENCOUNTER — Encounter: Payer: Self-pay | Admitting: Internal Medicine

## 2019-06-17 ENCOUNTER — Ambulatory Visit (INDEPENDENT_AMBULATORY_CARE_PROVIDER_SITE_OTHER): Payer: BC Managed Care – PPO | Admitting: Internal Medicine

## 2019-06-17 VITALS — BP 123/77 | HR 73 | Ht 69.0 in | Wt >= 6400 oz

## 2019-06-17 DIAGNOSIS — I495 Sick sinus syndrome: Secondary | ICD-10-CM

## 2019-06-17 NOTE — Patient Instructions (Signed)
Medication Instructions:  - Your physician recommends that you continue on your current medications as directed. Please refer to the Current Medication list given to you today.  *If you need a refill on your cardiac medications before your next appointment, please call your pharmacy*  Lab Work: - none ordered  If you have labs (blood work) drawn today and your tests are completely normal, you will receive your results only by: Marland Kitchen MyChart Message (if you have MyChart) OR . A paper copy in the mail If you have any lab test that is abnormal or we need to change your treatment, we will call you to review the results.  Testing/Procedures: - none ordered  Follow-Up: At [facility], you and your health needs are our priority.  As part of our continuing mission to provide you with exceptional heart care, we have created designated Provider Care Teams.  These Care Teams include your primary Cardiologist (physician) and Advanced Practice Providers (APPs -  Physician Assistants and Nurse Practitioners) who all work together to provide you with the care you need, when you need it.  Your next appointment:   as needed with Dr. Caryl Comes  The format for your next appointment:   n/a  Provider:   n/a  Other Instructions - n/a

## 2019-06-17 NOTE — Progress Notes (Signed)
ELECTROPHYSIOLOGY CONSULT NOTE  Patient ID: Claudia Campbell, MRN: [id number], DOB/AGE: 09-07-1970 48 y.o. Admit date: (Not on file) Date of Consult: 06/17/2019  Primary Physician: Zeb Comfort, MD Primary Cardiologist: none      Claudia Campbell is a 48 y.o. female who is being seen today for the evaluation ofsyncope  at the request of Dr Pricilla Riffle .    HPI Claudia Campbell is a 48 y.o. female referred for evaluation of syncope  The event began after meal>> LH   Got in car and riding with sister>> progressive LH, described as spinning, accompanied by nausea, but no diaphoresis.  Symptoms worsened and then LOC in the car  She was driven to EMS with regaining of consciousness about 10 min later with profound recovery fatigue.  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  Evaluated extensively at [facility]     Past Medical History:  Diagnosis Date  . Abnormal liver function test   . Arthritis   . Asthma   . Barrett esophagus   . Depression   . First degree AV block   . GERD (gastroesophageal reflux disease)   . Hypothyroidism    Post ablation  . Migraines   . PONV (postoperative nausea and vomiting)   . Restless leg   . Sleep apnea    NO cpap at this time, broken  . Tenosynovitis   . Thyroid nodule   . Vitamin D deficiency       Surgical History:  Past Surgical History:  Procedure Laterality Date  . BLADDER SUSPENSION  2011  . BREAST RECONSTRUCTION    . BUNIONECTOMY  Bilateral   . CAPSULOTOMY Left 01/23/2018   Procedure: CAPSULOTOMY SECOND LEFT;  Surgeon: Edrick Kins, DPM;  Location: [facility];  Service: Podiatry;  Laterality: Left;  . CHOLECYSTECTOMY    . COLONOSCOPY    . ESOPHAGOGASTRODUODENOSCOPY (EGD) WITH PROPOFOL N/A 01/18/2018   Procedure: ESOPHAGOGASTRODUODENOSCOPY (EGD) WITH PROPOFOL;  Surgeon: Lollie Sails, MD;  Location: [facility];  Service: Endoscopy;  Laterality: N/A;  . EXCISION MORTON'S NEUROMA    . EXPLORATORY LAPAROTOMY    . HAMMER TOE SURGERY Left 01/23/2018   Procedure: HAMMER TOE CORRECTION SECOND LEFT;  Surgeon: Edrick Kins, DPM;  Location: [facility];  Service: Podiatry;  Laterality: Left;  . HYSTEROSCOPY W/ ENDOMETRIAL ABLATION    . KNEE ARTHROSCOPY Left   . MENISECTOMY     x2  . METATARSAL OSTEOTOMY Left 01/23/2018   Procedure: METATARSAL OSTEOTOMY SECOND LEFT;  Surgeon: Edrick Kins, DPM;  Location: [facility];  Service: Podiatry;  Laterality: Left;  . monitoring cranial nerves Bilateral   . OSTECTOMY CALCANEUS Left   . RAI ablation  2007  . TENOTOMY Left    toe  . TONSILLECTOMY AND ADENOIDECTOMY  10/1981  . topaz Left    heel  . TOTAL THYROIDECTOMY  10/12/2017  .  TOTAL THYROIDECTOMY    . TUBAL LIGATION    . UPPER GI ENDOSCOPY       Home Meds: Current Meds  Medication Sig  . esomeprazole (NEXIUM) 40 MG capsule Take 40 mg by mouth 2 (two) times daily before a meal.   . montelukast (SINGULAIR) 10 MG tablet Take 10 mg by mouth at bedtime.   . pramipexole (MIRAPEX) 1 MG tablet Take 2 mg by mouth at bedtime.   . sertraline (ZOLOFT) 100 MG tablet Take 150 mg by mouth daily.   Marland Kitchen SYNTHROID 200 MCG tablet Take 200 mcg by mouth daily before breakfast.   . zonisamide (ZONEGRAN) 100 MG capsule Take 100 mg by mouth daily.    Allergies:  Allergies  Allergen Reactions  . Aspartame Other (See Comments)    Passes out  . Ciprofloxacin Hives  . Latex Swelling    Around mouth  . Dicyclomine Palpitations    Social  History   Socioeconomic History  . Marital status: Single    Spouse name: Not on file  . Number of children: Not on file  . Years of education: Not on file  . Highest education level: Not on file  Occupational History  . Not on file  Social Needs  . Financial resource strain: Not on file  . Food insecurity    Worry: Not on file    Inability: Not on file  . Transportation needs    Medical: Not on file    Non-medical: Not on file  Tobacco Use  . Smoking status: Never Smoker  . Smokeless tobacco: Never Used  Substance and Sexual Activity  . Alcohol use: No  . Drug use: No  . Sexual activity: Yes    Birth control/protection: Surgical  Lifestyle  . Physical activity    Days per week: Not on file    Minutes per session: Not on file  . Stress: Not on file  Relationships  . Social Herbalist on phone: Not on file    Gets together: Not on file    Attends religious service: Not on file    Active member of club or organization: Not on file    Attends meetings of clubs or organizations: Not on file    Relationship status: Not on file  . Intimate partner violence    Fear of current or ex partner: Not on file    Emotionally abused: Not on file    Physically abused: Not on file    Forced sexual activity: Not on file  Other Topics Concern  . Not on file  Social History Narrative  . Not on file     Family History  Problem Relation Age of Onset  . Hypertension Father   . Hyperlipidemia Father   . Alcohol abuse Father   . Sleep apnea Father   . Colon polyps Father   . Obesity Father   . CAD Maternal Grandfather   . Alcohol abuse Maternal Grandfather   . Skin cancer Maternal Grandfather   . Pancreatic cancer Maternal Grandmother   . Thyroid disease Maternal Grandmother   . Rheum arthritis Mother   . Skin cancer Mother   . Alcohol abuse Paternal Grandfather   . Colon cancer Paternal Grandfather   . Goiter Paternal Grandfather   . Heart disease Paternal  Grandfather   . Alcohol abuse Paternal Grandmother   . Thyroid disease Paternal Grandmother   . Asthma Sister   . Depression Sister   . Obesity  Sister   . Sleep apnea Sister   . Depression Sister      ROS:  Please see the history of present illness.     All other systems reviewed and negative.    Physical Exam: Blood pressure 123/77, pulse 73, height 5\' 9"  (1.753 m), weight (!) 439 lb 8 oz (199.4 kg), SpO2 99 %. General: Well developed, Morbidly obese  female in no acute distress. Head: Normocephalic, atraumatic, sclera non-icteric, no xanthomas, nares are without discharge. EENT: normal  Lymph Nodes:  none Neck: Negative for carotid bruits. JVD not elevated. Back:without scoliosis kyphosis Lungs: Clear bilaterally to auscultation without wheezes, rales, or rhonchi. Breathing is unlabored. Heart: RRR with S1 S2. No murmur . No rubs, or gallops appreciated. Abdomen: Soft, non-tender, non-distended with normoactive bowel sounds. No hepatomegaly. No rebound/guarding. No obvious abdominal masses. Msk:  Strength and tone appear normal for age. Extremities: No clubbing or cyanosis. No edema.  Distal pedal pulses are 2+ and equal bilaterally. Skin: Warm and Dry Neuro: Alert and oriented X 3. CN III-XII intact Grossly normal sensory and motor function . Psych:  Responds to questions appropriately with a normal affect.      Labs: Cardiac Enzymes No results for input(s): CKTOTAL, CKMB, TROPONINI in the last 72 hours. CBC Lab Results  Component Value Date   WBC 7.9 06/09/2019   HGB 12.8 06/09/2019   HCT 41.0 06/09/2019   MCV 86.9 06/09/2019   PLT 333 06/09/2019   PROTIME: No results for input(s): LABPROT, INR in the last 72 hours. Chemistry No results for input(s): NA, K, CL, CO2, BUN, CREATININE, CALCIUM, PROT, BILITOT, ALKPHOS, ALT, AST, GLUCOSE in the last 168 hours.  Invalid input(s): LABALBU Lipids No results found for: CHOL, HDL, LDLCALC, TRIG BNP No results found for:  PROBNP Thyroid Function Tests: No results for input(s): TSH, T4TOTAL, T3FREE, THYROIDAB in the last 72 hours.  Invalid input(s): FREET3 Miscellaneous No results found for: DDIMER  Radiology/Studies:  Dg Chest 2 View  Result Date: 06/10/2019 CLINICAL DATA:  Chest pain EXAM: CHEST - 2 VIEW COMPARISON:  01/18/2018 FINDINGS: Cardiomegaly with mild central vascular congestion. No consolidation or pleural effusion. No pneumothorax. IMPRESSION: Cardiomegaly with vascular congestion. Electronically Signed   By: Donavan Foil M.D.   On: 06/10/2019 00:26   Ct Head Wo Contrast  Result Date: 06/10/2019 CLINICAL DATA:  48 year old female with syncope. EXAM: CT HEAD WITHOUT CONTRAST TECHNIQUE: Contiguous axial images were obtained from the base of the skull through the vertex without intravenous contrast. COMPARISON:  Head CT 08/24/2017. FINDINGS: Brain: Normal cerebral volume. No midline shift, ventriculomegaly, mass effect, evidence of mass lesion, intracranial hemorrhage or evidence of cortically based acute infarction. Gray-white matter differentiation is within normal limits throughout the brain. Vascular: No suspicious intracranial vascular hyperdensity. Skull: Negative. Sinuses/Orbits: Visualized paranasal sinuses and mastoids are stable and well pneumatized. Other: Visualized orbits and scalp soft tissues are within normal limits. IMPRESSION: Stable and normal noncontrast CT appearance of the brain. Electronically Signed   By: Genevie Ann M.D.   On: 06/10/2019 03:19   US Carotid Bilateral  Result Date: 06/10/2019 CLINICAL DATA:  Syncope EXAM: BILATERAL CAROTID DUPLEX ULTRASOUND TECHNIQUE: Pearline Cables scale imaging, color Doppler and duplex ultrasound were performed of bilateral carotid and vertebral arteries in the neck. COMPARISON:  None. FINDINGS: Criteria: Quantification of carotid stenosis is based on velocity parameters that correlate the residual internal carotid diameter with NASCET-based stenosis  levels, using the diameter of the distal internal carotid lumen as the denominator  for stenosis measurement. The following velocity measurements were obtained: RIGHT ICA: 79/35 cm/sec CCA: [id number] cm/sec SYSTOLIC ICA/CCA RATIO:  1.0 ECA: 100 cm/sec LEFT ICA: 77/27 cm/sec CCA: [id number] cm/sec SYSTOLIC ICA/CCA RATIO:  0.9 ECA: 109 cm/sec RIGHT CAROTID ARTERY: No significant plaque accumulation or stenosis. Normal waveforms and color Doppler signal. RIGHT VERTEBRAL ARTERY:  Normal flow direction and waveform. LEFT CAROTID ARTERY: No plaque or stenosis. Normal waveforms and color Doppler signal. LEFT VERTEBRAL ARTERY:  Normal flow direction and waveform. Subcentimeter left supraclavicular lymph node incidentally noted. IMPRESSION: Negative.  No significant carotid plaque or stenosis. Electronically Signed   By: Lucrezia Europe M.D.   On: 06/10/2019 13:24    EKG: Sinus at 73 Interval 22/07/39 First-degree AV block Inferior Q waves   Assessment and Plan:  Syncope-neurally mediated  Obstructive sleep apnea with nocturnal bradycardia  Morbidly obese  Thyroid dysfunction  Migraines-hemiplegic    The patient has a history of spells that dates back about 35 years when she was seen at [facility].  These were thought to be environmental.  Not sure how they were related.  She has a history of heat intolerance but associated with a prodrome distinct from the most recent event.  Most recent event is likely neurally mediated; these are sometimes associated with vertigo.  The protracted fatigue afterwards and the concomitant nausea are strong supporters of a neurally mediated epiphenomenon  With her normal ultrasound and normal echocardiogram and arrhythmic cause of protracted syncope is exceedingly unlikely.  We have discussed the importance of recognition of the prodrome and the value of recumbency as opposed to ongoing sitting.  She has known obstructive sleep apnea without therapy as her machine is old and broken.   This is almost certainly the cause of her nocturnal pausing which is associated with progressive PP prolongation  Given her weight have encouraged her to be attentive to that.  She is working with a Tax adviser at [employer].    Virl Axe

## 2019-06-20 ENCOUNTER — Telehealth: Payer: Self-pay | Admitting: Internal Medicine

## 2019-06-20 NOTE — Telephone Encounter (Signed)
[employer] calling to make office aware Patient's device has not been transmitting They have made several attempts to contact patient with no success

## 2019-06-24 NOTE — Telephone Encounter (Signed)
Patrice/Kim:  Is there any status update on the referral mentioned below? Thank you much.

## 2019-06-27 NOTE — Telephone Encounter (Signed)
Checked [employer] website and the report is "processing data".  Called and s/w [employer] rep who said the monitor was received in the mail yesterday and is in process. Originally, patient was to wear monitor for 1 month. Patient has appointment with Dr End on 07/04/2019. Routing to him to confirm if patient needs to wear another monitor or is one is sufficient at this time.

## 2019-06-30 NOTE — Telephone Encounter (Signed)
We can defer placement of another monitor until we have been able to review tracings.  Nelva Bush, MD [facility] Pager: [phone]

## 2019-07-01 NOTE — Telephone Encounter (Signed)
Monitor results uploaded to patient's chart.

## 2019-07-02 ENCOUNTER — Ambulatory Visit: Payer: BC Managed Care – PPO | Admitting: Internal Medicine

## 2019-07-03 NOTE — Telephone Encounter (Signed)
Patient being seen at [facility] - called and spoke to pt - closed referral -pr

## 2019-07-04 ENCOUNTER — Encounter: Payer: Self-pay | Admitting: Internal Medicine

## 2019-07-04 ENCOUNTER — Other Ambulatory Visit: Payer: Self-pay

## 2019-07-04 ENCOUNTER — Ambulatory Visit (INDEPENDENT_AMBULATORY_CARE_PROVIDER_SITE_OTHER): Payer: BC Managed Care – PPO | Admitting: Internal Medicine

## 2019-07-04 VITALS — BP 124/84 | HR 92 | Ht 69.0 in | Wt >= 6400 oz

## 2019-07-04 DIAGNOSIS — I495 Sick sinus syndrome: Secondary | ICD-10-CM | POA: Diagnosis not present

## 2019-07-04 DIAGNOSIS — G4733 Obstructive sleep apnea (adult) (pediatric): Secondary | ICD-10-CM

## 2019-07-04 DIAGNOSIS — R55 Syncope and collapse: Secondary | ICD-10-CM | POA: Diagnosis not present

## 2019-07-04 NOTE — Progress Notes (Signed)
Follow-up Outpatient Visit Date: 07/04/2019  Primary Care Provider: Zeb Comfort, MD [address] [place] [address]  Chief Complaint: Follow-up syncope  HPI:  Ms. Drakeford is a 48 y.o. year-old female with history of syncope, morbid obesity, obstructive sleep apnea not on CPAP, hemiplegic migraine disorder, post ablative hypothyroidism, restless leg syndrome, Barrett's esophagus, nephrolithiasis, bilateral occipital neuralgia, osteoarthritis, anxiety, depression, and GERD, who presents for follow-up of syncope.  I met her last month after an episode of syncope.  No clear etiology was identified.  Subsequent monitor showed multiple pauses up to 7 seconds, primarily at night while asleep.  She was seen by Dr. Caryl Comes on 06/17/2019, who felt that her monitor findings were likely neurally mediated in the setting of sleep apnea.  Pacemaker was not recommended.  Today, Ms. Sides feels well. She has not had any further episodes of lightheadedness or syncope. She notes that some of the alerts from [employer] occurred while she was awake, at work. She reports occasional brief palpitations without accompanying symptoms. She denies chest pain. Chronic exertional dyspnea is stable. She is trying to lose weight. She had not heard anything from our sleep medicine referral after hospitalization last month and has now been referred to [facility] by her PCP.  --------------------------------------------------------------------------------------------------  Past Medical History:  Diagnosis Date  . Abnormal liver function test   . Arthritis   . Asthma   . Barrett esophagus   . Depression   . First degree AV block   . GERD (gastroesophageal reflux disease)   . Hypothyroidism    Post ablation  . Migraines   . PONV (postoperative nausea and vomiting)   . Restless leg   . Sleep apnea    NO cpap at this time, broken  . Tenosynovitis   . Thyroid nodule   . Vitamin D deficiency    Past Surgical History:   Procedure Laterality Date  . BLADDER SUSPENSION  2011  . BREAST RECONSTRUCTION    . BUNIONECTOMY Bilateral   . CAPSULOTOMY Left 01/23/2018   Procedure: CAPSULOTOMY SECOND LEFT;  Surgeon: Edrick Kins, DPM;  Location: [facility];  Service: Podiatry;  Laterality: Left;  . CHOLECYSTECTOMY    . COLONOSCOPY    . ESOPHAGOGASTRODUODENOSCOPY (EGD) WITH PROPOFOL N/A 01/18/2018   Procedure: ESOPHAGOGASTRODUODENOSCOPY (EGD) WITH PROPOFOL;  Surgeon: Lollie Sails, MD;  Location: [facility] ENDOSCOPY;  Service: Endoscopy;  Laterality: N/A;  . EXCISION MORTON'S NEUROMA    . EXPLORATORY LAPAROTOMY    . HAMMER TOE SURGERY Left 01/23/2018   Procedure: HAMMER TOE CORRECTION SECOND LEFT;  Surgeon: Edrick Kins, DPM;  Location: [facility];  Service: Podiatry;  Laterality: Left;  . HYSTEROSCOPY W/ ENDOMETRIAL ABLATION    . KNEE ARTHROSCOPY Left   . MENISECTOMY     x2  . METATARSAL OSTEOTOMY Left 01/23/2018   Procedure: METATARSAL OSTEOTOMY SECOND LEFT;  Surgeon: Edrick Kins, DPM;  Location: [facility];  Service: Podiatry;  Laterality: Left;  . monitoring cranial nerves Bilateral   . OSTECTOMY CALCANEUS Left   . RAI ablation  2007  . TENOTOMY Left    toe  . TONSILLECTOMY AND ADENOIDECTOMY  10/1981  . topaz Left    heel  . TOTAL THYROIDECTOMY  10/12/2017  . TOTAL THYROIDECTOMY    . TUBAL LIGATION    . UPPER GI ENDOSCOPY      Current Meds  Medication Sig  . esomeprazole (NEXIUM) 40 MG capsule Take 40 mg by mouth 2 (two) times daily before a  meal.   . montelukast (SINGULAIR) 10 MG tablet Take 10 mg by mouth at bedtime.   . pramipexole (MIRAPEX) 1 MG tablet Take 2 mg by mouth at bedtime.   . sertraline (ZOLOFT) 100 MG tablet Take 150 mg by mouth daily.   Marland Kitchen SYNTHROID 200 MCG tablet Take 200 mcg by mouth daily before breakfast.   . zonisamide (ZONEGRAN) 100 MG capsule Take 100 mg by mouth daily.    Allergies: Aspartame, Ciprofloxacin, Latex, and Dicyclomine  Social History   Tobacco Use  . Smoking  status: Never Smoker  . Smokeless tobacco: Never Used  Substance Use Topics  . Alcohol use: No  . Drug use: No    Family History  Problem Relation Age of Onset  . Hypertension Father   . Hyperlipidemia Father   . Alcohol abuse Father   . Sleep apnea Father   . Colon polyps Father   . Obesity Father   . CAD Maternal Grandfather   . Alcohol abuse Maternal Grandfather   . Skin cancer Maternal Grandfather   . Pancreatic cancer Maternal Grandmother   . Thyroid disease Maternal Grandmother   . Rheum arthritis Mother   . Skin cancer Mother   . Alcohol abuse Paternal Grandfather   . Colon cancer Paternal Grandfather   . Goiter Paternal Grandfather   . Heart disease Paternal Grandfather   . Alcohol abuse Paternal Grandmother   . Thyroid disease Paternal Grandmother   . Asthma Sister   . Depression Sister   . Obesity Sister   . Sleep apnea Sister   . Depression Sister     Review of Systems: A 12-system review of systems was performed and was negative except as noted in the HPI.  --------------------------------------------------------------------------------------------------  Physical Exam: BP 124/84 (BP Location: Left Arm, Patient Position: Sitting, Cuff Size: Large)   Pulse 92   Ht 5' 9"  (1.753 m)   Wt (!) 439 lb (199.1 kg)   SpO2 98%   BMI 64.83 kg/m   General: NAD. HEENT: No conjunctival pallor or scleral icterus. Facemask in place. Neck: Supple without lymphadenopathy, thyromegaly, JVD, or HJR, though evaluation is limited by body habitus. Lungs: Normal work of breathing. Clear to auscultation bilaterally without wheezes or crackles. Heart: Distant heart sounds. Regular rate and rhythm without murmurs, rubs, or gallops. Abd: Bowel sounds present. Soft, NT/ND. Unable to assess HSM due to body habitus. Ext: No lower extremity edema. Skin: Warm and dry without rash.  EKG: Normal sinus rhythm with first-degree AV block (PR interval 218 ms) and low voltage. Otherwise,  no significant abnormality.  Lab Results  Component Value Date   WBC 7.9 06/09/2019   HGB 12.8 06/09/2019   HCT 41.0 06/09/2019   MCV 86.9 06/09/2019   PLT 333 06/09/2019    Lab Results  Component Value Date   NA 137 06/09/2019   K 3.8 06/09/2019   CL 102 06/09/2019   CO2 25 06/09/2019   BUN 13 06/09/2019   CREATININE 0.66 06/09/2019   GLUCOSE 120 (H) 06/09/2019   ALT 76 (H) 08/24/2017    No results found for: CHOL, HDL, LDLCALC, LDLDIRECT, TRIG, CHOLHDL  --------------------------------------------------------------------------------------------------  ASSESSMENT AND PLAN: Sinus node dysfunction and syncope: Ms. Breit has not had any further syncopal episodes though event monitor after recent hospitalization showed multiple pauses of up to 7 seconds. Most occurred at night while she was asleep and were felt to be neurocardiogenic in the setting of sleep apnea. It is notable that she had a few  episodes during the daytime though she was asymptomatic. She was evaluated by Dr. Caryl Comes, who did not make further recommendations aside from treatment of sleep apnea. Sleep study is currently pending. Now that formal monitor report has been finalized, I will touch base with EP to determine if any further measures are indicated at this time.  Obstructive sleep apnea: Ms. Oboyle is scheduled for a repeat sleep study through [facility] later this month.  Morbid obesity: Ms. Vandenheuvel is trying to lose weight and recently started the keto diet. I have encouraged her to stick with this and to exercise, as tolerated.  Follow-up: Return to clinic in 2 months.  Nelva Bush, MD 07/06/2019 10:27 AM

## 2019-07-04 NOTE — Patient Instructions (Addendum)
Medication Instructions:  Your physician recommends that you continue on your current medications as directed. Please refer to the Current Medication list given to you today.  *If you need a refill on your cardiac medications before your next appointment, please call your pharmacy*  Lab Work: none If you have labs (blood work) drawn today and your tests are completely normal, you will receive your results only by: Marland Kitchen MyChart Message (if you have MyChart) OR . A paper copy in the mail If you have any lab test that is abnormal or we need to change your treatment, we will call you to review the results.  Testing/Procedures: none  Follow-Up: At [facility], you and your health needs are our priority.  As part of our continuing mission to provide you with exceptional heart care, we have created designated Provider Care Teams.  These Care Teams include your primary Cardiologist (physician) and Advanced Practice Providers (APPs -  Physician Assistants and Nurse Practitioners) who all work together to provide you with the care you need, when you need it.  Your next appointment:   2 months.  The format for your next appointment:   In Person  Provider:    You may see DR Harrell Gave END or one of the following Advanced Practice Providers on your designated Care Team:    Murray Hodgkins, NP  Christell Faith, PA-C  Marrianne Mood, PA-C

## 2019-07-06 ENCOUNTER — Encounter: Payer: Self-pay | Admitting: Internal Medicine

## 2019-07-06 DIAGNOSIS — I495 Sick sinus syndrome: Secondary | ICD-10-CM | POA: Insufficient documentation

## 2019-09-03 ENCOUNTER — Other Ambulatory Visit: Payer: Self-pay

## 2019-09-03 ENCOUNTER — Encounter: Payer: Self-pay | Admitting: Internal Medicine

## 2019-09-03 ENCOUNTER — Ambulatory Visit: Payer: BC Managed Care – PPO | Admitting: Internal Medicine

## 2019-09-03 VITALS — BP 132/92 | HR 87 | Ht 69.0 in | Wt >= 6400 oz

## 2019-09-03 DIAGNOSIS — R55 Syncope and collapse: Secondary | ICD-10-CM

## 2019-09-03 DIAGNOSIS — G4733 Obstructive sleep apnea (adult) (pediatric): Secondary | ICD-10-CM

## 2019-09-03 DIAGNOSIS — R030 Elevated blood-pressure reading, without diagnosis of hypertension: Secondary | ICD-10-CM | POA: Diagnosis not present

## 2019-09-03 NOTE — Progress Notes (Signed)
Follow-up Outpatient Visit Date: 09/03/2019  Primary Care Provider: Zeb Comfort, [address] [address]  Chief Complaint: Follow-up syncope  HPI:  Claudia Campbell is a 49 y.o. female with history of  syncope with sinus pauses of up to 7 seconds noted on event monitor (predominantly while asleep), morbid obesity, obstructive sleep apnea not on CPAP, hemiplegic migraine disorder, post ablative hypothyroidism, restless leg syndrome, Barrett's esophagus, nephrolithiasis, bilateral occipital neuralgia, osteoarthritis, anxiety, depression, and GERD, who presents for follow-up of syncope.  I last saw her in early November, which time she was feeling well without additional episodes of lightheadedness or syncope.  She was encouraged to move forward with sleep study, given concern that sleep apnea was driving much of her bradycardia.  She was also in the process of starting the keto diet and effort to lose weight.  Today, Ms. Diponio reports feeling well.  She has lost between 20 to 25 pounds since November and reports more energy.  She has been using CPAP for the last month and feels like her sleep is better, though she still has some difficulty finding a comfortable sleeping position.  She denies chest pain, shortness of breath, palpitations, lightheadedness, and syncope.  She notes mild lower extremity edema, which has actually improved since our last visit.  --------------------------------------------------------------------------------------------------  Past Medical History:  Diagnosis Date  . Abnormal liver function test   . Arthritis   . Asthma   . Barrett esophagus   . Depression   . First degree AV block   . GERD (gastroesophageal reflux disease)   . Hypothyroidism    Post ablation  . Migraines   . PONV (postoperative nausea and vomiting)   . Restless leg   . Sleep apnea    NO cpap at this time, broken  . Tenosynovitis   . Thyroid nodule   . Vitamin D deficiency     Past Surgical History:  Procedure Laterality Date  . BLADDER SUSPENSION  2011  . BREAST RECONSTRUCTION    . BUNIONECTOMY Bilateral   . CAPSULOTOMY Left 01/23/2018   Procedure: CAPSULOTOMY SECOND LEFT;  Surgeon: Edrick Kins, DPM;  Location: [facility];  Service: Podiatry;  Laterality: Left;  . CHOLECYSTECTOMY    . COLONOSCOPY    . ESOPHAGOGASTRODUODENOSCOPY (EGD) WITH PROPOFOL N/A 01/18/2018   Procedure: ESOPHAGOGASTRODUODENOSCOPY (EGD) WITH PROPOFOL;  Surgeon: Lollie Sails, MD;  Location: [facility] ENDOSCOPY;  Service: Endoscopy;  Laterality: N/A;  . EXCISION MORTON'S NEUROMA    . EXPLORATORY LAPAROTOMY    . HAMMER TOE SURGERY Left 01/23/2018   Procedure: HAMMER TOE CORRECTION SECOND LEFT;  Surgeon: Edrick Kins, DPM;  Location: [facility];  Service: Podiatry;  Laterality: Left;  . HYSTEROSCOPY W/ ENDOMETRIAL ABLATION    . KNEE ARTHROSCOPY Left   . MENISECTOMY     x2  . METATARSAL OSTEOTOMY Left 01/23/2018   Procedure: METATARSAL OSTEOTOMY SECOND LEFT;  Surgeon: Edrick Kins, DPM;  Location: [facility];  Service: Podiatry;  Laterality: Left;  . monitoring cranial nerves Bilateral   . OSTECTOMY CALCANEUS Left   . RAI ablation  2007  . TENOTOMY Left    toe  . TONSILLECTOMY AND ADENOIDECTOMY  10/1981  . topaz Left    heel  . TOTAL THYROIDECTOMY  10/12/2017  . TOTAL THYROIDECTOMY    . TUBAL LIGATION    . UPPER GI ENDOSCOPY      Current Meds  Medication Sig  . esomeprazole (NEXIUM) 40 MG capsule Take 40  mg by mouth 2 (two) times daily before a meal.   . montelukast (SINGULAIR) 10 MG tablet Take 10 mg by mouth at bedtime.   . pramipexole (MIRAPEX) 1 MG tablet Take 2 mg by mouth at bedtime.   . sertraline (ZOLOFT) 100 MG tablet Take 150 mg by mouth daily.   Marland Kitchen SYNTHROID 200 MCG tablet Take 200 mcg by mouth daily before breakfast.   . zonisamide (ZONEGRAN) 100 MG capsule Take 100 mg by mouth daily.    Allergies: Aspartame, Ciprofloxacin, Latex, and Dicyclomine  Social History    Tobacco Use  . Smoking status: Never Smoker  . Smokeless tobacco: Never Used  Substance Use Topics  . Alcohol use: No  . Drug use: No    Family History  Problem Relation Age of Onset  . Hypertension Father   . Hyperlipidemia Father   . Alcohol abuse Father   . Sleep apnea Father   . Colon polyps Father   . Obesity Father   . CAD Maternal Grandfather   . Alcohol abuse Maternal Grandfather   . Skin cancer Maternal Grandfather   . Pancreatic cancer Maternal Grandmother   . Thyroid disease Maternal Grandmother   . Rheum arthritis Mother   . Skin cancer Mother   . Alcohol abuse Paternal Grandfather   . Colon cancer Paternal Grandfather   . Goiter Paternal Grandfather   . Heart disease Paternal Grandfather   . Alcohol abuse Paternal Grandmother   . Thyroid disease Paternal Grandmother   . Asthma Sister   . Depression Sister   . Obesity Sister   . Sleep apnea Sister   . Heart disease Sister        spontaneous dissection of coronary artery  . Depression Sister     Review of Systems: A 12-system review of systems was performed and was negative except as noted in the HPI.  --------------------------------------------------------------------------------------------------  Physical Exam: BP (!) 132/92 (BP Location: Left Arm, Patient Position: Sitting, Cuff Size: Large)   Pulse 87   Ht 5\' 9"  (1.753 m)   Wt (!) 416 lb 12 oz (189 kg)   BMI 61.54 kg/m   General: NAD. HEENT: No conjunctival pallor or scleral icterus. Facemask in place. Neck: Supple without lymphadenopathy, thyromegaly, JVD, or HJR, though evaluation is limited by body habitus. Lungs: Normal work of breathing. Clear to auscultation bilaterally without wheezes or crackles. Heart: Regular rate and rhythm without murmurs, rubs, or gallops.  Unable to assess PMI due to body habitus. Abd: Bowel sounds present. Soft, NT/ND.  Unable to assess HSM due to body habitus. Ext: No lower extremity edema. Skin: Warm and  dry without rash.  EKG: Normal sinus rhythm with first-degree AV block (PR interval 210 ms).  No significant change from prior tracing on 07/04/2019.  Lab Results  Component Value Date   WBC 7.9 06/09/2019   HGB 12.8 06/09/2019   HCT 41.0 06/09/2019   MCV 86.9 06/09/2019   PLT 333 06/09/2019    Lab Results  Component Value Date   NA 137 06/09/2019   K 3.8 06/09/2019   CL 102 06/09/2019   CO2 25 06/09/2019   BUN 13 06/09/2019   CREATININE 0.66 06/09/2019   GLUCOSE 120 (H) 06/09/2019   ALT 76 (H) 08/24/2017    No results found for: CHOL, HDL, LDLCALC, LDLDIRECT, TRIG, CHOLHDL  --------------------------------------------------------------------------------------------------  ASSESSMENT AND PLAN: Syncope: No recurrent lightheadedness or syncope.  As previously noted by Dr. Caryl Comes, episode this summer was most likely vasovagal in nature.  Monitor did show pauses, typically at night, thought to be due to sinus node dysfunction driven by underlying sleep apnea.  Hopefully, this will resolve with continued treatment of sleep apnea and weight loss.  Elevated blood pressure reading: Blood pressure mildly elevated today at 132/92, though readings have typically been better at prior visits.  I encouraged lifestyle modifications, including sodium restriction and continued weight loss through diet and exercise.  No indication for pharmacotherapy at this time.  Obstructive sleep apnea: I encouraged Ms. Loeffelholz to continue with CPAP.  Morbid obesity: I congratulated Ms. Tiegs on her weight loss since November and encouraged her to continue to lose weight through diet and exercise.  Follow-up: Return to clinic in 6 months.  Nelva Bush, MD 09/04/2019 7:28 AM

## 2019-09-03 NOTE — Patient Instructions (Signed)
Medication Instructions:  Your physician recommends that you continue on your current medications as directed. Please refer to the Current Medication list given to you today.  *If you need a refill on your cardiac medications before your next appointment, please call your pharmacy*  Lab Work: none If you have labs (blood work) drawn today and your tests are completely normal, you will receive your results only by: . MyChart Message (if you have MyChart) OR . A paper copy in the mail If you have any lab test that is abnormal or we need to change your treatment, we will call you to review the results.  Testing/Procedures: none  Follow-Up: At [facility], you and your health needs are our priority.  As part of our continuing mission to provide you with exceptional heart care, we have created designated Provider Care Teams.  These Care Teams include your primary Cardiologist (physician) and Advanced Practice Providers (APPs -  Physician Assistants and Nurse Practitioners) who all work together to provide you with the care you need, when you need it.  Your next appointment:   6 months  The format for your next appointment:   In Person  Provider:    You may see DR CHRISTOPHER END or one of the following Advanced Practice Providers on your designated Care Team:    Christopher Berge, NP  Ryan Dunn, PA-C  Jacquelyn Visser, PA-C  

## 2019-09-04 ENCOUNTER — Encounter: Payer: Self-pay | Admitting: Internal Medicine

## 2019-09-04 DIAGNOSIS — R030 Elevated blood-pressure reading, without diagnosis of hypertension: Secondary | ICD-10-CM | POA: Insufficient documentation

## 2019-10-18 ENCOUNTER — Encounter: Payer: Self-pay | Admitting: Intensive Care

## 2019-10-18 ENCOUNTER — Emergency Department: Payer: BC Managed Care – PPO

## 2019-10-18 ENCOUNTER — Emergency Department
Admission: EM | Admit: 2019-10-18 | Discharge: 2019-10-19 | Disposition: A | Discharge status: Home / Self Care | Payer: BC Managed Care – PPO | Attending: Emergency Medicine | Admitting: Emergency Medicine

## 2019-10-18 ENCOUNTER — Other Ambulatory Visit: Payer: Self-pay

## 2019-10-18 DIAGNOSIS — E039 Hypothyroidism, unspecified: Secondary | ICD-10-CM | POA: Diagnosis not present

## 2019-10-18 DIAGNOSIS — Z79899 Other long term (current) drug therapy: Secondary | ICD-10-CM | POA: Diagnosis not present

## 2019-10-18 DIAGNOSIS — R1030 Lower abdominal pain, unspecified: Secondary | ICD-10-CM | POA: Diagnosis present

## 2019-10-18 DIAGNOSIS — N83202 Unspecified ovarian cyst, left side: Secondary | ICD-10-CM

## 2019-10-18 DIAGNOSIS — Z91040 Latex allergy status: Secondary | ICD-10-CM | POA: Diagnosis not present

## 2019-10-18 DIAGNOSIS — R102 Pelvic and perineal pain: Secondary | ICD-10-CM | POA: Insufficient documentation

## 2019-10-18 LAB — CBC
HCT: 44.7 % (ref 36.0–46.0)
Hemoglobin: 14.1 g/dL (ref 12.0–15.0)
MCH: 26.9 pg (ref 26.0–34.0)
MCHC: 31.5 g/dL (ref 30.0–36.0)
MCV: 85.1 fL (ref 80.0–100.0)
Platelets: 360 10*3/uL (ref 150–400)
RBC: 5.25 MIL/uL — ABNORMAL HIGH (ref 3.87–5.11)
RDW: 15.6 % — ABNORMAL HIGH (ref 11.5–15.5)
WBC: 11.4 10*3/uL — ABNORMAL HIGH (ref 4.0–10.5)
nRBC: 0 % (ref 0.0–0.2)

## 2019-10-18 LAB — COMPREHENSIVE METABOLIC PANEL
ALT: 59 U/L — ABNORMAL HIGH (ref 0–44)
AST: 38 U/L (ref 15–41)
Albumin: 4.3 g/dL (ref 3.5–5.0)
Alkaline Phosphatase: 68 U/L (ref 38–126)
Anion gap: 11 (ref 5–15)
BUN: 14 mg/dL (ref 6–20)
CO2: 24 mmol/L (ref 22–32)
Calcium: 9.5 mg/dL (ref 8.9–10.3)
Chloride: 102 mmol/L (ref 98–111)
Creatinine, Ser: 0.69 mg/dL (ref 0.44–1.00)
GFR calc Af Amer: >60 mL/min (ref >60)
GFR calc non Af Amer: >60 mL/min (ref >60)
Glucose, Bld: 87 mg/dL (ref 70–99)
Potassium: 3.5 mmol/L (ref 3.5–5.1)
Sodium: 137 mmol/L (ref 135–145)
Total Bilirubin: 0.9 mg/dL (ref 0.3–1.2)
Total Protein: 8.1 g/dL (ref 6.5–8.1)

## 2019-10-18 LAB — URINALYSIS, COMPLETE (UACMP) WITH MICROSCOPIC
Bilirubin Urine: NEGATIVE
Glucose, UA: NEGATIVE mg/dL
Hgb urine dipstick: NEGATIVE
Ketones, ur: 20 mg/dL — AB
Nitrite: NEGATIVE
Protein, ur: NEGATIVE mg/dL
Specific Gravity, Urine: 1.024 (ref 1.005–1.030)
pH: 5 (ref 5.0–8.0)

## 2019-10-18 LAB — POCT PREGNANCY, URINE: Preg Test, Ur: NEGATIVE

## 2019-10-18 LAB — LIPASE, BLOOD: Lipase: 25 U/L (ref 11–51)

## 2019-10-18 MED ORDER — MORPHINE SULFATE (PF) 4 MG/ML IV SOLN
4.0000 mg | Freq: Once | INTRAVENOUS | Status: AC
  Administered 2019-10-18: 4 mg via INTRAVENOUS
  Filled 2019-10-18: qty 1

## 2019-10-18 MED ORDER — IOHEXOL 350 MG/ML SOLN
125.0000 mL | Freq: Once | INTRAVENOUS | Status: AC | PRN
  Administered 2019-10-18: 125 mL via INTRAVENOUS

## 2019-10-18 MED ORDER — KETOROLAC TROMETHAMINE 30 MG/ML IJ SOLN
15.0000 mg | Freq: Once | INTRAMUSCULAR | Status: AC
  Administered 2019-10-18: 15 mg via INTRAVENOUS
  Filled 2019-10-18: qty 1

## 2019-10-18 MED ORDER — LACTATED RINGERS IV BOLUS
1000.0000 mL | Freq: Once | INTRAVENOUS | Status: AC
  Administered 2019-10-18: 1000 mL via INTRAVENOUS

## 2019-10-18 MED ORDER — ONDANSETRON HCL 4 MG/2ML IJ SOLN
4.0000 mg | Freq: Once | INTRAMUSCULAR | Status: AC
  Administered 2019-10-18: 21:00:00 4 mg via INTRAVENOUS
  Filled 2019-10-18: qty 2

## 2019-10-18 NOTE — ED Provider Notes (Signed)
[facility] Emergency Department Provider Note   ____________________________________________   First MD Initiated Contact with Patient 10/18/19 2008     (approximate)  I have reviewed the triage vital signs and the nursing notes.   HISTORY  Chief Complaint Abdominal Pain and Nausea    HPI Claudia Campbell is a 49 y.o. female with past medical history of Barrett esophagus, hypothyroidism, migraines, OSA, and depression who presents to the ED complaining of abdominal pain.  Patient reports that she has had gradually worsening pain affecting her abdomen since 11:00 yesterday morning.  She describes the pain as sharp and constant, becomes more severe whenever she goes to eat or drink anything.  Pain primarily affects both sides of her lower abdomen, but she states it has been moving upwards in her abdomen as it has become more severe.  She has felt nauseous, especially when attempting to eat, but has not had any vomiting and denies diarrhea.  She denies any fevers, dysuria, hematuria, or flank pain but does describe some suprapubic pressure and has previously had a bladder sling placed.        Past Medical History:  Diagnosis Date  . Abnormal liver function test   . Arthritis   . Asthma   . Barrett esophagus   . Depression   . First degree AV block   . GERD (gastroesophageal reflux disease)   . Hypothyroidism    Post ablation  . Migraines   . PONV (postoperative nausea and vomiting)   . Restless leg   . Sleep apnea    NO cpap at this time, broken  . Tenosynovitis   . Thyroid nodule   . Vitamin D deficiency     Patient Active Problem List   Diagnosis Date Noted  . Elevated blood pressure reading 09/04/2019  . Sinus node dysfunction ([place]) 07/06/2019  . Morbid obesity ([place]) 07/06/2019  . Syncope 06/10/2019  . Anxiety, generalized 05/23/2017  . Hyperplastic colonic polyp 05/23/2017  . Kidney stones 05/23/2017  . Multiple thyroid nodules 05/23/2017   . Obstructive sleep apnea 05/23/2017  . PCOS (polycystic ovarian syndrome) 05/23/2017  . Barrett's esophagus 05/22/2017  . GERD (gastroesophageal reflux disease) 05/22/2017  . 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 Major depression, chronic 05/22/2017  . 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  . Restless leg syndrome 05/22/2017  . Vitamin D deficiency, unspecified 05/22/2017  . Left wrist tendinitis 04/20/2017  . Primary osteoarthritis of both knees 04/20/2017  . Ulnar neuropathy at elbow of left upper extremity 04/20/2017    Past Surgical History:  Procedure Laterality Date  . BLADDER SUSPENSION  2011  . BREAST RECONSTRUCTION    . BUNIONECTOMY Bilateral   . CAPSULOTOMY Left 01/23/2018   Procedure: CAPSULOTOMY SECOND LEFT;  Surgeon: Edrick Kins, DPM;  Location: [facility];  Service: Podiatry;  Laterality: Left;  . CHOLECYSTECTOMY    . COLONOSCOPY    . ESOPHAGOGASTRODUODENOSCOPY (EGD) WITH PROPOFOL N/A 01/18/2018   Procedure: ESOPHAGOGASTRODUODENOSCOPY (EGD) WITH PROPOFOL;  Surgeon: Lollie Sails, MD;  Location: [facility] ENDOSCOPY;  Service: Endoscopy;  Laterality: N/A;  . EXCISION MORTON'S NEUROMA    . EXPLORATORY LAPAROTOMY    . HAMMER TOE SURGERY Left 01/23/2018   Procedure: HAMMER TOE CORRECTION SECOND LEFT;  Surgeon: Edrick Kins, DPM;  Location: [facility];  Service: Podiatry;  Laterality: Left;  . HYSTEROSCOPY W/ ENDOMETRIAL ABLATION    . KNEE ARTHROSCOPY Left   . MENISECTOMY     x2  . METATARSAL OSTEOTOMY Left 01/23/2018   Procedure:  METATARSAL OSTEOTOMY SECOND LEFT;  Surgeon: Edrick Kins, DPM;  Location: [facility];  Service: Podiatry;  Laterality: Left;  . monitoring cranial nerves Bilateral   . OSTECTOMY CALCANEUS Left   . RAI ablation  2007  . TENOTOMY Left    toe  . TONSILLECTOMY AND ADENOIDECTOMY  10/1981  . topaz Left    heel  . TOTAL THYROIDECTOMY  10/12/2017  . TOTAL THYROIDECTOMY    . TUBAL LIGATION    . UPPER GI ENDOSCOPY      Prior to Admission medications     Medication Sig Start Date End Date Taking? Authorizing Provider  esomeprazole (NEXIUM) 40 MG capsule Take 40 mg by mouth 2 (two) times daily before a meal.  01/04/18   [provider]  montelukast (SINGULAIR) 10 MG tablet Take 10 mg by mouth at bedtime.  01/04/18   [provider]  pramipexole (MIRAPEX) 1 MG tablet Take 2 mg by mouth at bedtime.  01/04/18   [provider]  sertraline (ZOLOFT) 100 MG tablet Take 150 mg by mouth daily.  04/27/19   [provider]  SYNTHROID 200 MCG tablet Take 200 mcg by mouth daily before breakfast.  04/27/19   [provider]  zonisamide (ZONEGRAN) 100 MG capsule Take 100 mg by mouth daily.    [provider]    Allergies Aspartame, Ciprofloxacin, Latex, and Dicyclomine  Family History  Problem Relation Age of Onset  . Hypertension Father   . Hyperlipidemia Father   . Alcohol abuse Father   . Sleep apnea Father   . Colon polyps Father   . Obesity Father   . CAD Maternal Grandfather   . Alcohol abuse Maternal Grandfather   . Skin cancer Maternal Grandfather   . Pancreatic cancer Maternal Grandmother   . Thyroid disease Maternal Grandmother   . Rheum arthritis Mother   . Skin cancer Mother   . Alcohol abuse Paternal Grandfather   . Colon cancer Paternal Grandfather   . Goiter Paternal Grandfather   . Heart disease Paternal Grandfather   . Alcohol abuse Paternal Grandmother   . Thyroid disease Paternal Grandmother   . Asthma Sister   . Depression Sister   . Obesity Sister   . Sleep apnea Sister   . Heart disease Sister        spontaneous dissection of coronary artery  . Depression Sister     Social History Social History   Tobacco Use  . Smoking status: Never Smoker  . Smokeless tobacco: Never Used  Substance Use Topics  . Alcohol use: No  . Drug use: No    Review of Systems  Constitutional: No fever/chills Eyes: No visual changes. ENT: No sore throat. Cardiovascular: Denies  chest pain. Respiratory: Denies shortness of breath. Gastrointestinal: Positive for abdominal pain.  Positive for nausea, no vomiting.  No diarrhea.  No constipation. Genitourinary: Negative for dysuria. Musculoskeletal: Negative for back pain. Skin: Negative for rash. Neurological: Negative for headaches, focal weakness or numbness.  ____________________________________________   PHYSICAL EXAM:  VITAL SIGNS: ED Triage Vitals [10/18/19 1810]  Enc Vitals Group     BP (!) 147/94     Pulse Rate 97     Resp 18     Temp (!) 97.4 F (36.3 C)     Temp Source Oral     SpO2 96 %     Weight (!) 408 lb (185.1 kg)     Height 5\' 9"  (1.753 m)     Head Circumference  Peak Flow      Pain Score 10     Pain Loc      Pain Edu?      Excl. in [place]?     Constitutional: Alert and oriented. Eyes: Conjunctivae are normal. Head: Atraumatic. Nose: No congestion/rhinnorhea. Mouth/Throat: Mucous membranes are moist. Neck: Normal ROM Cardiovascular: Normal rate, regular rhythm. Grossly normal heart sounds. Respiratory: Normal respiratory effort.  No retractions. Lungs CTAB. Gastrointestinal: Soft and diffusely tender to palpation, bilateral lower quadrants greater than bilateral upper quadrants. No distention. Genitourinary: deferred Musculoskeletal: No lower extremity tenderness nor edema. Neurologic:  Normal speech and language. No gross focal neurologic deficits are appreciated. Skin:  Skin is warm, dry and intact. No rash noted. Psychiatric: Mood and affect are normal. Speech and behavior are normal.  ____________________________________________   LABS (all labs ordered are listed, but only abnormal results are displayed)  Labs Reviewed  COMPREHENSIVE METABOLIC PANEL - Abnormal; Notable for the following components:      Result Value   ALT 59 (*)    All other components within normal limits  CBC - Abnormal; Notable for the following components:   WBC 11.4 (*)    RBC 5.25 (*)     RDW 15.6 (*)    All other components within normal limits  URINALYSIS, COMPLETE (UACMP) WITH MICROSCOPIC - Abnormal; Notable for the following components:   Color, Urine YELLOW (*)    APPearance CLOUDY (*)    Ketones, ur 20 (*)    Leukocytes,Ua SMALL (*)    Bacteria, UA RARE (*)    All other components within normal limits  URINE CULTURE  LIPASE, BLOOD  POC URINE PREG, ED  POCT PREGNANCY, URINE     PROCEDURES  Procedure(s) performed (including Critical Care):  Procedures   ____________________________________________   INITIAL IMPRESSION / ASSESSMENT AND PLAN / ED COURSE       49 year old female presents to the ED complaining of approximately 24 hours of worsening pain to her bilateral lower quadrants, now extending into her upper abdomen.  Initial lab work is reassuring, UA consistent with a dirty sample but given her lack of specific urinary symptoms we will hold off on treatment for UTI.  CT scan shows left adnexal lesion representing fibroid versus lesion related to ovary, will further assess with ultrasound.  Patient states she was scheduled for a partial hysterectomy with her OB/GYN in [place] at her request due to ongoing issues with severe cramping.  She adamantly denies any history of fibroids and there is no recent pelvic imaging in our system.  If ultrasound shows fibroid, then she would be appropriate for follow-up with her OB/GYN, but if there is other concerning lesion then OB/GYN consultation may be indicated.  Patient turned over to oncoming provider pending ultrasound results.      ____________________________________________   FINAL CLINICAL IMPRESSION(S) / ED DIAGNOSES  Final diagnoses:  Pelvic pain  Lower abdominal pain     ED Discharge Orders    None       Note:  This document was prepared using Dragon voice recognition software and may include unintentional dictation errors.   Blake Divine, MD 10/18/19 2308

## 2019-10-18 NOTE — ED Triage Notes (Signed)
Patient c/o abdominal pain with nausea since yesterday evening. Patient reports having bladder sling. Reports no burning during urination but takes longer to start urinating

## 2019-10-18 NOTE — ED Provider Notes (Signed)
----------------------------------------- 11:57 PM on 10/18/2019 -----------------------------------------   Blood pressure 139/89, pulse 77, temperature (!) 97.4 F (36.3 C), temperature source Oral, resp. rate 18, height 5\' 9"  (1.753 m), weight (!) 185.1 kg, SpO2 99 %.  Assuming care from Dr. Charna Archer of Dalecia Salzwedel is a 49 y.o. female with a chief complaint of Abdominal Pain and Nausea .    Please refer to H&P by previous MD for further details.  The current plan of care is to f/u pelvic US to better evaluate left adnexal mass seen on CT.   [place] consistent with a 3.2cm simple ovarian cyst with no bleeding and no torsion. Pain well controlled. Will dc with f/u with her OB. Discussed my standard return precautions.      I have personally reviewed the images performed during this visit and I agree with the Radiologist's read.   Interpretation by Radiologist:  CT Abdomen Pelvis W Contrast  Result Date: 10/18/2019 CLINICAL DATA:  Diffuse abdominal pain. Nausea. EXAM: CT ABDOMEN AND PELVIS WITH CONTRAST TECHNIQUE: Multidetector CT imaging of the abdomen and pelvis was performed using the standard protocol following bolus administration of intravenous contrast. CONTRAST:  136mL OMNIPAQUE IOHEXOL 350 MG/ML SOLN COMPARISON:  None. FINDINGS: Lower chest: Lower most lung bases are clear. Upper normal heart size. Hepatobiliary: The liver is enlarged spanning 22 cm cranial caudal with hepatic steatosis. Questionable 11 mm hyperdense lesion in the left lobe versus focal fatty sparing. Clips in the gallbladder fossa postcholecystectomy. No biliary dilatation. Pancreas: No ductal dilatation or inflammation. Spleen: Upper normal in size spanning 13 cm AP. No focal abnormality. Adrenals/Urinary Tract: Normal adrenal glands. No hydronephrosis. Homogeneous renal enhancement with symmetric excretion on delayed phase imaging. Tiny hypodensity in the upper right kidney is too small to characterize, likely cyst.  Mild bilateral symmetric perinephric edema. Urinary bladder is nondistended and not well evaluated. Stomach/Bowel: Tiny hiatal hernia. Nondistended stomach. No bowel wall thickening or inflammation. Appendix tentatively visualized and normal, series 10, image 70. Small volume of stool throughout the colon. Vascular/Lymphatic: Abdominal aorta is normal in caliber. The portal vein is patent. No enlarged lymph nodes in the abdomen or pelvis. Reproductive: Heterogeneous lesion in the left adnexa measuring 7.4 x 6.0 x 6.7 cm, difficult to delineate an exophytic uterine fibroid from ovarian/adnexal lesion. Bilateral tubal occlusion devices in place. Other: Possible small volume free fluid in the pelvis. No upper abdominal ascites. No free air. Soft tissue attenuation from habitus and streak artifact from left lower abdomen abutting the CT gantry partially limit evaluation. Musculoskeletal: There are no acute or suspicious osseous abnormalities. Degenerative change in the spine with primarily facet hypertrophy. IMPRESSION: 1. Heterogeneous lesion in the left adnexa measuring 7.4 x 6.0 x 6.7 cm. This may represent an exophytic uterine fibroid from ovarian/adnexal lesion. Recommend pelvic ultrasound for characterization. 2. Hepatomegaly and hepatic steatosis. Questionable 11 mm hyperdense lesion in the left lobe of the liver versus focal fatty sparing. 3. Mild nonspecific bilateral perinephric edema. Electronically Signed   By: Keith Rake M.D.   On: 10/18/2019 21:49   US PELVIC COMPLETE W TRANSVAGINAL AND TORSION R/O  Result Date: 10/18/2019 CLINICAL DATA:  Pelvic pain with left adnexal lesion on CT. EXAM: TRANSABDOMINAL AND TRANSVAGINAL ULTRASOUND OF PELVIS DOPPLER ULTRASOUND OF OVARIES TECHNIQUE: Both transabdominal and transvaginal ultrasound examinations of the pelvis were performed. Transabdominal technique was performed for global imaging of the pelvis including uterus, ovaries, adnexal regions, and pelvic  cul-de-sac. It was necessary to proceed with endovaginal exam following the transabdominal  exam to visualize the uterus, ovaries, and endometrium. Color and duplex Doppler ultrasound was utilized to evaluate blood flow to the ovaries. COMPARISON:  Abdominal CT earlier this day FINDINGS: Uterus Measurements: 10.9 x 5.3 x 6.2 cm = volume: 189 mL. Uterus is anteverted. No fibroids or other mass visualized. Endometrium Thickness: 8 mm.  Trace fluid in the endometrial canal. Right ovary Measurements: 3.2 x 1.8 x 2.3 cm = volume: 6.7 mL. Normal appearance. Normal blood flow is noted. No adnexal mass. Left ovary Measurements: 6.6 x 4.6 x 6.2 cm = volume: 99 mL. There is a 3.2 x 2.8 x 3.2 cm simple cyst. Normal blood flow is noted. Pulsed Doppler evaluation of both ovaries demonstrates normal low-resistance arterial and venous waveforms. Other findings Moderate volume of free fluid in the pelvis. IMPRESSION: 1. Left adnexal lesion on CT represents a 3.2 cm simple cyst in the left ovary. No dedicated imaging follow-up is needed. There is normal blood flow to the ovary without torsion. 2. No uterine fibroid.  Uterus and right ovary are unremarkable. 3. Moderate amount of free fluid in the pelvis. Electronically Signed   By: Keith Rake M.D.   On: 10/18/2019 23:52           Rudene Re, MD 10/18/19 [phone]

## 2019-10-18 NOTE — ED Notes (Signed)
Pt taken to CT.

## 2019-10-18 NOTE — ED Notes (Signed)
First Nurse Note: Pt to ED via POV c/o abd pain and nausea since last night. Pt is in NAD.

## 2019-10-20 LAB — URINE CULTURE: Culture: >=100000 — AB

## 2020-01-06 ENCOUNTER — Ambulatory Visit: Payer: BC Managed Care – PPO | Admitting: Podiatry

## 2020-01-06 ENCOUNTER — Other Ambulatory Visit: Payer: Self-pay

## 2020-01-06 ENCOUNTER — Ambulatory Visit (INDEPENDENT_AMBULATORY_CARE_PROVIDER_SITE_OTHER): Payer: BC Managed Care – PPO

## 2020-01-06 DIAGNOSIS — M659 Synovitis and tenosynovitis, unspecified: Secondary | ICD-10-CM

## 2020-01-08 NOTE — Progress Notes (Signed)
   Subjective:  49 y.o. female presenting today with a chief complaint of pain to the posterior left ankle that began 3-4 weeks ago. She reports associated swelling of the area but attributes it to knee problems. Walking and standing increases the pain. She has been icing and elevating the ankle for treatment. Patient is here for further evaluation and treatment.   Past Medical History:  Diagnosis Date  . Abnormal liver function test   . Arthritis   . Asthma   . Barrett esophagus   . Depression   . First degree AV block   . GERD (gastroesophageal reflux disease)   . Hypothyroidism    Post ablation  . Migraines   . PONV (postoperative nausea and vomiting)   . Restless leg   . Sleep apnea    NO cpap at this time, broken  . Tenosynovitis   . Thyroid nodule   . Vitamin D deficiency      Objective / Physical Exam:  General:  The patient is alert and oriented x3 in no acute distress. Dermatology:  Skin is warm, dry and supple bilateral lower extremities. Negative for open lesions or macerations. Vascular:  Palpable pedal pulses bilaterally. No edema or erythema noted. Capillary refill within normal limits. Neurological:  Epicritic and protective threshold grossly intact bilaterally.  Musculoskeletal Exam:  Pain on palpation to the anterior lateral medial aspects of the patient's left ankle. Mild edema noted. Range of motion within normal limits to all pedal and ankle joints bilateral. Muscle strength 5/5 in all groups bilateral.   Radiographic Exam:  Normal osseous mineralization. Joint spaces preserved. No fracture/dislocation/boney destruction.    Assessment: 1. Left ankle synovitis   Plan of Care:  1. Patient was evaluated. X-Rays reviewed.  2. Injection of 0.5 mL Celestone Soluspan injected in the patient's left ankle. 3. Recommended good shoe gear.  4. Return to clinic as needed.    Edrick Kins, DPM [facility]  Dr. Edrick Kins, [address]                                        [place], [place] [address]                Office [phone]  Fax [phone]

## 2020-03-12 ENCOUNTER — Ambulatory Visit: Payer: BC Managed Care – PPO | Admitting: Internal Medicine

## 2020-04-20 ENCOUNTER — Other Ambulatory Visit: Payer: Self-pay

## 2020-04-20 ENCOUNTER — Ambulatory Visit: Payer: BC Managed Care – PPO | Admitting: Podiatry

## 2020-04-20 DIAGNOSIS — L409 Psoriasis, unspecified: Secondary | ICD-10-CM

## 2020-04-20 DIAGNOSIS — L603 Nail dystrophy: Secondary | ICD-10-CM | POA: Diagnosis not present

## 2020-04-20 NOTE — Progress Notes (Signed)
HPI: 49 y.o. female presenting today for evaluation of regrowth of the toenail plates to the digits 1-4 of the left foot. 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 She presents today because she would like them totally permanently removed again. She states that they're very sensitive and problematic with trimming. She presents today for further treatment and evaluation  Past Medical History:  Diagnosis Date  . Abnormal liver function test   . Arthritis   . Asthma   . Barrett esophagus   . Depression   . First degree AV block   . GERD (gastroesophageal reflux disease)   . Hypothyroidism    Post ablation  . Migraines   . PONV (postoperative nausea and vomiting)   . Restless leg   . Sleep apnea    NO cpap at this time, broken  . Tenosynovitis   . Thyroid nodule   . Vitamin D deficiency      Physical Exam: General: The patient is alert and oriented x3 in no acute distress.  Dermatology: Skin is warm, dry and supple bilateral lower extremities. Negative for open lesions or macerations. There are multiple nail spicules noted throughout the digits of the 1-4 left foot. There are portions of nail bed that are completely alleviated of nail plate however the nail spicules are what is problematic. There is some associated tenderness to palpation as well.  Vascular: Palpable pedal pulses bilaterally. No edema or erythema noted. Capillary refill within normal limits.  Neurological: Epicritic and protective threshold grossly intact bilaterally.   Musculoskeletal Exam: Range of motion within normal limits to all pedal and ankle joints bilateral. Muscle strength 5/5 in all groups bilateral.    Assessment: 1. Recurrent growth of nail plate/nail spicules 1-4 left foot   Plan of Care:  1. Patient evaluated. 2. Discussed treatment alternatives and plan of care. Explained nail avulsion  procedure and post procedure course to patient. 3. Patient opted for permanent total nail avulsions of the digits 1-4 of the left foot.  4. Prior to procedure, local anesthesia infiltration utilized using 3 ml of a 50:50 mixture of 2% plain lidocaine and 0.5% plain marcaine in a normal hallux block fashion and a betadine prep performed.  5. Total permanent nail avulsions with chemical matrixectomy performed using [id number] applications of phenol followed by alcohol flush.  6. Light dressing applied. Patient has antibiotic cream at home. Recommend antibiotic cream 2 times daily with Epsom salt soaks 7. Return to clinic 2 weeks.  Edrick Kins, DPM [facility]  Dr. Edrick Kins, DPM    [address]                                        [place], [place] [address]                Office [phone]  Fax [phone]         Edrick Kins, DPM [facility]  Dr. Edrick Kins, DPM    [address]  [place], [place] [address]                Office [phone]  Fax [phone]

## 2020-10-19 ENCOUNTER — Emergency Department: Payer: BC Managed Care – PPO

## 2020-10-19 ENCOUNTER — Other Ambulatory Visit: Payer: Self-pay

## 2020-10-19 ENCOUNTER — Encounter: Payer: Self-pay | Admitting: Emergency Medicine

## 2020-10-19 ENCOUNTER — Emergency Department
Admission: EM | Admit: 2020-10-19 | Discharge: 2020-10-19 | Disposition: A | Discharge status: Home / Self Care | Payer: BC Managed Care – PPO | Attending: Emergency Medicine | Admitting: Emergency Medicine

## 2020-10-19 DIAGNOSIS — E039 Hypothyroidism, unspecified: Secondary | ICD-10-CM | POA: Diagnosis not present

## 2020-10-19 DIAGNOSIS — Z91040 Latex allergy status: Secondary | ICD-10-CM | POA: Insufficient documentation

## 2020-10-19 DIAGNOSIS — J45909 Unspecified asthma, uncomplicated: Secondary | ICD-10-CM | POA: Diagnosis not present

## 2020-10-19 DIAGNOSIS — R42 Dizziness and giddiness: Secondary | ICD-10-CM

## 2020-10-19 DIAGNOSIS — G43809 Other migraine, not intractable, without status migrainosus: Secondary | ICD-10-CM | POA: Insufficient documentation

## 2020-10-19 DIAGNOSIS — G43009 Migraine without aura, not intractable, without status migrainosus: Secondary | ICD-10-CM

## 2020-10-19 DIAGNOSIS — Z79899 Other long term (current) drug therapy: Secondary | ICD-10-CM | POA: Diagnosis not present

## 2020-10-19 LAB — BASIC METABOLIC PANEL
Anion gap: 9 (ref 5–15)
BUN: 16 mg/dL (ref 6–20)
CO2: 25 mmol/L (ref 22–32)
Calcium: 9.6 mg/dL (ref 8.9–10.3)
Chloride: 102 mmol/L (ref 98–111)
Creatinine, Ser: 0.62 mg/dL (ref 0.44–1.00)
GFR, Estimated: >60 mL/min (ref >60)
Glucose, Bld: 108 mg/dL — ABNORMAL HIGH (ref 70–99)
Potassium: 3.8 mmol/L (ref 3.5–5.1)
Sodium: 136 mmol/L (ref 135–145)

## 2020-10-19 LAB — URINALYSIS, COMPLETE (UACMP) WITH MICROSCOPIC
Bilirubin Urine: NEGATIVE
Glucose, UA: NEGATIVE mg/dL
Hgb urine dipstick: NEGATIVE
Ketones, ur: 5 mg/dL — AB
Leukocytes,Ua: NEGATIVE
Nitrite: NEGATIVE
Protein, ur: NEGATIVE mg/dL
Specific Gravity, Urine: 1.011 (ref 1.005–1.030)
pH: 7 (ref 5.0–8.0)

## 2020-10-19 LAB — CBC
HCT: 43.3 % (ref 36.0–46.0)
Hemoglobin: 14 g/dL (ref 12.0–15.0)
MCH: 28.2 pg (ref 26.0–34.0)
MCHC: 32.3 g/dL (ref 30.0–36.0)
MCV: 87.1 fL (ref 80.0–100.0)
Platelets: 332 10*3/uL (ref 150–400)
RBC: 4.97 MIL/uL (ref 3.87–5.11)
RDW: 13.9 % (ref 11.5–15.5)
WBC: 10.2 10*3/uL (ref 4.0–10.5)
nRBC: 0 % (ref 0.0–0.2)

## 2020-10-19 LAB — POC URINE PREG, ED: Preg Test, Ur: NEGATIVE

## 2020-10-19 MED ORDER — PROCHLORPERAZINE EDISYLATE 10 MG/2ML IJ SOLN
10.0000 mg | Freq: Once | INTRAMUSCULAR | Status: AC
  Administered 2020-10-19: 10 mg via INTRAVENOUS
  Filled 2020-10-19: qty 2

## 2020-10-19 MED ORDER — DIAZEPAM 5 MG PO TABS: 5.0000 mg | ORAL_TABLET | Freq: Two times a day (BID) | ORAL | 0 refills | Status: AC | PRN

## 2020-10-19 MED ORDER — LORAZEPAM 2 MG/ML IJ SOLN
INTRAMUSCULAR | Status: AC
  Administered 2020-10-19: 1 mg via INTRAVENOUS
  Filled 2020-10-19: qty 1

## 2020-10-19 MED ORDER — DIPHENHYDRAMINE HCL 50 MG/ML IJ SOLN
25.0000 mg | Freq: Once | INTRAMUSCULAR | Status: AC
  Administered 2020-10-19: 25 mg via INTRAVENOUS
  Filled 2020-10-19: qty 1

## 2020-10-19 MED ORDER — SODIUM CHLORIDE 0.9 % IV BOLUS
1000.0000 mL | Freq: Once | INTRAVENOUS | Status: AC
  Administered 2020-10-19: 1000 mL via INTRAVENOUS

## 2020-10-19 MED ORDER — LORAZEPAM 2 MG/ML IJ SOLN: 1.0000 mg | Freq: Once | INTRAMUSCULAR | Status: AC

## 2020-10-19 MED ORDER — MECLIZINE HCL 25 MG PO TABS: 25.0000 mg | ORAL_TABLET | Freq: Three times a day (TID) | ORAL | 0 refills | Status: AC | PRN

## 2020-10-19 NOTE — ED Triage Notes (Signed)
Pt comes via EMS from home with c/o dizziness that started today. Pt denies any pain.

## 2020-10-19 NOTE — ED Triage Notes (Signed)
Pt in via EMS with dizziness onset at 11am. 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 Began having dizziness, and reports that the room is spinning. Worse when sitting. Denies any LOC, did eat breakfast this am. VSS in triage. Hx of same, but never this bad per pt

## 2020-10-19 NOTE — ED Provider Notes (Signed)
[facility] Emergency Department Provider Note  ____________________________________________   Event Date/Time   First MD Initiated Contact with Patient 10/19/20 1400     (approximate)  I have reviewed the triage vital signs and the nursing notes.   HISTORY  Chief Complaint Dizziness    HPI Claudia Campbell is a 50 y.o. female with past medical history as below here with dizziness.  The patient has a fairly complex history including hemiplegic migraines and multiple prior neurological work-ups.  She reports that she was at work today at home.  She reports she felt normal this morning. While at work in home this AM, she began to experience acute onset of dizziness and sensation like the room was spinning.  She tried to get up and go to her room and had significant difficulty due to feeling like she was off balance.  She had associated mild headache, that this is largely resolved.  Her dizziness has persisted, however.  Denies any difficulty speaking or swallowing.  She has a history of hemiplegic and complex migraine.  No other acute complaints.  No fevers or chills. No recent sinus infection. No aural fullness or tinnitus. No new medications. No focal weakness, numbness. Her hA has resolved. Her usual hemiplegic migraines/occipital migraines are typically associated w/ a more severe HA.       Past Medical History:  Diagnosis Date  . Abnormal liver function test   . Arthritis   . Asthma   . Barrett esophagus   . Depression   . First degree AV block   . GERD (gastroesophageal reflux disease)   . Hypothyroidism    Post ablation  . Migraines   . PONV (postoperative nausea and vomiting)   . Restless leg   . Sleep apnea    NO cpap at this time, broken  . Tenosynovitis   . Thyroid nodule   . Vitamin D deficiency     Patient Active Problem List   Diagnosis Date Noted  . Elevated blood pressure reading 09/04/2019  . Sinus node dysfunction ([place]) 07/06/2019   . Morbid obesity ([place]) 07/06/2019  . Syncope 06/10/2019  . Anxiety, generalized 05/23/2017  . Hyperplastic colonic polyp 05/23/2017  . Kidney stones 05/23/2017  . Multiple thyroid nodules 05/23/2017  . Obstructive sleep apnea 05/23/2017  . PCOS (polycystic ovarian syndrome) 05/23/2017  . Barrett's esophagus 05/22/2017  . GERD (gastroesophageal reflux disease) 05/22/2017  . 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 Major depression, chronic 05/22/2017  . 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  . Restless leg syndrome 05/22/2017  . Vitamin D deficiency, unspecified 05/22/2017  . Left wrist tendinitis 04/20/2017  . Primary osteoarthritis of both knees 04/20/2017  . Ulnar neuropathy at elbow of left upper extremity 04/20/2017    Past Surgical History:  Procedure Laterality Date  . BLADDER SUSPENSION  2011  . BREAST RECONSTRUCTION    . BUNIONECTOMY Bilateral   . CAPSULOTOMY Left 01/23/2018   Procedure: CAPSULOTOMY SECOND LEFT;  Surgeon: Edrick Kins, DPM;  Location: [facility] ORS;  Service: Podiatry;  Laterality: Left;  . CHOLECYSTECTOMY    . COLONOSCOPY    . ESOPHAGOGASTRODUODENOSCOPY (EGD) WITH PROPOFOL N/A 01/18/2018   Procedure: ESOPHAGOGASTRODUODENOSCOPY (EGD) WITH PROPOFOL;  Surgeon: Lollie Sails, MD;  Location: [facility] ENDOSCOPY;  Service: Endoscopy;  Laterality: N/A;  . EXCISION MORTON'S NEUROMA    . EXPLORATORY LAPAROTOMY    . HAMMER TOE SURGERY Left 01/23/2018   Procedure: HAMMER TOE CORRECTION SECOND LEFT;  Surgeon: Edrick Kins, DPM;  Location: [facility] ORS;  Service: Podiatry;  Laterality: Left;  . HYSTEROSCOPY W/ ENDOMETRIAL ABLATION    . KNEE ARTHROSCOPY Left   . MENISECTOMY     x2  . METATARSAL OSTEOTOMY Left 01/23/2018   Procedure: METATARSAL OSTEOTOMY SECOND LEFT;  Surgeon: Edrick Kins, DPM;  Location: [facility] ORS;  Service: Podiatry;  Laterality: Left;  . monitoring cranial nerves Bilateral   . OSTECTOMY CALCANEUS Left   . RAI ablation  2007  . TENOTOMY Left    toe  .  TONSILLECTOMY AND ADENOIDECTOMY  10/1981  . topaz Left    heel  . TOTAL THYROIDECTOMY  10/12/2017  . TOTAL THYROIDECTOMY    . TUBAL LIGATION    . UPPER GI ENDOSCOPY      Prior to Admission medications   Medication Sig Start Date End Date Taking? Authorizing Provider  diazepam (VALIUM) 5 MG tablet Take 1 tablet (5 mg total) by mouth every 12 (twelve) hours as needed (severe vertigo). 10/19/20 10/19/21 Yes Duffy Bruce, MD  meclizine (ANTIVERT) 25 MG tablet Take 1 tablet (25 mg total) by mouth 3 (three) times daily as needed for dizziness. 10/19/20  Yes Duffy Bruce, MD  augmented betamethasone dipropionate (DIPROLENE-AF) 0.05 % cream APPLY THIN FILM TO HANDS AND FEET SITES TWICE DAILY UNTIL CLEAR 11/05/19   [provider]  esomeprazole (NEXIUM) 40 MG capsule Take 40 mg by mouth 2 (two) times daily before a meal.  01/04/18   [provider]  fluconazole (DIFLUCAN) 150 MG tablet TAKE 1 TABLET BY MOUTH TODAY AND 1 TABLET BY MOUTH IN 3 DAYS 12/02/19   [provider]  ibuprofen (ADVIL) 800 MG tablet  11/19/19   [provider]  montelukast (SINGULAIR) 10 MG tablet Take 10 mg by mouth at bedtime.  01/04/18   [provider]  pramipexole (MIRAPEX) 1 MG tablet Take 2 mg by mouth at bedtime.  01/04/18   [provider]  sertraline (ZOLOFT) 100 MG tablet Take 150 mg by mouth daily.  04/27/19   [provider]  SYNTHROID 200 MCG tablet Take 200 mcg by mouth daily before breakfast.  04/27/19   [provider]  traMADol (ULTRAM) 50 MG tablet Take 50 mg by mouth every 6 (six) hours. 11/03/19   [provider]  zonisamide (ZONEGRAN) 100 MG capsule Take 100 mg by mouth daily.    [provider]    Allergies Aspartame, Ciprofloxacin, Latex, and Dicyclomine  Family History  Problem Relation Age of Onset  . Hypertension Father   . Hyperlipidemia Father   . Alcohol abuse Father   . Sleep apnea Father   . Colon polyps  Father   . Obesity Father   . CAD Maternal Grandfather   . Alcohol abuse Maternal Grandfather   . Skin cancer Maternal Grandfather   . Pancreatic cancer Maternal Grandmother   . Thyroid disease Maternal Grandmother   . Rheum arthritis Mother   . Skin cancer Mother   . Alcohol abuse Paternal Grandfather   . Colon cancer Paternal Grandfather   . Goiter Paternal Grandfather   . Heart disease Paternal Grandfather   . Alcohol abuse Paternal Grandmother   . Thyroid disease Paternal Grandmother   . Asthma Sister   . Depression Sister   . Obesity Sister   . Sleep apnea Sister   . Heart disease Sister        spontaneous dissection of coronary artery  . Depression Sister     Social History Social History   Tobacco Use  .  Smoking status: Never Smoker  . Smokeless tobacco: Never Used  Vaping Use  . Vaping Use: Never used  Substance Use Topics  . Alcohol use: No  . Drug use: No    Review of Systems  Review of Systems  Constitutional: Positive for fatigue. Negative for fever.  HENT: Negative for congestion and sore throat.   Eyes: Negative for visual disturbance.  Respiratory: Negative for cough and shortness of breath.   Cardiovascular: Negative for chest pain.  Gastrointestinal: Positive for nausea. Negative for abdominal pain, diarrhea and vomiting.  Genitourinary: Negative for flank pain.  Musculoskeletal: Negative for back pain and neck pain.  Skin: Negative for rash and wound.  Neurological: Positive for dizziness and weakness.  All other systems reviewed and are negative.    ____________________________________________  PHYSICAL EXAM:      VITAL SIGNS: ED Triage Vitals  Enc Vitals Group     BP 10/19/20 1326 (!) 146/99     Pulse Rate 10/19/20 1326 84     Resp 10/19/20 1326 19     Temp 10/19/20 1326 98.1 F (36.7 C)     Temp Source 10/19/20 1326 Oral     SpO2 10/19/20 1326 100 %     Weight 10/19/20 1333 (!) 396 lb (179.6 kg)     Height 10/19/20 1345 5\' 9"   (1.753 m)     Head Circumference --      Peak Flow --      Pain Score --      Pain Loc --      Pain Edu? --      Excl. in [place]? --      Physical Exam Vitals and nursing note reviewed.  Constitutional:      General: She is not in acute distress.    Appearance: She is well-developed.  HENT:     Head: Normocephalic and atraumatic.  Eyes:     Conjunctiva/sclera: Conjunctivae normal.  Cardiovascular:     Rate and Rhythm: Normal rate and regular rhythm.     Heart sounds: Normal heart sounds. No murmur heard. No friction rub.  Pulmonary:     Effort: Pulmonary effort is normal. No respiratory distress.     Breath sounds: Normal breath sounds. No wheezing or rales.  Abdominal:     General: There is no distension.     Palpations: Abdomen is soft.     Tenderness: There is no abdominal tenderness.  Musculoskeletal:     Cervical back: Neck supple.  Skin:    General: Skin is warm.     Capillary Refill: Capillary refill takes less than 2 seconds.  Neurological:     Mental Status: She is alert and oriented to person, place, and time.     Motor: No abnormal muscle tone.     Comments: Neurological Exam:  Mental Status: Alert and oriented to person, place, and time. Attention and concentration normal. Speech clear. Recent memory is intact. Cranial Nerves: Visual fields grossly intact. EOMI and PERRLA. No nystagmus noted. Facial sensation intact at forehead, maxillary cheek, and chin/mandible bilaterally. No facial asymmetry or weakness. Hearing grossly normal. Uvula is midline, and palate elevates symmetrically. Normal SCM and trapezius strength. Tongue midline without fasciculations. Motor: Muscle strength 5/5 in proximal and distal UE and LE bilaterally. No pronator drift. Muscle tone normal.  Sensation: Intact to light touch in upper and lower extremities distally bilaterally.  Gait: Deferred. Coordination: Normal FTN bilaterally.           ____________________________________________  LABS (all labs ordered are listed, but only abnormal results are displayed)  Labs Reviewed  BASIC METABOLIC PANEL - Abnormal; Notable for the following components:      Result Value   Glucose, Bld 108 (*)    All other components within normal limits  URINALYSIS, COMPLETE (UACMP) WITH MICROSCOPIC - Abnormal; Notable for the following components:   Color, Urine YELLOW (*)    APPearance HAZY (*)    Ketones, ur 5 (*)    Bacteria, UA RARE (*)    All other components within normal limits  CBC  CBG MONITORING, ED  POC URINE PREG, ED    ____________________________________________  EKG: Normal sinus rhythm, with first-degree AV block.  Ventricular rate 81, PR 220, QRS 88, QTc 457.  No acute ST elevations or depressions.  No EKG evidence of acute ischemia or infarct. ________________________________________  RADIOLOGY All imaging, including plain films, CT scans, and ultrasounds, independently reviewed by me, and interpretations confirmed via formal radiology reads.  ED MD interpretation:   CT head: Normal Noncon CT  Official radiology report(s): CT Head Wo Contrast  Result Date: 10/19/2020 CLINICAL DATA:  Presents with dizziness States she developed dizziness around 11 am States the room is spinning Some nausea EXAM: CT HEAD WITHOUT CONTRAST TECHNIQUE: Contiguous axial images were obtained from the base of the skull through the vertex without intravenous contrast. COMPARISON:  Head CT June 10, 2019. FINDINGS: Brain: No evidence of acute infarction, hemorrhage, hydrocephalus, extra-axial collection or mass lesion/mass effect. Vascular: No hyperdense vessel or unexpected calcification. Skull: Normal. Negative for fracture or focal lesion. Sinuses/Orbits: No acute finding. Other: None. IMPRESSION: Normal non contrasted CT appearance of the brain. Electronically Signed   By: Dahlia Bailiff MD   On: 10/19/2020 15:15     ____________________________________________  PROCEDURES   Procedure(s) performed (including Critical Care):  Procedures  ____________________________________________  INITIAL IMPRESSION / MDM / [place] / ED COURSE  As part of my medical decision making, I reviewed the following data within the [place] notes reviewed and incorporated, Old chart reviewed, Notes from prior ED visits, and [place] Controlled Substance Database       *Claudia Campbell was evaluated in Emergency Department on 10/19/2020 for the symptoms described in the history of present illness. She was evaluated in the context of the global COVID-19 pandemic, which necessitated consideration that the patient might be at risk for infection with the SARS-CoV-2 virus that causes COVID-19. Institutional protocols and algorithms that pertain to the evaluation of patients at risk for COVID-19 are in a state of rapid change based on information released by regulatory bodies including the CDC and federal and state organizations. These policies and algorithms were followed during the patient's care in the ED.  Some ED evaluations and interventions may be delayed as a result of limited staffing during the pandemic.*     Medical Decision Making: 54 old female with history of complex migraines here with acute onset of vertigo and mild headache.  Clinically, suspect complicated migraine with history of hemiplegic as well as occipital migraines.  She has had extensive work-up for multiple sclerosis, stroke, and other neurological pathology. No significant neurological deficit on exam including no signs of other cerebellar abnormality or CN deficits. Pt given migraine cocktail with resolution of vertigo and marked improvement in sx. CT head reviewed and shows NAICA. CBC, BMP unremarkable. No leukocytosis, no infectious sx. UA with mild dehydration likely from vomiting and fluids given here.   Given  h/o  complex migraines w/ resolution of vertigo after migraine meds, nonfocal neuro deficits, reassuring CT, will tx as possible complex migraine with close outpt follow-up. Referred for Neuro f/u.  ____________________________________________  FINAL CLINICAL IMPRESSION(S) / ED DIAGNOSES  Final diagnoses:  Vertigo  Atypical migraine     MEDICATIONS GIVEN DURING THIS VISIT:  Medications  LORazepam (ATIVAN) injection 1 mg (1 mg Intravenous Given 10/19/20 1426)  sodium chloride 0.9 % bolus 1,000 mL (0 mLs Intravenous Stopped 10/19/20 1600)  prochlorperazine (COMPAZINE) injection 10 mg (10 mg Intravenous Given 10/19/20 1454)  diphenhydrAMINE (BENADRYL) injection 25 mg (25 mg Intravenous Given 10/19/20 1454)     ED Discharge Orders         Ordered    diazepam (VALIUM) 5 MG tablet  Every 12 hours PRN        10/19/20 1646    meclizine (ANTIVERT) 25 MG tablet  3 times daily PRN        10/19/20 1646           Note:  This document was prepared using Dragon voice recognition software and may include unintentional dictation errors.   Duffy Bruce, MD 10/19/20 [id number]

## 2020-10-19 NOTE — ED Notes (Signed)
See triage note  Presents with dizziness  States she developed dizziness around 11 am   States the room is spinning   Some nausea

## 2021-04-22 ENCOUNTER — Encounter: Payer: Self-pay | Admitting: Internal Medicine

## 2021-04-25 ENCOUNTER — Encounter
Admission: RE | Disposition: A | Discharge status: Home / Self Care | Payer: Self-pay | Source: Home / Self Care | Attending: Internal Medicine

## 2021-04-25 ENCOUNTER — Ambulatory Visit: Payer: BC Managed Care – PPO | Admitting: Registered Nurse

## 2021-04-25 ENCOUNTER — Ambulatory Visit
Admission: RE | Admit: 2021-04-25 | Discharge: 2021-04-25 | Disposition: A | Discharge status: Home / Self Care | Payer: BC Managed Care – PPO | Attending: Internal Medicine | Admitting: Internal Medicine

## 2021-04-25 ENCOUNTER — Encounter: Payer: Self-pay | Admitting: Internal Medicine

## 2021-04-25 DIAGNOSIS — R1310 Dysphagia, unspecified: Secondary | ICD-10-CM | POA: Diagnosis not present

## 2021-04-25 DIAGNOSIS — K449 Diaphragmatic hernia without obstruction or gangrene: Secondary | ICD-10-CM | POA: Insufficient documentation

## 2021-04-25 DIAGNOSIS — Z79899 Other long term (current) drug therapy: Secondary | ICD-10-CM | POA: Diagnosis not present

## 2021-04-25 DIAGNOSIS — K2100 Gastro-esophageal reflux disease with esophagitis, without bleeding: Secondary | ICD-10-CM | POA: Insufficient documentation

## 2021-04-25 DIAGNOSIS — K640 First degree hemorrhoids: Secondary | ICD-10-CM | POA: Diagnosis not present

## 2021-04-25 DIAGNOSIS — K2950 Unspecified chronic gastritis without bleeding: Secondary | ICD-10-CM | POA: Insufficient documentation

## 2021-04-25 DIAGNOSIS — Z888 Allergy status to other drugs, medicaments and biological substances status: Secondary | ICD-10-CM | POA: Diagnosis not present

## 2021-04-25 DIAGNOSIS — Z91040 Latex allergy status: Secondary | ICD-10-CM | POA: Diagnosis not present

## 2021-04-25 DIAGNOSIS — D123 Benign neoplasm of transverse colon: Secondary | ICD-10-CM | POA: Diagnosis not present

## 2021-04-25 DIAGNOSIS — D122 Benign neoplasm of ascending colon: Secondary | ICD-10-CM | POA: Diagnosis not present

## 2021-04-25 DIAGNOSIS — Z1211 Encounter for screening for malignant neoplasm of colon: Secondary | ICD-10-CM | POA: Insufficient documentation

## 2021-04-25 DIAGNOSIS — Z79890 Hormone replacement therapy (postmenopausal): Secondary | ICD-10-CM | POA: Insufficient documentation

## 2021-04-25 DIAGNOSIS — Z79891 Long term (current) use of opiate analgesic: Secondary | ICD-10-CM | POA: Diagnosis not present

## 2021-04-25 DIAGNOSIS — Z9049 Acquired absence of other specified parts of digestive tract: Secondary | ICD-10-CM | POA: Diagnosis not present

## 2021-04-25 DIAGNOSIS — Z881 Allergy status to other antibiotic agents status: Secondary | ICD-10-CM | POA: Insufficient documentation

## 2021-04-25 DIAGNOSIS — K2270 Barrett's esophagus without dysplasia: Secondary | ICD-10-CM | POA: Diagnosis not present

## 2021-04-25 DIAGNOSIS — K222 Esophageal obstruction: Secondary | ICD-10-CM | POA: Diagnosis not present

## 2021-04-25 DIAGNOSIS — Z800 Family history of malignant neoplasm of digestive organs: Secondary | ICD-10-CM | POA: Diagnosis not present

## 2021-04-25 HISTORY — DX: Anxiety disorder, unspecified: F41.9

## 2021-04-25 HISTORY — PX: COLONOSCOPY WITH PROPOFOL: SHX5780

## 2021-04-25 HISTORY — PX: ESOPHAGOGASTRODUODENOSCOPY (EGD) WITH PROPOFOL: SHX5813

## 2021-04-25 HISTORY — DX: Calculus of kidney: N20.0

## 2021-04-25 SURGERY — ESOPHAGOGASTRODUODENOSCOPY (EGD) WITH PROPOFOL
Anesthesia: General

## 2021-04-25 MED ORDER — KETAMINE HCL 50 MG/5ML IJ SOSY
PREFILLED_SYRINGE | INTRAMUSCULAR | Status: AC
  Filled 2021-04-25: qty 5

## 2021-04-25 MED ORDER — SODIUM CHLORIDE 0.9 % IV SOLN
INTRAVENOUS | Status: DC
  Administered 2021-04-25: 1000 mL via INTRAVENOUS

## 2021-04-25 MED ORDER — GLYCOPYRROLATE 0.2 MG/ML IJ SOLN
INTRAMUSCULAR | Status: AC
  Filled 2021-04-25: qty 1

## 2021-04-25 MED ORDER — KETAMINE HCL 10 MG/ML IJ SOLN
INTRAMUSCULAR | Status: DC | PRN
  Administered 2021-04-25: 50 mg via INTRAVENOUS

## 2021-04-25 MED ORDER — DEXMEDETOMIDINE HCL IN NACL 200 MCG/50ML IV SOLN
INTRAVENOUS | Status: DC | PRN
  Administered 2021-04-25: 8 ug via INTRAVENOUS
  Administered 2021-04-25: 24 ug via INTRAVENOUS

## 2021-04-25 MED ORDER — PROPOFOL 500 MG/50ML IV EMUL
INTRAVENOUS | Status: AC
  Filled 2021-04-25: qty 50

## 2021-04-25 MED ORDER — GLYCOPYRROLATE 0.2 MG/ML IJ SOLN
INTRAMUSCULAR | Status: DC | PRN
Start: 2021-04-25 — End: 2021-04-25
  Administered 2021-04-25 (×2): .2 mg via INTRAVENOUS

## 2021-04-25 MED ORDER — LIDOCAINE HCL (CARDIAC) PF 100 MG/5ML IV SOSY
PREFILLED_SYRINGE | INTRAVENOUS | Status: DC | PRN
  Administered 2021-04-25: 100 mg via INTRAVENOUS

## 2021-04-25 MED ORDER — PROPOFOL 10 MG/ML IV BOLUS
INTRAVENOUS | Status: DC | PRN
  Administered 2021-04-25: 110 mg via INTRAVENOUS

## 2021-04-25 MED ORDER — PROPOFOL 500 MG/50ML IV EMUL
INTRAVENOUS | Status: DC | PRN
  Administered 2021-04-25: 120 ug/kg/min via INTRAVENOUS

## 2021-04-25 NOTE — Op Note (Signed)
[facility] Gastroenterology Patient Name: Claudia Campbell Procedure Date: 04/25/2021 11:30 AM MRN: [id number] Account #: [id number] Date of Birth: [birth year] Admit Type: Outpatient Age: 50 Room: [facility] ENDO ROOM 2 Gender: Female Note Status: Finalized Procedure:             Upper GI endoscopy Indications:           Surveillance for malignancy due to personal history of                         Barrett's esophagus Providers:             Benay Pike. Alice Reichert MD, MD Referring MD:          [facility] (Referring MD) Medicines:             Propofol per Anesthesia Complications:         No immediate complications. Procedure:             Pre-Anesthesia Assessment:                        - The risks and benefits of the procedure and the                         sedation options and risks were discussed with the                         patient. All questions were answered and informed                         consent was obtained.                        - Patient identification and proposed procedure were                         verified prior to the procedure by the nurse. The                         procedure was verified in the procedure room.                        - ASA Grade Assessment: III - A patient with severe                         systemic disease.                        - After reviewing the risks and benefits, the patient                         was deemed in satisfactory condition to undergo the                         procedure.                        After obtaining informed consent, the endoscope was                         passed under direct vision.  Throughout the procedure,                         the patient's blood pressure, pulse, and oxygen                         saturations were monitored continuously. The Endoscope                         was introduced through the mouth, and advanced to the                         third part of duodenum.  The upper GI endoscopy was                         accomplished without difficulty. The patient tolerated                         the procedure well. Findings:      There were esophageal mucosal changes secondary to established       short-segment Barrett's disease present in the distal esophagus. The       maximum longitudinal extent of these mucosal changes was 2 cm in length.       Mucosa was biopsied with a cold forceps for histology in a targeted       manner in the lower third of the esophagus. One specimen bottle was sent       to pathology.      Mucosal changes including feline appearance and circumferential folds       were found in the entire esophagus. Four biopsies were obtained with       cold forceps for evaluation of eosinophilic esophagitis in the middle       third of the esophagus.      One benign-appearing, intrinsic mild stenosis was found at the       gastroesophageal junction. This stenosis measured 1.7 cm (inner       diameter) x less than one cm (in length). The stenosis was traversed.      A 1 cm hiatal hernia was present.      Localized mild inflammation characterized by erythema was found in the       gastric antrum.      Two 10 to 17 mm sessile polyps with no bleeding and no stigmata of       recent bleeding were found in the gastric antrum. Biopsies were taken       with a cold forceps for histology.      The examined duodenum was normal.      The exam was otherwise without abnormality. Impression:            - Esophageal mucosal changes secondary to established                         short-segment Barrett's disease. Biopsied.                        - Esophageal mucosal changes suggestive of                         eosinophilic esophagitis.                        -  Benign-appearing esophageal stenosis.                        - 1 cm hiatal hernia.                        - Gastritis.                        - Two gastric polyps. Biopsied.                         - Normal examined duodenum.                        - The examination was otherwise normal.                        - Biopsies performed in the middle third of the                         esophagus. Recommendation:        - Await pathology results.                        - Proceed with colonoscopy Procedure Code(s):     --- Professional ---                        [phone], Esophagogastroduodenoscopy, flexible,                         transoral; with biopsy, single or multiple Diagnosis Code(s):     --- Professional ---                        K31.7, Polyp of stomach and duodenum                        K29.70, Gastritis, unspecified, without bleeding                        K44.9, Diaphragmatic hernia without obstruction or                         gangrene                        K22.2, Esophageal obstruction                        K22.8, Other specified diseases of esophagus                        K22.70, Barrett's esophagus without dysplasia CPT copyright 2019 American Medical Association. All rights reserved. The codes documented in this report are preliminary and upon coder review may  be revised to meet current compliance requirements. Efrain Sella MD, MD 04/25/2021 1:58:11 PM This report has been signed electronically. Number of Addenda: 0 Note Initiated On: 04/25/2021 11:30 AM Estimated Blood Loss:  Estimated blood loss: none.      [facility]

## 2021-04-25 NOTE — Anesthesia Preprocedure Evaluation (Signed)
Anesthesia Evaluation  Patient identified by MRN, date of birth, ID band Patient awake    Reviewed: Allergy & Precautions, H&P , NPO status , Patient's Chart, lab work & pertinent test results, reviewed documented beta blocker date and time   History of Anesthesia Complications (+) PONV and history of anesthetic complications  Airway Mallampati: II   Neck ROM: full    Dental  (+) Poor Dentition   Pulmonary asthma , sleep apnea and Continuous Positive Airway Pressure Ventilation ,    Pulmonary exam normal        Cardiovascular Exercise Tolerance: Good + dysrhythmias Atrial Fibrillation  Rhythm:regular Rate:Normal     Neuro/Psych  Headaches, PSYCHIATRIC DISORDERS Anxiety Depression  Neuromuscular disease    GI/Hepatic Neg liver ROS, GERD  Medicated,  Endo/Other  Hypothyroidism Morbid obesity  Renal/GU Renal disease  negative genitourinary   Musculoskeletal   Abdominal   Peds  Hematology negative hematology ROS (+)   Anesthesia Other Findings Past Medical History: No date: Abnormal liver function test No date: Anxiety No date: Arthritis No date: Asthma No date: Barrett esophagus No date: Depression No date: First degree AV block No date: GERD (gastroesophageal reflux disease) No date: Hypothyroidism     Comment:  Post ablation No date: Kidney stones No date: Migraines No date: PONV (postoperative nausea and vomiting) No date: Restless leg No date: Sleep apnea     Comment:  NO cpap at this time, broken No date: Tenosynovitis No date: Thyroid nodule No date: Vitamin D deficiency Past Surgical History: No date: ABDOMINAL HYSTERECTOMY 2011: BLADDER SUSPENSION No date: BREAST RECONSTRUCTION No date: BUNIONECTOMY; Bilateral 01/23/2018: CAPSULOTOMY; Left     Comment:  Procedure: CAPSULOTOMY SECOND LEFT;  Surgeon: Edrick Kins, DPM;  Location: [facility];  Service: Podiatry;                 Laterality: Left; No date: CHOLECYSTECTOMY No date: COLONOSCOPY 01/18/2018: ESOPHAGOGASTRODUODENOSCOPY (EGD) WITH PROPOFOL; N/A     Comment:  Procedure: ESOPHAGOGASTRODUODENOSCOPY (EGD) WITH               PROPOFOL;  Surgeon: Lollie Sails, MD;  Location:               [facility] ENDOSCOPY;  Service: Endoscopy;  Laterality: N/A; No date: EXCISION MORTON'S NEUROMA No date: EXPLORATORY LAPAROTOMY 01/23/2018: HAMMER TOE SURGERY; Left     Comment:  Procedure: HAMMER TOE CORRECTION SECOND LEFT;  Surgeon:               Edrick Kins, DPM;  Location: [facility];  Service:               Podiatry;  Laterality: Left; No date: HYSTEROSCOPY W/ ENDOMETRIAL ABLATION No date: KNEE ARTHROSCOPY; Left No date: MENISECTOMY     Comment:  x2 01/23/2018: METATARSAL OSTEOTOMY; Left     Comment:  Procedure: METATARSAL OSTEOTOMY SECOND LEFT;  Surgeon:               Edrick Kins, DPM;  Location: [facility];  Service:               Podiatry;  Laterality: Left; No date: monitoring cranial nerves; Bilateral No date: OSTECTOMY CALCANEUS; Left 2007: RAI ablation No date: TENOTOMY; Left     Comment:  toe 10/1981: TONSILLECTOMY AND ADENOIDECTOMY No date: topaz; Left     Comment:  heel 10/12/2017: TOTAL THYROIDECTOMY No date: TOTAL THYROIDECTOMY  No date: TUBAL LIGATION No date: UPPER GI ENDOSCOPY BMI    Body Mass Index: 57.89 kg/m     Reproductive/Obstetrics negative OB ROS                             Anesthesia Physical Anesthesia Plan  ASA: 3  Anesthesia Plan: General   Post-op Pain Management:    Induction:   PONV Risk Score and Plan:   Airway Management Planned:   Additional Equipment:   Intra-op Plan:   Post-operative Plan:   Informed Consent: I have reviewed the patients History and Physical, chart, labs and discussed the procedure including the risks, benefits and alternatives for the proposed anesthesia with the patient or authorized representative who has  indicated his/her understanding and acceptance.     Dental Advisory Given  Plan Discussed with: CRNA  Anesthesia Plan Comments:         Anesthesia Quick Evaluation

## 2021-04-25 NOTE — Anesthesia Postprocedure Evaluation (Signed)
Anesthesia Post Note  Patient: Harneet Soderquist  Procedure(s) Performed: ESOPHAGOGASTRODUODENOSCOPY (EGD) WITH PROPOFOL COLONOSCOPY WITH PROPOFOL  Patient location during evaluation: PACU Anesthesia Type: General Level of consciousness: awake and alert Pain management: pain level controlled Vital Signs Assessment: post-procedure vital signs reviewed and stable Respiratory status: spontaneous breathing, nonlabored ventilation, respiratory function stable and patient connected to nasal cannula oxygen Cardiovascular status: blood pressure returned to baseline and stable Postop Assessment: no apparent nausea or vomiting Anesthetic complications: no   No notable events documented.   Last Vitals:  Vitals:   04/25/21 1430 04/25/21 1440  BP: 98/63 107/67  Pulse: 78 80  Resp: 17 (!) 21  Temp:    SpO2: 100% 95%    Last Pain:  Vitals:   04/25/21 1430  TempSrc:   PainSc: 0-No pain                 Molli Barrows

## 2021-04-25 NOTE — Interval H&P Note (Signed)
History and Physical Interval Note:  04/25/2021 1:27 PM  Claudia Campbell  has presented today for surgery, with the diagnosis of FAMILY HX.OF [place].  The various methods of treatment have been discussed with the patient and family. After consideration of risks, benefits and other options for treatment, the patient has consented to  Procedure(s): ESOPHAGOGASTRODUODENOSCOPY (EGD) WITH PROPOFOL (N/A) COLONOSCOPY WITH PROPOFOL (N/A) as a surgical intervention.  The patient's history has been reviewed, patient examined, no change in status, stable for surgery.  I have reviewed the patient's chart and labs.  Questions were answered to the patient's satisfaction.     [place], [place]

## 2021-04-25 NOTE — Op Note (Signed)
[facility] Gastroenterology Patient Name: Claudia Campbell Procedure Date: 04/25/2021 11:29 AM MRN: [id number] Account #: [id number] Date of Birth: 08-15-1971 Admit Type: Outpatient Age: 50 Room: [facility] ENDO ROOM 2 Gender: Female Note Status: Finalized Procedure:             Colonoscopy Indications:           Screening in patient at increased risk: Family history                         of 1st-degree relative with colorectal cancer before                         age 59 years Providers:             Benay Pike. Alice Reichert MD, MD Referring MD:          [facility] [place] (Referring MD) Medicines:             Propofol per Anesthesia Complications:         No immediate complications. Procedure:             Pre-Anesthesia Assessment:                        - The risks and benefits of the procedure and the                         sedation options and risks were discussed with the                         patient. All questions were answered and informed                         consent was obtained.                        - The risks and benefits of the procedure and the                         sedation options and risks were discussed with the                         patient. All questions were answered and informed                         consent was obtained.                        - Patient identification and proposed procedure were                         verified prior to the procedure by the nurse. The                         procedure was verified in the procedure room.                        - ASA Grade Assessment: III - A patient with severe  systemic disease.                        - After reviewing the risks and benefits, the patient                         was deemed in satisfactory condition to undergo the                         procedure.                        After obtaining informed consent, the colonoscope was                          passed under direct vision. Throughout the procedure,                         the patient's blood pressure, pulse, and oxygen                         saturations were monitored continuously. The                         Colonoscope was introduced through the anus and                         advanced to the the cecum, identified by appendiceal                         orifice and ileocecal valve. The colonoscopy was                         somewhat difficult due to a redundant colon.                         Successful completion of the procedure was aided by                         straightening and shortening the scope to obtain bowel                         loop reduction. The patient tolerated the procedure                         well. The quality of the bowel preparation was                         adequate. The ileocecal valve, appendiceal orifice,                         and rectum were photographed. Findings:      The perianal and digital rectal examinations were normal. Pertinent       negatives include normal sphincter tone and no palpable rectal lesions.      Non-bleeding internal hemorrhoids were found during retroflexion. The       hemorrhoids were Grade I (internal hemorrhoids that do not prolapse).      A 5 mm polyp was found in the ascending colon. The polyp  was sessile.       The polyp was removed with a jumbo cold forceps. Resection and retrieval       were complete.      Three sessile polyps were found in the transverse colon. The polyps were       3 to 5 mm in size. These polyps were removed with a jumbo cold forceps.       Resection and retrieval were complete.      The exam was otherwise without abnormality. Impression:            - Non-bleeding internal hemorrhoids.                        - One 5 mm polyp in the ascending colon, removed with                         a jumbo cold forceps. Resected and retrieved.                        - Three 3 to 5 mm polyps in the  transverse colon,                         removed with a jumbo cold forceps. Resected and                         retrieved.                        - The examination was otherwise normal. Recommendation:        - Await pathology results from EGD, also performed                         today.                        - Patient has a contact number available for                         emergencies. The signs and symptoms of potential                         delayed complications were discussed with the patient.                         Return to normal activities tomorrow. Written                         discharge instructions were provided to the patient.                        - Resume previous diet.                        - Continue present medications.                        - Repeat colonoscopy is recommended for surveillance.                         The colonoscopy date will be determined after  pathology results from today's exam become available                         for review.                        - Return to GI office PRN.                        - The findings and recommendations were discussed with                         the patient. Procedure Code(s):     --- Professional ---                        [phone], Colonoscopy, flexible; with biopsy, single or                         multiple Diagnosis Code(s):     --- Professional ---                        K64.0, First degree hemorrhoids                        Z80.0, Family history of malignant neoplasm of                         digestive organs                        K63.5, Polyp of colon CPT copyright 2019 American Medical Association. All rights reserved. The codes documented in this report are preliminary and upon coder review may  be revised to meet current compliance requirements. Efrain Sella MD, MD 04/25/2021 2:18:45 PM This report has been signed electronically. Number of Addenda: 0 Note  Initiated On: 04/25/2021 11:29 AM Scope Withdrawal Time: 0 hours 9 minutes 25 seconds  Total Procedure Duration: 0 hours 13 minutes 48 seconds  Estimated Blood Loss:  Estimated blood loss: none.      [facility]

## 2021-04-25 NOTE — H&P (Signed)
Outpatient short stay form Pre-procedure 04/25/2021 1:25 PM Claudia Campbell Claudia Campbell, M.D.  Primary Physician: Dr. Janyce Llanos [facility])  Reason for visit:  Dysphagia, hx Barrett's esophagus, family history of colon cancer   History of present illness:  Patient with intermittent dysphagia to solids. No weight loss, hemetemesis or abdominal pain. Has hx of Barrett's esophagus w/o dysplasia in 1271.  50 year old patient presenting for family history of colon cancer. Patient denies any change in bowel habits, rectal bleeding or involuntary weight loss.     Current Facility-Administered Medications:    0.9 %  sodium chloride infusion, , Intravenous, Continuous, Klee Kolek, Benay Pike, MD, Last Rate: 20 mL/hr at 04/25/21 1315, 1,000 mL at 04/25/21 1315  Medications Prior to Admission  Medication Sig Dispense Refill Last Dose   albuterol (VENTOLIN HFA) 108 (90 Base) MCG/ACT inhaler Inhale 2 puffs into the lungs every 6 (six) hours as needed for wheezing or shortness of breath.   Past Week   augmented betamethasone dipropionate (DIPROLENE-AF) 0.05 % cream APPLY THIN FILM TO HANDS AND FEET SITES TWICE DAILY UNTIL CLEAR   Past Week   cholecalciferol (VITAMIN D3) 10 MCG (400 UNIT) TABS tablet Take 400 Units by mouth.   Past Week   Clobetasol Propionate 0.05 % shampoo Apply topically 2 (two) times daily.   Past Week   diazepam (VALIUM) 5 MG tablet Take 1 tablet (5 mg total) by mouth every 12 (twelve) hours as needed (severe vertigo). 6 tablet 0 Past Month   esomeprazole (NEXIUM) 40 MG capsule Take 40 mg by mouth 2 (two) times daily before a meal.    04/24/2021 at 1100am   fluconazole (DIFLUCAN) 150 MG tablet TAKE 1 TABLET BY MOUTH TODAY AND 1 TABLET BY MOUTH IN 3 DAYS   Past Month   Fluocinolone Acetonide Scalp 0.01 % OIL Apply topically.   Past Week   fluticasone (FLONASE) 50 MCG/ACT nasal spray Place 1 spray into both nostrils daily.   04/24/2021 at 0900am   meclizine (ANTIVERT) 25 MG tablet Take 1 tablet  (25 mg total) by mouth 3 (three) times daily as needed for dizziness. 20 tablet 0 Past Month   montelukast (SINGULAIR) 10 MG tablet Take 10 mg by mouth at bedtime.    04/24/2021 at 1800   Multiple Vitamins-Minerals (MULTIVITAMIN WITH MINERALS) tablet Take 1 tablet by mouth daily.      pramipexole (MIRAPEX) 1 MG tablet Take 2 mg by mouth at bedtime.    Past Week   sertraline (ZOLOFT) 100 MG tablet Take 150 mg by mouth daily.    04/24/2021   SYNTHROID 200 MCG tablet Take 200 mcg by mouth daily before breakfast.    04/25/2021 at 0530am   zonisamide (ZONEGRAN) 100 MG capsule Take 100 mg by mouth daily.   Past Week   ibuprofen (ADVIL) 800 MG tablet       traMADol (ULTRAM) 50 MG tablet Take 50 mg by mouth every 6 (six) hours.        Allergies  Allergen Reactions   Aspartame Other (See Comments)    Passes out   Ciprofloxacin Hives   Ilosone [Erythromycin] Hives   Latex Swelling    Around mouth   Dicyclomine Palpitations     Past Medical History:  Diagnosis Date   Abnormal liver function test    Anxiety    Arthritis    Asthma    Barrett esophagus    Depression    First degree AV block    GERD (gastroesophageal  reflux disease)    Hypothyroidism    Post ablation   Kidney stones    Migraines    PONV (postoperative nausea and vomiting)    Restless leg    Sleep apnea    NO cpap at this time, broken   Tenosynovitis    Thyroid nodule    Vitamin D deficiency     Review of systems:  Otherwise negative.    Physical Exam  Gen: Alert, oriented. Appears stated age.  HEENT: [place]/AT. PERRLA. Lungs: CTA, no wheezes. CV: RR nl S1, S2. Abd: soft, benign, no masses. BS+ Ext: No edema. Pulses 2+    Planned procedures: Proceed with EGD and colonoscopy. The patient understands the nature of the planned procedure, indications, risks, alternatives and potential complications including but not limited to bleeding, infection, perforation, damage to internal organs and possible oversedation/side  effects from anesthesia. The patient agrees and gives consent to proceed.  Please refer to procedure notes for findings, recommendations and patient disposition/instructions.     [name] Claudia Campbell, M.D. Gastroenterology 04/25/2021  1:25 PM

## 2021-04-25 NOTE — Transfer of Care (Signed)
Immediate Anesthesia Transfer of Care Note  Patient: Claudia Campbell  Procedure(s) Performed: ESOPHAGOGASTRODUODENOSCOPY (EGD) WITH PROPOFOL COLONOSCOPY WITH PROPOFOL  Patient Location: Endoscopy Unit  Anesthesia Type:General  Level of Consciousness: drowsy  Airway & Oxygen Therapy: Patient Spontanous Breathing  Post-op Assessment: Report given to RN and Post -op Vital signs reviewed and stable  Post vital signs: Reviewed and stable  Last Vitals:  Vitals Value Taken Time  BP    Temp    Pulse 78 04/25/21 1424  Resp 17 04/25/21 1424  SpO2 100 % 04/25/21 1424  Vitals shown include unvalidated device data.  Last Pain: There were no vitals filed for this visit.    Patients Stated Pain Goal: 0 ([phone] [id number])  Complications: No notable events documented.

## 2021-04-26 ENCOUNTER — Encounter: Payer: Self-pay | Admitting: Internal Medicine

## 2021-04-27 ENCOUNTER — Other Ambulatory Visit: Payer: Self-pay

## 2021-04-27 ENCOUNTER — Other Ambulatory Visit: Payer: Self-pay | Admitting: Internal Medicine

## 2021-04-27 ENCOUNTER — Ambulatory Visit
Admission: RE | Admit: 2021-04-27 | Discharge: 2021-04-27 | Disposition: A | Discharge status: Home / Self Care | Payer: BC Managed Care – PPO | Source: Ambulatory Visit | Attending: Internal Medicine | Admitting: Internal Medicine

## 2021-04-27 DIAGNOSIS — Z9889 Other specified postprocedural states: Secondary | ICD-10-CM | POA: Diagnosis present

## 2021-04-27 DIAGNOSIS — R1310 Dysphagia, unspecified: Secondary | ICD-10-CM

## 2021-04-27 LAB — SURGICAL PATHOLOGY

## 2021-04-27 MED ORDER — IOHEXOL 300 MG/ML  SOLN
250.0000 mL | Freq: Once | INTRAMUSCULAR | Status: AC | PRN
  Administered 2021-04-27: 120 mL via ORAL

## 2021-04-28 ENCOUNTER — Other Ambulatory Visit: Payer: Self-pay | Admitting: Internal Medicine

## 2021-04-28 DIAGNOSIS — R1310 Dysphagia, unspecified: Secondary | ICD-10-CM

## 2021-04-28 DIAGNOSIS — Z9889 Other specified postprocedural states: Secondary | ICD-10-CM

## 2021-05-03 ENCOUNTER — Emergency Department
Admission: EM | Admit: 2021-05-03 | Discharge: 2021-05-03 | Disposition: A | Discharge status: Home / Self Care | Payer: BC Managed Care – PPO | Attending: Emergency Medicine | Admitting: Emergency Medicine

## 2021-05-03 ENCOUNTER — Emergency Department: Payer: BC Managed Care – PPO

## 2021-05-03 ENCOUNTER — Other Ambulatory Visit: Payer: Self-pay

## 2021-05-03 ENCOUNTER — Encounter: Payer: Self-pay | Admitting: Emergency Medicine

## 2021-05-03 DIAGNOSIS — Z5321 Procedure and treatment not carried out due to patient leaving prior to being seen by health care provider: Secondary | ICD-10-CM | POA: Diagnosis not present

## 2021-05-03 DIAGNOSIS — R079 Chest pain, unspecified: Secondary | ICD-10-CM | POA: Insufficient documentation

## 2021-05-03 LAB — CBC
HCT: 41 % (ref 36.0–46.0)
Hemoglobin: 13.6 g/dL (ref 12.0–15.0)
MCH: 28.3 pg (ref 26.0–34.0)
MCHC: 33.2 g/dL (ref 30.0–36.0)
MCV: 85.4 fL (ref 80.0–100.0)
Platelets: 350 10*3/uL (ref 150–400)
RBC: 4.8 MIL/uL (ref 3.87–5.11)
RDW: 14.5 % (ref 11.5–15.5)
WBC: 10.5 10*3/uL (ref 4.0–10.5)
nRBC: 0 % (ref 0.0–0.2)

## 2021-05-03 LAB — TROPONIN I (HIGH SENSITIVITY): Troponin I (High Sensitivity): 3 ng/L (ref <18)

## 2021-05-03 LAB — BASIC METABOLIC PANEL
Anion gap: 9 (ref 5–15)
BUN: 15 mg/dL (ref 6–20)
CO2: 24 mmol/L (ref 22–32)
Calcium: 9.3 mg/dL (ref 8.9–10.3)
Chloride: 102 mmol/L (ref 98–111)
Creatinine, Ser: 0.79 mg/dL (ref 0.44–1.00)
GFR, Estimated: >60 mL/min (ref >60)
Glucose, Bld: 116 mg/dL — ABNORMAL HIGH (ref 70–99)
Potassium: 4 mmol/L (ref 3.5–5.1)
Sodium: 135 mmol/L (ref 135–145)

## 2021-05-03 NOTE — ED Triage Notes (Signed)
Pt reports that she feels like someone is sitting on her chest, she denies any N/V, diaphoresis or SHOB. It does radiate down her left arm and up the left side of her neck that began late last night.

## 2021-08-05 ENCOUNTER — Ambulatory Visit: Payer: BC Managed Care – PPO | Admitting: Podiatry

## 2021-08-09 ENCOUNTER — Ambulatory Visit (INDEPENDENT_AMBULATORY_CARE_PROVIDER_SITE_OTHER): Payer: BC Managed Care – PPO

## 2021-08-09 ENCOUNTER — Ambulatory Visit: Payer: BC Managed Care – PPO | Admitting: Podiatry

## 2021-08-09 ENCOUNTER — Other Ambulatory Visit: Payer: Self-pay

## 2021-08-09 DIAGNOSIS — M659 Synovitis and tenosynovitis, unspecified: Secondary | ICD-10-CM

## 2021-08-09 DIAGNOSIS — M76822 Posterior tibial tendinitis, left leg: Secondary | ICD-10-CM

## 2021-08-09 MED ORDER — METHYLPREDNISOLONE 4 MG PO TBPK: ORAL_TABLET | ORAL | 0 refills | Status: DC

## 2021-08-09 MED ORDER — BETAMETHASONE SOD PHOS & ACET 6 (3-3) MG/ML IJ SUSP
3.0000 mg | Freq: Once | INTRAMUSCULAR | Status: AC
  Administered 2021-08-09: 3 mg via INTRA_ARTICULAR

## 2021-08-09 NOTE — Progress Notes (Signed)
° ° °  HPI: 50 y.o. female presenting today for a new complaint regarding an acute injury that the patient sustained on 07/24/2021.  Patient states that she sustained a trip and fall injury and injured her right knee and left ankle.  She did follow-up at the [facility] urgent care on 07/26/2021.  She says that they dispensed an ankle brace but it made the pain worse in the ankle.  She presents for follow-up treatment and evaluation  Past Medical History:  Diagnosis Date   Abnormal liver function test    Anxiety    Arthritis    Asthma    Barrett esophagus    Depression    First degree AV block    GERD (gastroesophageal reflux disease)    Hypothyroidism    Post ablation   Kidney stones    Migraines    PONV (postoperative nausea and vomiting)    Restless leg    Sleep apnea    NO cpap at this time, broken   Tenosynovitis    Thyroid nodule    Vitamin D deficiency      Physical Exam: General: The patient is alert and oriented x3 in no acute distress.  Dermatology: Skin is warm, dry and supple bilateral lower extremities. Negative for open lesions or macerations.  Vascular: Palpable pedal pulses bilaterally. No edema or erythema noted. Capillary refill within normal limits.  Neurological: Epicritic and protective threshold grossly intact bilaterally.   Musculoskeletal Exam: Pain on palpation noted along posterior tibial tendon of the left lower extremity. Range of motion within normal limits. Muscle strength 5/5 in all muscle groups bilateral lower extremities.  Radiographic Exam:  Normal osseous mineralization. Joint spaces preserved. No fracture or dislocation identified.    Assessment: 1. Posterior tibial tendinitis left   Plan of Care:  1. Patient was evaluated. Radiographs were reviewed today. 2. Injection of 0.5 mL Celestone Soluspan injected into the posterior tibial tendon sheath.  3.  Prescription for Medrol Dosepak 4.  Patient has a cam boot at home.  Recommend that she  patient wears the cam boot daily x3 weeks 5.  Return to clinic in 3 weeks   Edrick Kins, DPM [facility]  Dr. Edrick Kins, DPM    [address], [place] [address]                Office [phone]  Fax [phone]

## 2021-09-06 ENCOUNTER — Ambulatory Visit: Payer: BC Managed Care – PPO | Admitting: Podiatry

## 2021-09-06 ENCOUNTER — Encounter: Payer: Self-pay | Admitting: Podiatry

## 2021-09-06 ENCOUNTER — Other Ambulatory Visit: Payer: Self-pay

## 2021-09-06 DIAGNOSIS — M76822 Posterior tibial tendinitis, left leg: Secondary | ICD-10-CM | POA: Diagnosis not present

## 2021-09-09 ENCOUNTER — Ambulatory Visit: Payer: BC Managed Care – PPO

## 2021-09-09 ENCOUNTER — Other Ambulatory Visit: Payer: Self-pay

## 2021-09-09 DIAGNOSIS — M76822 Posterior tibial tendinitis, left leg: Secondary | ICD-10-CM

## 2021-09-09 DIAGNOSIS — M76821 Posterior tibial tendinitis, right leg: Secondary | ICD-10-CM | POA: Diagnosis not present

## 2021-09-09 NOTE — Progress Notes (Signed)
SITUATION Reason for Consult: Evaluation for Bilateral Custom Foot Orthoses Patient / Caregiver Report: Patient wants comfortable orthotics  OBJECTIVE DATA: Patient History / Diagnosis:    ICD-10-CM   1. Posterior tibial tendinitis, left  M76.822       Current or Previous Devices: Historical, patient did not have them with her  Foot Examination: Skin presentation:   Intact Ulcers & Callousing:   None and no history Toe / Foot Deformities:  Pes planus Weight Bearing Presentation:  planus Sensation:    Intact  ORTHOTIC RECOMMENDATION Recommended Device: 1x pair of custom functional foot orthotics  GOALS OF ORTHOSES - Reduce Pain - Prevent Foot Deformity - Prevent Progression of Further Foot Deformity - Relieve Pressure - Improve the Overall Biomechanical Function of the Foot and Lower Extremity.  ACTIONS PERFORMED Patient was casted for Foot Orthoses via crush box. Procedure was explained and patient tolerated procedure well. All questions were answered and concerns addressed.  PLAN Potential out of pocket cost was communicated to patient. Casts are to be sent to [employer] for fabrication. Patient is to be called for fitting when devices are ready.

## 2021-09-18 NOTE — Progress Notes (Signed)
° ° °  HPI: 51 y.o. female presenting today for follow-up evaluation of posterior tibial tendinitis to the left lower extremity.  Patient states that the injection did help somewhat last visit.  She also completed the Medrol Dosepak and has been wearing the cam boot.  Pain initially began when she had an acute ankle injury sustained on 07/24/2021.  She tripped and fell and injured her right knee and left ankle.  She did not follow-up at [facility] urgent care on 07/26/2021.  I dispensed an ankle brace but she says that made it worse.  She presents for follow-up treatment and evaluation  Past Medical History:  Diagnosis Date   Abnormal liver function test    Anxiety    Arthritis    Asthma    Barrett esophagus    Depression    First degree AV block    GERD (gastroesophageal reflux disease)    Hypothyroidism    Post ablation   Kidney stones    Migraines    PONV (postoperative nausea and vomiting)    Restless leg    Sleep apnea    NO cpap at this time, broken   Tenosynovitis    Thyroid nodule    Vitamin D deficiency      Physical Exam: General: The patient is alert and oriented x3 in no acute distress.  Dermatology: Skin is warm, dry and supple bilateral lower extremities. Negative for open lesions or macerations.  Vascular: Palpable pedal pulses bilaterally. No edema or erythema noted. Capillary refill within normal limits.  Neurological: Epicritic and protective threshold grossly intact bilaterally.   Musculoskeletal Exam: There continues to be some slight pain on palpation noted along posterior tibial tendon of the left lower extremity. Range of motion within normal limits. Muscle strength 5/5 in all muscle groups bilateral lower extremities. Will Assessment: 1. Posterior tibial tendinitis left; improved   Plan of Care:  1. Patient was evaluated.  2.  Patient may discontinue the cam boot now. 3.  Recommend good supportive shoes and sneakers 4.  Today were going to set up an  appointment for the patient for custom molded orthotics with the Pedorthist here in the office 5.  Return to clinic as needed   Edrick Kins, DPM [facility]  Dr. Edrick Kins, DPM    [address], [place] [address]                Office [phone]  Fax [phone]

## 2021-10-06 ENCOUNTER — Telehealth: Payer: Self-pay | Admitting: Podiatry

## 2021-10-06 NOTE — Telephone Encounter (Signed)
Orthotics in.. lvm for pt to call to schedule an appt to pick them up. °

## 2021-10-15 ENCOUNTER — Encounter: Payer: Self-pay | Admitting: Podiatry

## 2021-10-17 ENCOUNTER — Other Ambulatory Visit: Payer: Self-pay | Admitting: Podiatry

## 2021-10-17 DIAGNOSIS — M659 Synovitis and tenosynovitis, unspecified: Secondary | ICD-10-CM

## 2021-10-17 DIAGNOSIS — M76822 Posterior tibial tendinitis, left leg: Secondary | ICD-10-CM

## 2021-10-21 ENCOUNTER — Other Ambulatory Visit: Payer: Self-pay

## 2021-10-21 ENCOUNTER — Ambulatory Visit: Payer: BC Managed Care – PPO

## 2021-10-21 DIAGNOSIS — M76822 Posterior tibial tendinitis, left leg: Secondary | ICD-10-CM

## 2021-10-21 NOTE — Progress Notes (Signed)
SITUATION: Reason for Visit: Fitting and Delivery of Custom Fabricated Foot Orthoses Patient Report: Patient reports comfort and is satisfied with device.  OBJECTIVE DATA: Patient History / Diagnosis:     ICD-10-CM   1. Posterior tibial tendinitis, left  M76.822       Provided Device:  Custom Functional Foot Orthotics     [employer]: [id number]  GOAL OF ORTHOSIS - Improve gait - Decrease energy expenditure - Improve Balance - Provide Triplanar stability of foot complex - Facilitate motion  ACTIONS PERFORMED Patient was fit with foot orthotics trimmed to shoe last. Patient tolerated fittign procedure.   Patient was provided with verbal and written instruction and demonstration regarding donning, doffing, wear, care, proper fit, function, purpose, cleaning, and use of the orthosis and in all related precautions and risks and benefits regarding the orthosis.  Patient was also provided with verbal instruction regarding how to report any failures or malfunctions of the orthosis and necessary follow up care. Patient was also instructed to contact our office regarding any change in status that may affect the function of the orthosis.  Patient demonstrated independence with proper donning, doffing, and fit and verbalized understanding of all instructions.  PLAN: Patient is to follow up in one week or as necessary (PRN). All questions were answered and concerns addressed. Plan of care was discussed with and agreed upon by the patient.

## 2021-11-03 ENCOUNTER — Ambulatory Visit
Admission: RE | Admit: 2021-11-03 | Discharge: 2021-11-03 | Disposition: A | Discharge status: Home / Self Care | Payer: BC Managed Care – PPO | Source: Ambulatory Visit | Attending: Podiatry | Admitting: Podiatry

## 2021-11-03 ENCOUNTER — Other Ambulatory Visit: Payer: Self-pay

## 2021-11-03 DIAGNOSIS — M659 Synovitis and tenosynovitis, unspecified: Secondary | ICD-10-CM

## 2021-11-03 DIAGNOSIS — M76822 Posterior tibial tendinitis, left leg: Secondary | ICD-10-CM

## 2021-11-29 ENCOUNTER — Encounter: Payer: Self-pay | Admitting: Podiatry

## 2021-11-29 ENCOUNTER — Ambulatory Visit: Payer: BC Managed Care – PPO | Admitting: Podiatry

## 2021-11-29 DIAGNOSIS — M19072 Primary osteoarthritis, left ankle and foot: Secondary | ICD-10-CM

## 2021-11-30 NOTE — Progress Notes (Signed)
? ?HPI: 51 y.o. female presenting today for follow-up evaluation of left foot and ankle pain.  She had MRIs performed on 11/03/2021 and presents today to review the MRIs and discuss further treatment options ? ?Past Medical History:  ?Diagnosis Date  ? Abnormal liver function test   ? Anxiety   ? Arthritis   ? Asthma   ? Barrett esophagus   ? Depression   ? First degree AV block   ? GERD (gastroesophageal reflux disease)   ? Hypothyroidism   ? Post ablation  ? Kidney stones   ? Migraines   ? PONV (postoperative nausea and vomiting)   ? Restless leg   ? Sleep apnea   ? NO cpap at this time, broken  ? Tenosynovitis   ? Thyroid nodule   ? Vitamin D deficiency   ? ? ?Past Surgical History:  ?Procedure Laterality Date  ? ABDOMINAL HYSTERECTOMY    ? BLADDER SUSPENSION  2011  ? BREAST RECONSTRUCTION    ? BUNIONECTOMY Bilateral   ? CAPSULOTOMY Left 01/23/2018  ? Procedure: CAPSULOTOMY SECOND LEFT;  Surgeon: Edrick Kins, DPM;  Location: [facility];  Service: Podiatry;  Laterality: Left;  ? CHOLECYSTECTOMY    ? COLONOSCOPY    ? COLONOSCOPY WITH PROPOFOL N/A 04/25/2021  ? Procedure: COLONOSCOPY WITH PROPOFOL;  Surgeon: Toledo, Benay Pike, MD;  Location: [facility] ENDOSCOPY;  Service: Gastroenterology;  Laterality: N/A;  ? ESOPHAGOGASTRODUODENOSCOPY (EGD) WITH PROPOFOL N/A 01/18/2018  ? Procedure: ESOPHAGOGASTRODUODENOSCOPY (EGD) WITH PROPOFOL;  Surgeon: Lollie Sails, MD;  Location: [facility] ENDOSCOPY;  Service: Endoscopy;  Laterality: N/A;  ? ESOPHAGOGASTRODUODENOSCOPY (EGD) WITH PROPOFOL N/A 04/25/2021  ? Procedure: ESOPHAGOGASTRODUODENOSCOPY (EGD) WITH PROPOFOL;  Surgeon: Toledo, Benay Pike, MD;  Location: [facility] ENDOSCOPY;  Service: Gastroenterology;  Laterality: N/A;  ? EXCISION MORTON'S NEUROMA    ? EXPLORATORY LAPAROTOMY    ? HAMMER TOE SURGERY Left 01/23/2018  ? Procedure: HAMMER TOE CORRECTION SECOND LEFT;  Surgeon: Edrick Kins, DPM;  Location: [facility];  Service: Podiatry;  Laterality: Left;  ? HYSTEROSCOPY W/ ENDOMETRIAL  ABLATION    ? KNEE ARTHROSCOPY Left   ? MENISECTOMY    ? x2  ? METATARSAL OSTEOTOMY Left 01/23/2018  ? Procedure: METATARSAL OSTEOTOMY SECOND LEFT;  Surgeon: Edrick Kins, DPM;  Location: [facility];  Service: Podiatry;  Laterality: Left;  ? monitoring cranial nerves Bilateral   ? OSTECTOMY CALCANEUS Left   ? RAI ablation  2007  ? TENOTOMY Left   ? toe  ? TONSILLECTOMY AND ADENOIDECTOMY  10/1981  ? topaz Left   ? heel  ? TOTAL THYROIDECTOMY  10/12/2017  ? TOTAL THYROIDECTOMY    ? TUBAL LIGATION    ? UPPER GI ENDOSCOPY    ? ? ?Allergies  ?Allergen Reactions  ? Aspartame Other (See Comments)  ?  Passes out  ? Ciprofloxacin Hives  ? Ilosone [Erythromycin] Hives  ? Latex Swelling  ?  Around mouth  ? Dicyclomine Palpitations  ? ?  ?Physical Exam: ?General: The patient is alert and oriented x3 in no acute distress. ? ?Dermatology: Skin is warm, dry and supple bilateral lower extremities. Negative for open lesions or macerations. ? ?Vascular: Palpable pedal pulses bilaterally. Capillary refill within normal limits.  Negative for any significant edema or erythema ? ?Neurological: Light touch and protective threshold grossly intact ? ?Musculoskeletal Exam: No pedal deformities noted.  There continues to be pain and tenderness throughout the left foot and ankle ? ?MR FOOT LT WO CONTRAST 11/03/2021: ?IMPRESSION: ?1. Postsurgical  changes of the first and second metatarsals as seen ?on prior remote radiographs. ?2. 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 ?3. Moderate tarsometatarsal joint osteoarthritis diffusely. ? ?MR ANKLE LT WO CONTRAST 11/03/2021: ?IMPRESSION: ?1. Mild-to-moderate peroneus longus and brevis and mild posterior ?tibial tenosynovitis. ?2. Mild-to-moderate degenerative changes of the tibiotalar and ?posterior subtalar joints. Moderate midfoot osteoarthritis, as ?above. ?3. Moderate edema and swelling of  the ankle subcutaneous fat ?diffusely. ? ?Assessment: ?1.  DJD left foot and ankle ? ? ?Plan of Care:  ?1. Patient evaluated.  Both MRIs reviewed.  ?2.  Patient will continue conservative treatment for now. ?3.  Continue custom molded orthotics ?4.  Continue anti-inflammatory as needed ?5.  Return to clinic as needed ? ?  ?  ?Edrick Kins, DPM ?[facility] ? ?Dr. Edrick Kins, DPM  ?  ?[address]                                        ?[place], [place] [address]                ?Office [phone]  ?Fax [phone] ? ? ? ? ?

## 2022-10-31 ENCOUNTER — Ambulatory Visit: Payer: BC Managed Care – PPO | Admitting: Podiatry

## 2022-12-12 ENCOUNTER — Inpatient Hospital Stay
Admission: EM | Admit: 2022-12-12 | Discharge: 2022-12-15 | DRG: 287 | Disposition: A | Discharge status: Home / Self Care | Payer: BC Managed Care – PPO | Attending: Internal Medicine | Admitting: Internal Medicine

## 2022-12-12 ENCOUNTER — Emergency Department: Payer: BC Managed Care – PPO

## 2022-12-12 ENCOUNTER — Other Ambulatory Visit: Payer: Self-pay

## 2022-12-12 DIAGNOSIS — Z83719 Family history of colon polyps, unspecified: Secondary | ICD-10-CM

## 2022-12-12 DIAGNOSIS — Z881 Allergy status to other antibiotic agents status: Secondary | ICD-10-CM

## 2022-12-12 DIAGNOSIS — Z811 Family history of alcohol abuse and dependence: Secondary | ICD-10-CM

## 2022-12-12 DIAGNOSIS — M1990 Unspecified osteoarthritis, unspecified site: Secondary | ICD-10-CM | POA: Diagnosis present

## 2022-12-12 DIAGNOSIS — I2542 Coronary artery dissection: Principal | ICD-10-CM | POA: Diagnosis present

## 2022-12-12 DIAGNOSIS — F32A Depression, unspecified: Secondary | ICD-10-CM | POA: Diagnosis present

## 2022-12-12 DIAGNOSIS — R079 Chest pain, unspecified: Secondary | ICD-10-CM | POA: Diagnosis present

## 2022-12-12 DIAGNOSIS — I2510 Atherosclerotic heart disease of native coronary artery without angina pectoris: Secondary | ICD-10-CM | POA: Diagnosis present

## 2022-12-12 DIAGNOSIS — Z800 Family history of malignant neoplasm of digestive organs: Secondary | ICD-10-CM

## 2022-12-12 DIAGNOSIS — Z818 Family history of other mental and behavioral disorders: Secondary | ICD-10-CM

## 2022-12-12 DIAGNOSIS — I440 Atrioventricular block, first degree: Secondary | ICD-10-CM | POA: Diagnosis present

## 2022-12-12 DIAGNOSIS — K2270 Barrett's esophagus without dysplasia: Secondary | ICD-10-CM | POA: Diagnosis present

## 2022-12-12 DIAGNOSIS — Z79890 Hormone replacement therapy (postmenopausal): Secondary | ICD-10-CM

## 2022-12-12 DIAGNOSIS — Z8249 Family history of ischemic heart disease and other diseases of the circulatory system: Secondary | ICD-10-CM

## 2022-12-12 DIAGNOSIS — Z83438 Family history of other disorder of lipoprotein metabolism and other lipidemia: Secondary | ICD-10-CM

## 2022-12-12 DIAGNOSIS — Z87442 Personal history of urinary calculi: Secondary | ICD-10-CM

## 2022-12-12 DIAGNOSIS — E890 Postprocedural hypothyroidism: Secondary | ICD-10-CM | POA: Diagnosis present

## 2022-12-12 DIAGNOSIS — F419 Anxiety disorder, unspecified: Secondary | ICD-10-CM | POA: Diagnosis present

## 2022-12-12 DIAGNOSIS — Z90710 Acquired absence of both cervix and uterus: Secondary | ICD-10-CM

## 2022-12-12 DIAGNOSIS — Z825 Family history of asthma and other chronic lower respiratory diseases: Secondary | ICD-10-CM

## 2022-12-12 DIAGNOSIS — G2581 Restless legs syndrome: Secondary | ICD-10-CM | POA: Diagnosis present

## 2022-12-12 DIAGNOSIS — Z8261 Family history of arthritis: Secondary | ICD-10-CM

## 2022-12-12 DIAGNOSIS — E559 Vitamin D deficiency, unspecified: Secondary | ICD-10-CM | POA: Diagnosis present

## 2022-12-12 DIAGNOSIS — I214 Non-ST elevation (NSTEMI) myocardial infarction: Secondary | ICD-10-CM

## 2022-12-12 DIAGNOSIS — Z888 Allergy status to other drugs, medicaments and biological substances status: Secondary | ICD-10-CM

## 2022-12-12 DIAGNOSIS — Z808 Family history of malignant neoplasm of other organs or systems: Secondary | ICD-10-CM

## 2022-12-12 DIAGNOSIS — J45909 Unspecified asthma, uncomplicated: Secondary | ICD-10-CM | POA: Diagnosis present

## 2022-12-12 DIAGNOSIS — G4733 Obstructive sleep apnea (adult) (pediatric): Secondary | ICD-10-CM | POA: Diagnosis present

## 2022-12-12 DIAGNOSIS — Z91040 Latex allergy status: Secondary | ICD-10-CM

## 2022-12-12 DIAGNOSIS — Z79899 Other long term (current) drug therapy: Secondary | ICD-10-CM

## 2022-12-12 DIAGNOSIS — Z9049 Acquired absence of other specified parts of digestive tract: Secondary | ICD-10-CM

## 2022-12-12 DIAGNOSIS — K219 Gastro-esophageal reflux disease without esophagitis: Secondary | ICD-10-CM | POA: Diagnosis present

## 2022-12-12 DIAGNOSIS — Z6841 Body Mass Index (BMI) 40.0 and over, adult: Secondary | ICD-10-CM

## 2022-12-12 LAB — COMPREHENSIVE METABOLIC PANEL
ALT: 58 U/L — ABNORMAL HIGH (ref 0–44)
AST: 45 U/L — ABNORMAL HIGH (ref 15–41)
Albumin: 3.7 g/dL (ref 3.5–5.0)
Alkaline Phosphatase: 65 U/L (ref 38–126)
Anion gap: 11 (ref 5–15)
BUN: 13 mg/dL (ref 6–20)
CO2: 22 mmol/L (ref 22–32)
Calcium: 9.2 mg/dL (ref 8.9–10.3)
Chloride: 101 mmol/L (ref 98–111)
Creatinine, Ser: 0.63 mg/dL (ref 0.44–1.00)
GFR, Estimated: >60 mL/min (ref >60)
Glucose, Bld: 130 mg/dL — ABNORMAL HIGH (ref 70–99)
Potassium: 3.9 mmol/L (ref 3.5–5.1)
Sodium: 134 mmol/L — ABNORMAL LOW (ref 135–145)
Total Bilirubin: 0.6 mg/dL (ref 0.3–1.2)
Total Protein: 7.4 g/dL (ref 6.5–8.1)

## 2022-12-12 LAB — CBC
HCT: 41.7 % (ref 36.0–46.0)
Hemoglobin: 13 g/dL (ref 12.0–15.0)
MCH: 27.4 pg (ref 26.0–34.0)
MCHC: 31.2 g/dL (ref 30.0–36.0)
MCV: 88 fL (ref 80.0–100.0)
Platelets: 332 10*3/uL (ref 150–400)
RBC: 4.74 MIL/uL (ref 3.87–5.11)
RDW: 14.8 % (ref 11.5–15.5)
WBC: 11.6 10*3/uL — ABNORMAL HIGH (ref 4.0–10.5)
nRBC: 0 % (ref 0.0–0.2)

## 2022-12-12 LAB — LIPASE, BLOOD: Lipase: 31 U/L (ref 11–51)

## 2022-12-12 LAB — TROPONIN I (HIGH SENSITIVITY): Troponin I (High Sensitivity): 242 ng/L (ref <18)

## 2022-12-12 MED ORDER — ASPIRIN 81 MG PO CHEW
324.0000 mg | CHEWABLE_TABLET | Freq: Once | ORAL | Status: AC
  Administered 2022-12-12: 324 mg via ORAL
  Filled 2022-12-12: qty 4

## 2022-12-12 NOTE — Hospital Course (Addendum)
Taken from H&P.  Claudia Campbell is a 52 y.o. female with a history of hemiplegic migraines, asthma, GERD, Barrett's esophagus, hypothyroidism, and OSA who presents with chest pain, acute onset around 7:30 PM, now resolved.  The patient states that the pain started to the left of her sternum and then spread to her arm.  It was sharp in quality.  It started shortly after finishing a meal.  She felt nauseated and dry heaved but did not vomit.  Patient was found to have elevated troponin which peaked at 15,959. Patient was started on heparin infusion and cardiology was consulted for concern of NSTEMI.  EKG was negative for any acute ST changes.  4/17: Vital stable.  Patient underwent left heart catheterization which shows a SCAD of LAD. Heparin infusion was discontinued.  Patient was placed on aspirin and metoprolol and will stay in the hospital for next couple of days for more monitoring.  Echo pending  4/18: Vitals and lab stable.  Normal lipid profile, lipoprotein a pending. Echocardiogram with EF of 50 to 55% which is low normal.  Also shows regional wall motion abnormalities involving mid to distal anterior, anteroseptal and apical regions.  Normal diastolic function.  Cardiology also added losartan.  4/19; patient remained stable.  Able to ambulate without any chest pain or shortness of breath.  Cardiology cleared her for discharge.  She was provided referral for cardiac rehab.  She is being discharged on aspirin, statin, metoprolol and low-dose losartan.  Patient need to have a close follow-up with cardiology for further recommendations.  She was also encouraged to lost weight as it will help with the prognosis.

## 2022-12-12 NOTE — ED Provider Notes (Signed)
[facility] Provider Note    Event Date/Time   First MD Initiated Contact with Patient 12/12/22 2213     (approximate)   History   Chest Pain   HPI  Claudia Campbell is a 52 y.o. female with a history of hemiplegic migraines, asthma, GERD, Barrett's esophagus, hypothyroidism, and OSA who presents with chest pain, acute onset around 7:30 PM, now resolved.  The patient states that the pain started to the left of her sternum and then spread to her arm.  It was sharp in quality.  It started shortly after finishing a meal.  She felt nauseated and dry heaved but did not vomit up her food.  She denies any associated dizziness or shortness of breath but states that it felt painful to take a deep breath.  She denies any leg pain or swelling.  She denies any prior history of similar chest pain episodes.  She has no known cardiac history.  I reviewed the past medical records per the patient's most recent outpatient encounter was with podiatry on 4/4 for follow-up of left foot and ankle pain.  Her most recent ED visit was in 2022 for dizziness.  She had an echocardiogram in 2020 but no other recent cardiac workup.   Physical Exam   Triage Vital Signs: ED Triage Vitals  Enc Vitals Group     BP 12/12/22 2108 (!) 151/104     Pulse Rate 12/12/22 2108 89     Resp 12/12/22 2108 20     Temp 12/12/22 2108 98 F (36.7 C)     Temp Source 12/12/22 2108 Oral     SpO2 12/12/22 2108 97 %     Weight 12/12/22 2108 (!) 440 lb (199.6 kg)     Height 12/12/22 2108  (1.753 m)     Head Circumference --      Peak Flow --      Pain Score 12/12/22 2107 10     Pain Loc --      Pain Edu? --      Excl. in GC? --     Most recent vital signs: Vitals:   12/12/22 2108  BP: (!) 151/104  Pulse: 89  Resp: 20  Temp: 98 F (36.7 C)  SpO2: 97%     General: Awake, no distress.  CV:  Good peripheral perfusion.  Normal heart sounds. Resp:  Normal effort.  Lungs CTAB. Abd:  No  distention.  Other:  No significant peripheral edema.  No calf or popliteal swelling or tenderness.   ED Results / Procedures / Treatments   Labs (all labs ordered are listed, but only abnormal results are displayed) Labs Reviewed  CBC - Abnormal; Notable for the following components:      Result Value   WBC 11.6 (*)    All other components within normal limits  COMPREHENSIVE METABOLIC PANEL - Abnormal; Notable for the following components:   Sodium 134 (*)    Glucose, Bld 130 (*)    AST 45 (*)    ALT 58 (*)    All other components within normal limits  TROPONIN I (HIGH SENSITIVITY) - Abnormal; Notable for the following components:   Troponin I (High Sensitivity) 242 (*)    All other components within normal limits  LIPASE, BLOOD  TROPONIN I (HIGH SENSITIVITY)     EKG  ED ECG REPORT I, Dionne Bucy, the attending physician, personally viewed and interpreted this ECG.  Date: 12/12/2022 EKG Time: 2109 Rate: 81  Rhythm: normal sinus rhythm QRS Axis: normal Intervals: normal ST/T Wave abnormalities: normal Narrative Interpretation: no evidence of acute ischemia    RADIOLOGY  Chest x-ray: I independently viewed and interpreted the images; there is no focal consolidation or edema  PROCEDURES:  Critical Care performed: No  Procedures   MEDICATIONS ORDERED IN ED: Medications  aspirin chewable tablet 324 mg (324 mg Oral Given 12/12/22 2246)     IMPRESSION / MDM / ASSESSMENT AND PLAN / ED COURSE  I reviewed the triage vital signs and the nursing notes.  52 year old female with no prior cardiac history and other PMH as noted above presents with an episode of chest pain starting around 730 that is now resolved completely.  She denies any acute complaints at this time.  Physical exam is unremarkable for acute findings.  EKG is nonischemic.  Initial lab workup from triage reveals elevated troponin.  Electrolytes are normal.  Lipase is normal.  Differential  diagnosis includes, but is not limited to, ACS, myocarditis, demand ischemia due to other etiology.  There is no clinical evidence for PE given the lack of tachycardia or hypoxia and the resolved pain.  There is no evidence of aortic dissection or vascular etiology.  However given the elevated troponin I doubt musculoskeletal pain or other benign cause.  I have ordered aspirin and will consult cardiology.  Patient's presentation is most consistent with acute presentation with potential threat to life or bodily function.  The patient is on the cardiac monitor to evaluate for evidence of arrhythmia and/or significant heart rate changes.  ----------------------------------------- 11:03 PM on 12/12/2022 -----------------------------------------  I have paged cardiology but have not yet heard back.  I consulted Dr. Allena Katz from the hospitalist service; based on her discussion she agrees to admit the patient.   FINAL CLINICAL IMPRESSION(S) / ED DIAGNOSES   Final diagnoses:  NSTEMI (non-ST elevated myocardial infarction)     Rx / DC Orders   ED Discharge Orders     None        Note:  This document was prepared using Dragon voice recognition software and may include unintentional dictation errors.    Dionne Bucy, MD 12/12/22 2303

## 2022-12-12 NOTE — ED Triage Notes (Signed)
Pt presents to ER with c/o left side chest pain with radiation to left axillary region and down into left arm.  Pt states this pain started around 1.5 hrs ago, shortly after finishing dinner.  Pt states she has no hx of heart problems, or blood clots and does not take blood thinners.  Pt states pain is constant, and stabbing in nature.  Pt endorses some associated nausea with the chest pain.  Pt is otherwise A&O x4 and clutching at her chest in triage.

## 2022-12-12 NOTE — ED Notes (Addendum)
Pt taken to rm 5 at this time and placed on cardiac monitor. Call light within reach, family at bedside. Pt has no further needs at this time. Richard, RN made aware of pt being in rm.

## 2022-12-12 NOTE — ED Provider Triage Note (Signed)
Emergency Medicine Provider Triage Evaluation Note  Claudia Campbell , a 52 y.o. female  was evaluated in triage.  Pt complains of chest pain that started about 1 1/2 hours ago after eating. No cardiac history. Pain is sharp/stabbing. No history of DVT or PE.  Physical Exam  BP (!) 151/104   Pulse 89   Temp 98 F (36.7 C) (Oral)   Resp 20   Ht  (1.753 m)   Wt (!) 199.6 kg   SpO2 97%   BMI 64.98 kg/m  Gen:   Awake, no distress   Resp:  Normal effort  MSK:   Moves extremities without difficulty  Other:    Medical Decision Making  Medically screening exam initiated at 9:10 PM.  Appropriate orders placed.  [name] was informed that the remainder of the evaluation will be completed by another provider, this initial triage assessment does not replace that evaluation, and the importance of remaining in the ED until their evaluation is complete.  Cardiac protocol started.   [name], FNP 12/12/22 2112

## 2022-12-13 ENCOUNTER — Other Ambulatory Visit: Payer: Self-pay

## 2022-12-13 ENCOUNTER — Inpatient Hospital Stay (HOSPITAL_COMMUNITY)
Admit: 2022-12-13 | Discharge: 2022-12-13 | Disposition: A | Discharge status: Home / Self Care | Payer: BC Managed Care – PPO | Attending: Internal Medicine | Admitting: Internal Medicine

## 2022-12-13 ENCOUNTER — Encounter
Admission: EM | Disposition: A | Discharge status: Home / Self Care | Payer: Self-pay | Source: Home / Self Care | Attending: Internal Medicine

## 2022-12-13 ENCOUNTER — Encounter: Payer: Self-pay | Admitting: Internal Medicine

## 2022-12-13 DIAGNOSIS — Z825 Family history of asthma and other chronic lower respiratory diseases: Secondary | ICD-10-CM | POA: Diagnosis not present

## 2022-12-13 DIAGNOSIS — M1990 Unspecified osteoarthritis, unspecified site: Secondary | ICD-10-CM | POA: Diagnosis present

## 2022-12-13 DIAGNOSIS — R7989 Other specified abnormal findings of blood chemistry: Secondary | ICD-10-CM

## 2022-12-13 DIAGNOSIS — R079 Chest pain, unspecified: Secondary | ICD-10-CM

## 2022-12-13 DIAGNOSIS — I440 Atrioventricular block, first degree: Secondary | ICD-10-CM | POA: Diagnosis present

## 2022-12-13 DIAGNOSIS — Z6841 Body Mass Index (BMI) 40.0 and over, adult: Secondary | ICD-10-CM | POA: Diagnosis not present

## 2022-12-13 DIAGNOSIS — Z808 Family history of malignant neoplasm of other organs or systems: Secondary | ICD-10-CM | POA: Diagnosis not present

## 2022-12-13 DIAGNOSIS — I2510 Atherosclerotic heart disease of native coronary artery without angina pectoris: Secondary | ICD-10-CM

## 2022-12-13 DIAGNOSIS — Z79899 Other long term (current) drug therapy: Secondary | ICD-10-CM | POA: Diagnosis not present

## 2022-12-13 DIAGNOSIS — Z818 Family history of other mental and behavioral disorders: Secondary | ICD-10-CM | POA: Diagnosis not present

## 2022-12-13 DIAGNOSIS — Z8249 Family history of ischemic heart disease and other diseases of the circulatory system: Secondary | ICD-10-CM | POA: Diagnosis not present

## 2022-12-13 DIAGNOSIS — I214 Non-ST elevation (NSTEMI) myocardial infarction: Secondary | ICD-10-CM

## 2022-12-13 DIAGNOSIS — G2581 Restless legs syndrome: Secondary | ICD-10-CM | POA: Diagnosis present

## 2022-12-13 DIAGNOSIS — E890 Postprocedural hypothyroidism: Secondary | ICD-10-CM | POA: Diagnosis present

## 2022-12-13 DIAGNOSIS — J45909 Unspecified asthma, uncomplicated: Secondary | ICD-10-CM | POA: Diagnosis present

## 2022-12-13 DIAGNOSIS — R072 Precordial pain: Secondary | ICD-10-CM | POA: Diagnosis not present

## 2022-12-13 DIAGNOSIS — F419 Anxiety disorder, unspecified: Secondary | ICD-10-CM | POA: Diagnosis present

## 2022-12-13 DIAGNOSIS — Z8261 Family history of arthritis: Secondary | ICD-10-CM | POA: Diagnosis not present

## 2022-12-13 DIAGNOSIS — F32A Depression, unspecified: Secondary | ICD-10-CM | POA: Diagnosis present

## 2022-12-13 DIAGNOSIS — Z811 Family history of alcohol abuse and dependence: Secondary | ICD-10-CM | POA: Diagnosis not present

## 2022-12-13 DIAGNOSIS — K219 Gastro-esophageal reflux disease without esophagitis: Secondary | ICD-10-CM | POA: Diagnosis present

## 2022-12-13 DIAGNOSIS — I2542 Coronary artery dissection: Secondary | ICD-10-CM | POA: Diagnosis present

## 2022-12-13 DIAGNOSIS — Z79890 Hormone replacement therapy (postmenopausal): Secondary | ICD-10-CM | POA: Diagnosis not present

## 2022-12-13 DIAGNOSIS — G4733 Obstructive sleep apnea (adult) (pediatric): Secondary | ICD-10-CM | POA: Diagnosis present

## 2022-12-13 DIAGNOSIS — Z800 Family history of malignant neoplasm of digestive organs: Secondary | ICD-10-CM | POA: Diagnosis not present

## 2022-12-13 DIAGNOSIS — E559 Vitamin D deficiency, unspecified: Secondary | ICD-10-CM | POA: Diagnosis present

## 2022-12-13 DIAGNOSIS — K2270 Barrett's esophagus without dysplasia: Secondary | ICD-10-CM | POA: Diagnosis present

## 2022-12-13 HISTORY — PX: LEFT HEART CATH AND CORONARY ANGIOGRAPHY: CATH118249

## 2022-12-13 LAB — PROTIME-INR
INR: 1 (ref 0.8–1.2)
Prothrombin Time: 13.2 seconds (ref 11.4–15.2)

## 2022-12-13 LAB — TROPONIN I (HIGH SENSITIVITY)
Troponin I (High Sensitivity): 15959 ng/L (ref <18)
Troponin I (High Sensitivity): 14150 ng/L (ref <18)
Troponin I (High Sensitivity): 9449 ng/L (ref <18)

## 2022-12-13 LAB — D-DIMER, QUANTITATIVE: D-Dimer, Quant: 0.55 ug/mL-FEU — ABNORMAL HIGH (ref 0.00–0.50)

## 2022-12-13 LAB — HIV ANTIBODY (ROUTINE TESTING W REFLEX): HIV Screen 4th Generation wRfx: NONREACTIVE

## 2022-12-13 LAB — HEPARIN LEVEL (UNFRACTIONATED): Heparin Unfractionated: <0.1 IU/mL — ABNORMAL LOW (ref 0.30–0.70)

## 2022-12-13 LAB — APTT: aPTT: 25 seconds (ref 24–36)

## 2022-12-13 SURGERY — LEFT HEART CATH AND CORONARY ANGIOGRAPHY
Anesthesia: Moderate Sedation

## 2022-12-13 MED ORDER — VERAPAMIL HCL 2.5 MG/ML IV SOLN
INTRAVENOUS | Status: DC | PRN
  Administered 2022-12-13 (×2): 2.5 mg via INTRAVENOUS

## 2022-12-13 MED ORDER — ATORVASTATIN CALCIUM 20 MG PO TABS: 10.0000 mg | ORAL_TABLET | Freq: Every day | ORAL | Status: DC

## 2022-12-13 MED ORDER — SODIUM CHLORIDE 0.9 % IV SOLN: 250.0000 mL | INTRAVENOUS | Status: DC | PRN

## 2022-12-13 MED ORDER — ONDANSETRON HCL 4 MG/2ML IJ SOLN
4.0000 mg | Freq: Four times a day (QID) | INTRAMUSCULAR | Status: DC | PRN
  Administered 2022-12-13: 4 mg via INTRAVENOUS

## 2022-12-13 MED ORDER — MORPHINE SULFATE (PF) 2 MG/ML IV SOLN
1.0000 mg | INTRAVENOUS | Status: DC | PRN
  Administered 2022-12-13: 1 mg via INTRAVENOUS
  Filled 2022-12-13: qty 1

## 2022-12-13 MED ORDER — ASPIRIN 81 MG PO TBEC
81.0000 mg | DELAYED_RELEASE_TABLET | Freq: Every day | ORAL | Status: DC
  Administered 2022-12-13 – 2022-12-15 (×3): 81 mg via ORAL
  Filled 2022-12-13 (×3): qty 1

## 2022-12-13 MED ORDER — FUROSEMIDE 10 MG/ML IJ SOLN
20.0000 mg | Freq: Once | INTRAMUSCULAR | Status: AC
  Administered 2022-12-13: 20 mg via INTRAVENOUS

## 2022-12-13 MED ORDER — HEPARIN (PORCINE) IN NACL 1000-0.9 UT/500ML-% IV SOLN
INTRAVENOUS | Status: AC
  Filled 2022-12-13: qty 1000

## 2022-12-13 MED ORDER — ASPIRIN 81 MG PO CHEW: 81.0000 mg | CHEWABLE_TABLET | ORAL | Status: DC

## 2022-12-13 MED ORDER — LABETALOL HCL 5 MG/ML IV SOLN: 10.0000 mg | INTRAVENOUS | Status: AC | PRN

## 2022-12-13 MED ORDER — HYDRALAZINE HCL 20 MG/ML IJ SOLN: 10.0000 mg | INTRAMUSCULAR | Status: AC | PRN

## 2022-12-13 MED ORDER — HEPARIN BOLUS VIA INFUSION
4000.0000 [IU] | Freq: Once | INTRAVENOUS | Status: AC
  Administered 2022-12-13: 4000 [IU] via INTRAVENOUS
  Filled 2022-12-13: qty 4000

## 2022-12-13 MED ORDER — SERTRALINE HCL 50 MG PO TABS
150.0000 mg | ORAL_TABLET | Freq: Every day | ORAL | Status: DC
  Administered 2022-12-13 – 2022-12-15 (×3): 150 mg via ORAL
  Filled 2022-12-13 (×3): qty 3

## 2022-12-13 MED ORDER — HEPARIN SODIUM (PORCINE) 1000 UNIT/ML IJ SOLN
INTRAMUSCULAR | Status: AC
  Filled 2022-12-13: qty 10

## 2022-12-13 MED ORDER — METOPROLOL TARTRATE 25 MG PO TABS
12.5000 mg | ORAL_TABLET | Freq: Two times a day (BID) | ORAL | Status: DC
  Administered 2022-12-13 – 2022-12-14 (×3): 12.5 mg via ORAL
  Filled 2022-12-13: qty 0.5
  Filled 2022-12-13 (×2): qty 1

## 2022-12-13 MED ORDER — FENTANYL CITRATE (PF) 100 MCG/2ML IJ SOLN
INTRAMUSCULAR | Status: DC | PRN
  Administered 2022-12-13 (×2): 12.5 ug via INTRAVENOUS

## 2022-12-13 MED ORDER — SODIUM CHLORIDE 0.9% FLUSH
3.0000 mL | Freq: Two times a day (BID) | INTRAVENOUS | Status: DC
  Administered 2022-12-13 – 2022-12-15 (×4): 3 mL via INTRAVENOUS

## 2022-12-13 MED ORDER — ATORVASTATIN CALCIUM 20 MG PO TABS
10.0000 mg | ORAL_TABLET | Freq: Every day | ORAL | Status: DC
  Administered 2022-12-13: 10 mg via ORAL
  Filled 2022-12-13: qty 1

## 2022-12-13 MED ORDER — ACETAMINOPHEN 325 MG PO TABS
650.0000 mg | ORAL_TABLET | ORAL | Status: DC | PRN
  Administered 2022-12-13 – 2022-12-14 (×2): 650 mg via ORAL
  Filled 2022-12-13: qty 2

## 2022-12-13 MED ORDER — VERAPAMIL HCL 2.5 MG/ML IV SOLN
INTRAVENOUS | Status: AC
  Filled 2022-12-13: qty 2

## 2022-12-13 MED ORDER — SODIUM CHLORIDE 0.9% FLUSH: 3.0000 mL | INTRAVENOUS | Status: DC | PRN

## 2022-12-13 MED ORDER — ACETAMINOPHEN 325 MG PO TABS
ORAL_TABLET | ORAL | Status: AC
  Filled 2022-12-13: qty 2

## 2022-12-13 MED ORDER — IOHEXOL 300 MG/ML  SOLN
INTRAMUSCULAR | Status: DC | PRN
  Administered 2022-12-13: 49 mL

## 2022-12-13 MED ORDER — FUROSEMIDE 10 MG/ML IJ SOLN
INTRAMUSCULAR | Status: AC
  Filled 2022-12-13: qty 2

## 2022-12-13 MED ORDER — HEPARIN SODIUM (PORCINE) 1000 UNIT/ML IJ SOLN
INTRAMUSCULAR | Status: DC | PRN
  Administered 2022-12-13: 5000 [IU] via INTRAVENOUS

## 2022-12-13 MED ORDER — PANTOPRAZOLE SODIUM 40 MG IV SOLR
40.0000 mg | Freq: Two times a day (BID) | INTRAVENOUS | Status: DC
  Administered 2022-12-13 – 2022-12-15 (×6): 40 mg via INTRAVENOUS
  Filled 2022-12-13 (×6): qty 10

## 2022-12-13 MED ORDER — NITROGLYCERIN 1 MG/10 ML FOR IR/CATH LAB
INTRA_ARTERIAL | Status: DC | PRN
  Administered 2022-12-13: 200 ug

## 2022-12-13 MED ORDER — LEVOTHYROXINE SODIUM 88 MCG PO TABS
88.0000 ug | ORAL_TABLET | Freq: Every day | ORAL | Status: DC
  Administered 2022-12-13 – 2022-12-15 (×3): 88 ug via ORAL
  Filled 2022-12-13 (×3): qty 1

## 2022-12-13 MED ORDER — HEPARIN SODIUM (PORCINE) 5000 UNIT/ML IJ SOLN: 5000.0000 [IU] | Freq: Three times a day (TID) | INTRAMUSCULAR | Status: DC

## 2022-12-13 MED ORDER — MIDAZOLAM HCL 2 MG/2ML IJ SOLN
INTRAMUSCULAR | Status: DC | PRN
  Administered 2022-12-13 (×2): .5 mg via INTRAVENOUS

## 2022-12-13 MED ORDER — HEPARIN (PORCINE) IN NACL 2000-0.9 UNIT/L-% IV SOLN
INTRAVENOUS | Status: DC | PRN
  Administered 2022-12-13: 1000 mL

## 2022-12-13 MED ORDER — PERFLUTREN LIPID MICROSPHERE
1.0000 mL | INTRAVENOUS | Status: AC | PRN
  Administered 2022-12-13: 2 mL via INTRAVENOUS

## 2022-12-13 MED ORDER — ATORVASTATIN CALCIUM 20 MG PO TABS
40.0000 mg | ORAL_TABLET | Freq: Every day | ORAL | Status: DC
  Administered 2022-12-14 – 2022-12-15 (×2): 40 mg via ORAL
  Filled 2022-12-13 (×2): qty 2

## 2022-12-13 MED ORDER — NITROGLYCERIN 1 MG/10 ML FOR IR/CATH LAB
INTRA_ARTERIAL | Status: AC
  Filled 2022-12-13: qty 10

## 2022-12-13 MED ORDER — LEVOTHYROXINE SODIUM 50 MCG PO TABS: 150.0000 ug | ORAL_TABLET | Freq: Every day | ORAL | Status: DC

## 2022-12-13 MED ORDER — FENTANYL CITRATE (PF) 100 MCG/2ML IJ SOLN
INTRAMUSCULAR | Status: AC
  Filled 2022-12-13: qty 2

## 2022-12-13 MED ORDER — HEPARIN (PORCINE) 25000 UT/250ML-% IV SOLN
1900.0000 [IU]/h | INTRAVENOUS | Status: DC
  Administered 2022-12-13: 1600 [IU]/h via INTRAVENOUS
  Filled 2022-12-13 (×2): qty 250

## 2022-12-13 MED ORDER — FLUTICASONE PROPIONATE 50 MCG/ACT NA SUSP: 1.0000 | Freq: Every day | NASAL | Status: DC | PRN

## 2022-12-13 MED ORDER — SODIUM CHLORIDE 0.9 % WEIGHT BASED INFUSION: 1.0000 mL/kg/h | INTRAVENOUS | Status: DC

## 2022-12-13 MED ORDER — MONTELUKAST SODIUM 10 MG PO TABS
10.0000 mg | ORAL_TABLET | Freq: Every day | ORAL | Status: DC
  Administered 2022-12-13 – 2022-12-14 (×2): 10 mg via ORAL
  Filled 2022-12-13 (×2): qty 1

## 2022-12-13 MED ORDER — NITROGLYCERIN 0.4 MG SL SUBL
0.4000 mg | SUBLINGUAL_TABLET | SUBLINGUAL | Status: DC | PRN
  Administered 2022-12-13: 0.4 mg via SUBLINGUAL
  Filled 2022-12-13: qty 1

## 2022-12-13 MED ORDER — HEPARIN BOLUS VIA INFUSION
3500.0000 [IU] | Freq: Once | INTRAVENOUS | Status: AC
  Administered 2022-12-13: 3500 [IU] via INTRAVENOUS
  Filled 2022-12-13: qty 3500

## 2022-12-13 MED ORDER — SODIUM CHLORIDE 0.9 % WEIGHT BASED INFUSION
3.0000 mL/kg/h | INTRAVENOUS | Status: DC
  Administered 2022-12-13: 3 mL/kg/h via INTRAVENOUS

## 2022-12-13 MED ORDER — MIDAZOLAM HCL 2 MG/2ML IJ SOLN
INTRAMUSCULAR | Status: AC
  Filled 2022-12-13: qty 2

## 2022-12-13 MED ORDER — ONDANSETRON HCL 4 MG/2ML IJ SOLN
INTRAMUSCULAR | Status: AC
  Filled 2022-12-13: qty 2

## 2022-12-13 SURGICAL SUPPLY — 11 items
CATH 5FR JL3.5 JR4 ANG PIG MP (CATHETERS) IMPLANT
CATH INFINITI 5FR JK (CATHETERS) IMPLANT
DEVICE RAD TR BAND REGULAR (VASCULAR PRODUCTS) IMPLANT
DRAPE BRACHIAL (DRAPES) IMPLANT
GLIDESHEATH SLEND A-KIT 6F 22G (SHEATH) IMPLANT
GUIDEWIRE INQWIRE 1.5J.035X260 (WIRE) IMPLANT
INQWIRE 1.5J .035X260CM (WIRE) ×1
PACK CARDIAC CATH (CUSTOM PROCEDURE TRAY) ×1 IMPLANT
PROTECTION STATION PRESSURIZED (MISCELLANEOUS) ×1
SET ATX-X65L (MISCELLANEOUS) IMPLANT
STATION PROTECTION PRESSURIZED (MISCELLANEOUS) IMPLANT

## 2022-12-13 NOTE — Consult Note (Addendum)
Cardiology Consultation   Patient ID: Claudia Campbell MRN: [id number]; DOB: [birth year]  Admit date: 12/12/2022 Date of Consult: 12/13/2022  PCP:  [facility]   [facility] Providers Cardiologist:  Dr Toma Aran here to update MD or APP on Care Team, Refresh:1}     Patient Profile:   Claudia Campbell is a 52 y.o. female with a hx of anxiety, Barrett's esophagus, depression, gastroesophageal reflux disease, hypothyroidism status post ablation, kidney stones, migraines, restless leg syndrome, sleep apnea not currently on CPAP, vitamin D deficiency, who is being seen 12/13/2022 for the evaluation of chest pain at the request of Dr. Allena Katz.  History of Present Illness:   Claudia Campbell is a 52 year old female with previously mentioned past medical history.  She was last seen in clinic 1/6//21 and followed with Dr. Okey Dupre.  She was found previously had bradycardia and was encouraged to follow-up and have her sleep study completed as well as to continue on her weight loss journey.  When she returned at her last visit she had lost between 20 and 25 pounds and was still chest pain-free with no anginal equivalent symptoms.  She had recently followed with the EP and thought that her syncopal episode was likely vasovagal in nature.  She was to continue with weight loss and wearing a CPAP for her obstructive sleep apnea.  She presented to [facility] emergency department on 12/12/2022 with complaints of chest pain that started around 7:30 PM.  Patient states that it started on the left of her sternum and then radiated over into her left armpit.  She described it as sharp in nature and started shortly after finishing a meal.  For a brief amount of time she had felt nauseated and dry heaves but did not vomit up any of her food.  She denied any associated dizziness or shortness of breath but states that it was painful to take a deep breath.  She had no known cardiac history of her own but did there is a  family history of coronary artery disease.  She stated that originally she thought it was indigestion and had taken Pepto-Bismol.  She was given a Nitrostat 0.4 mg sublingual with resolution of her symptoms.  Initial vitals: Blood pressure 151/24, pulse of 89, respirations of 20, temperature of 98  Pertinent labs: WBCs 11.6, sodium 134, blood glucose 130, AST of 45, ALT of 58, high-sensitivity troponin of 242, 15959, 14150, D-dimer of 0.55, sodium of 134, potassium 3.9, chloride 101, CO2 22, blood glucose of 130, BUN of 13, serum creatinine 0.63, with a GFR of greater than 60  Imaging: Chest x-ray revealed enlarged cardiac silhouette with no focal consolidation  Medications administered in the emergency department: Aspirin 324 mg, Nitrostat 0.4 mg sublingual, and started on heparin infusion  Cardiology was consulted for evaluation of NSTEMI.   Past Medical History:  Diagnosis Date   Abnormal liver function test    Anxiety    Arthritis    Asthma    Barrett esophagus    Depression    First degree AV block    GERD (gastroesophageal reflux disease)    Hypothyroidism    Post ablation   Kidney stones    Migraines    PONV (postoperative nausea and vomiting)    Restless leg    Sleep apnea    NO cpap at this time, broken   Tenosynovitis    Thyroid nodule    Vitamin D deficiency     Past Surgical History:  Procedure Laterality Date   ABDOMINAL HYSTERECTOMY     BLADDER SUSPENSION  2011   BREAST RECONSTRUCTION     BUNIONECTOMY Bilateral    CAPSULOTOMY Left 01/23/2018   Procedure: CAPSULOTOMY SECOND LEFT;  Surgeon: Felecia Shelling, DPM;  Location: [facility];  Service: Podiatry;  Laterality: Left;   CHOLECYSTECTOMY     COLONOSCOPY     COLONOSCOPY WITH PROPOFOL N/A 04/25/2021   Procedure: COLONOSCOPY WITH PROPOFOL;  Surgeon: Toledo, Boykin Nearing, MD;  Location: [facility] ENDOSCOPY;  Service: Gastroenterology;  Laterality: N/A;   ESOPHAGOGASTRODUODENOSCOPY (EGD) WITH PROPOFOL N/A 01/18/2018    Procedure: ESOPHAGOGASTRODUODENOSCOPY (EGD) WITH PROPOFOL;  Surgeon: Christena Deem, MD;  Location: [facility] ENDOSCOPY;  Service: Endoscopy;  Laterality: N/A;   ESOPHAGOGASTRODUODENOSCOPY (EGD) WITH PROPOFOL N/A 04/25/2021   Procedure: ESOPHAGOGASTRODUODENOSCOPY (EGD) WITH PROPOFOL;  Surgeon: Toledo, Boykin Nearing, MD;  Location: [facility] ENDOSCOPY;  Service: Gastroenterology;  Laterality: N/A;   EXCISION MORTON'S NEUROMA     EXPLORATORY LAPAROTOMY     HAMMER TOE SURGERY Left 01/23/2018   Procedure: HAMMER TOE CORRECTION SECOND LEFT;  Surgeon: Felecia Shelling, DPM;  Location: [facility];  Service: Podiatry;  Laterality: Left;   HYSTEROSCOPY W/ ENDOMETRIAL ABLATION     KNEE ARTHROSCOPY Left    MENISECTOMY     x2   METATARSAL OSTEOTOMY Left 01/23/2018   Procedure: METATARSAL OSTEOTOMY SECOND LEFT;  Surgeon: Felecia Shelling, DPM;  Location: [facility];  Service: Podiatry;  Laterality: Left;   monitoring cranial nerves Bilateral    OSTECTOMY CALCANEUS Left    RAI ablation  2007   TENOTOMY Left    toe   TONSILLECTOMY AND ADENOIDECTOMY  10/1981   topaz Left    heel   TOTAL THYROIDECTOMY  10/12/2017   TOTAL THYROIDECTOMY     TUBAL LIGATION     UPPER GI ENDOSCOPY       Home Medications:  Prior to Admission medications   Medication Sig Start Date End Date Taking? Authorizing Provider  albuterol (VENTOLIN HFA) 108 (90 Base) MCG/ACT inhaler Inhale 2 puffs into the lungs every 6 (six) hours as needed for wheezing or shortness of breath.    [provider]  augmented betamethasone dipropionate (DIPROLENE-AF) 0.05 % cream APPLY THIN FILM TO HANDS AND FEET SITES TWICE DAILY UNTIL CLEAR 11/05/19   [provider]  cholecalciferol (VITAMIN D3) 10 MCG (400 UNIT) TABS tablet Take 400 Units by mouth.    [provider]  Clobetasol Propionate 0.05 % shampoo Apply topically 2 (two) times daily.    [provider]  esomeprazole (NEXIUM) 40 MG capsule Take 40 mg by mouth 2 (two) times daily  before a meal.  01/04/18   [provider]  fluconazole (DIFLUCAN) 150 MG tablet TAKE 1 TABLET BY MOUTH TODAY AND 1 TABLET BY MOUTH IN 3 DAYS 12/02/19   [provider]  Fluocinolone Acetonide Scalp 0.01 % OIL Apply topically.    [provider]  fluticasone (FLONASE) 50 MCG/ACT nasal spray Place 1 spray into both nostrils daily.    [provider]  ibuprofen (ADVIL) 800 MG tablet  11/19/19   [provider]  meclizine (ANTIVERT) 25 MG tablet Take 1 tablet (25 mg total) by mouth 3 (three) times daily as needed for dizziness. 10/19/20   Shaune Pollack, MD  methylPREDNISolone (MEDROL DOSEPAK) 4 MG TBPK tablet 6 day dose pack - take as directed 08/09/21   Felecia Shelling, DPM  montelukast (SINGULAIR) 10 MG tablet Take 10 mg by mouth at  bedtime.  01/04/18   [provider]  Multiple Vitamins-Minerals (MULTIVITAMIN WITH MINERALS) tablet Take 1 tablet by mouth daily.    [provider]  pramipexole (MIRAPEX) 1 MG tablet Take 2 mg by mouth at bedtime.  01/04/18   [provider]  sertraline (ZOLOFT) 100 MG tablet Take 150 mg by mouth daily.  04/27/19   [provider]  SYNTHROID 200 MCG tablet Take 200 mcg by mouth daily before breakfast.  04/27/19   [provider]  traMADol (ULTRAM) 50 MG tablet Take 50 mg by mouth every 6 (six) hours. 11/03/19   [provider]  zonisamide (ZONEGRAN) 100 MG capsule Take 100 mg by mouth daily.    [provider]    Inpatient Medications: Scheduled Meds:  [START ON 12/14/2022] aspirin  81 mg Oral Pre-Cath   aspirin EC  81 mg Oral Daily   atorvastatin  10 mg Oral Daily   levothyroxine  88 mcg Oral Q0600   montelukast  10 mg Oral QHS   pantoprazole (PROTONIX) IV  40 mg Intravenous Q12H   sertraline  150 mg Oral Daily   sodium chloride flush  3 mL Intravenous Q12H   Continuous Infusions:  sodium chloride     [START ON 12/14/2022] sodium chloride     Followed by    Melene Muller ON 12/14/2022] sodium chloride     heparin 1,900 Units/hr (12/13/22 1152)   PRN Meds: sodium chloride, acetaminophen, fluticasone, morphine injection, nitroGLYCERIN, sodium chloride flush  Allergies:    Allergies  Allergen Reactions   Aspartame Other (See Comments)    Passes out   Ciprofloxacin Hives   Ilosone [Erythromycin] Hives   Latex Swelling    Around mouth   Dicyclomine Palpitations    Social History:   Social History   Socioeconomic History   Marital status: Single    Spouse name: Not on file   Number of children: Not on file   Years of education: Not on file   Highest education level: Not on file  Occupational History   Not on file  Tobacco Use   Smoking status: Never   Smokeless tobacco: Never  Vaping Use   Vaping Use: Never used  Substance and Sexual Activity   Alcohol use: No   Drug use: No   Sexual activity: Yes    Birth control/protection: Surgical  Other Topics Concern   Not on file  Social History Narrative   Not on file   Social Determinants of Health   Financial Resource Strain: Not on file  Food Insecurity: No Food Insecurity (12/13/2022)   Hunger Vital Sign    Worried About Running Out of Food in the Last Year: Never true    Ran Out of Food in the Last Year: Never true  Transportation Needs: No Transportation Needs (12/13/2022)   PRAPARE - Administrator, Civil Service (Medical): No    Lack of Transportation (Non-Medical): No  Physical Activity: Not on file  Stress: Not on file  Social Connections: Not on file  Intimate Partner Violence: Not At Risk (12/13/2022)   Humiliation, Afraid, Rape, and Kick questionnaire    Fear of Current or Ex-Partner: No    Emotionally Abused: No    Physically Abused: No    Sexually Abused: No    Family History:    Family History  Problem Relation Age of Onset   Hypertension Father    Hyperlipidemia Father    Alcohol abuse Father    Sleep apnea  Father    Colon polyps Father     Obesity Father    CAD Maternal Grandfather    Alcohol abuse Maternal Grandfather    Skin cancer Maternal Grandfather    Pancreatic cancer Maternal Grandmother    Thyroid disease Maternal Grandmother    Rheum arthritis Mother    Skin cancer Mother    Alcohol abuse Paternal Grandfather    Colon cancer Paternal Grandfather    Goiter Paternal Grandfather    Heart disease Paternal Grandfather    Alcohol abuse Paternal Grandmother    Thyroid disease Paternal Grandmother    Asthma Sister    Depression Sister    Obesity Sister    Sleep apnea Sister    Heart disease Sister        spontaneous dissection of coronary artery   Depression Sister      ROS:  Please see the history of present illness.  Review of Systems  Cardiovascular:  Positive for chest pain and leg swelling.  Gastrointestinal:  Positive for nausea and vomiting.    All other ROS reviewed and negative.     Physical Exam/Data:   Vitals:   12/13/22 0730 12/13/22 0800 12/13/22 0830 12/13/22 1115  BP: (!) 142/74 131/85 127/80 132/80  Pulse: 65 77 69 73  Resp: (!) Temp:      TempSrc:      SpO2: 95% 100% 98% 94%  Weight:      Height:        Intake/Output Summary (Last 24 hours) at 12/13/2022 1202 Last data filed at 12/13/2022 1059 Gross per 24 hour  Intake 3 ml  Output --  Net 3 ml      12/12/2022    9:08 PM 05/03/2021    8:37 PM 04/25/2021   12:53 PM  Last 3 Weights  Weight (lbs) 440 lb 392 lb 392 lb  Weight (kg) 199.583 kg 177.81 kg 177.81 kg     Body mass index is 64.98 kg/m.  General:  Well nourished, well developed, in no acute distress HEENT: normal Neck: no JVD Vascular: No carotid bruits; Distal pulses 2+ bilaterally Cardiac:  normal S1, S2; RRR; no murmur  Lungs:  clear to auscultation bilaterally, no wheezing, rhonchi or rales, respirations are unlabored at rest on room air Abd: soft, nontender, no hepatomegaly  Ext: Trace pretibial edema Musculoskeletal:  No deformities, BUE and  BLE strength normal and equal Skin: warm and dry  Neuro:  CNs 2-12 intact, no focal abnormalities noted Psych:  Normal affect   EKG:  The EKG was personally reviewed and demonstrates: Sinus rhythm rate of 66 with first-degree AV block, left anterior fascicular block and Q waves noted in lateral leads Telemetry:  Telemetry was personally reviewed and demonstrates: Sinus rhythm first-degree AV block rates of 70-80  Relevant CV Studies:  TTE 06/10/19  1. Left ventricular ejection fraction, by visual estimation, is 65 to  70%. The left ventricle has normal function. Normal left ventricular size.  There is moderately increased left ventricular hypertrophy.   2. Global right ventricle has normal systolic function.The right  ventricular size is not well visualized. Mildly increased right  ventricular wall thickness.   3. Left atrial size was not well visualized.   4. Right atrial size was not well visualized.   5. The pericardium was not well visualized.   6. The mitral valve was not well visualized. No evidence of mitral valve  regurgitation.   7. The tricuspid valve is not  well visualized. Tricuspid valve  regurgitation was not visualized by color flow Doppler.   8. The aortic valve was not well visualized Aortic valve regurgitation  was not assessed by color flow Doppler.   9. The pulmonic valve was not well visualized. Pulmonic valve  regurgitation is not visualized by color flow Doppler.  10. The interatrial septum was not well visualized.   Laboratory Data:  High Sensitivity Troponin:   Recent Labs  Lab 12/12/22 2111 12/13/22 0051 12/13/22 0215 12/13/22 1012  TROPONINIHS 242* 15,959* 14,150* 9,449*     Chemistry Recent Labs  Lab 12/12/22 2111  NA 134*  K 3.9  CL 101  CO2 22  GLUCOSE 130*  BUN 13  CREATININE 0.63  CALCIUM 9.2  GFRNONAA >60  ANIONGAP 11    Recent Labs  Lab 12/12/22 2111  PROT 7.4  ALBUMIN 3.7  AST 45*  ALT 58*  ALKPHOS 65  BILITOT 0.6    Lipids No results for input(s): "CHOL", "TRIG", "HDL", "LABVLDL", "LDLCALC", "CHOLHDL" in the last 168 hours.  Hematology Recent Labs  Lab 12/12/22 2111  WBC 11.6*  RBC 4.74  HGB 13.0  HCT 41.7  MCV 88.0  MCH 27.4  MCHC 31.2  RDW 14.8  PLT 332   Thyroid No results for input(s): "TSH", "FREET4" in the last 168 hours.  BNPNo results for input(s): "BNP", "PROBNP" in the last 168 hours.  DDimer  Recent Labs  Lab 12/13/22 0051  DDIMER 0.55*     Radiology/Studies:  DG Chest 1 View  Result Date: 12/12/2022 CLINICAL DATA:  Chest pain EXAM: PORTABLE CHEST 1 VIEW COMPARISON:  05/03/2021. FINDINGS: Cardiac silhouette appears prominent. No pneumonia or pulmonary edema. No pneumothorax or pleural effusion. IMPRESSION: Enlarged cardiac silhouette.  No focal consolidation. Electronically Signed   By: Layla Maw M.D.   On: 12/12/2022 21:28     Assessment and Plan:   NSTEMI -Patient presented to the emergency department with an encounter for chest pain -High-sensitivity troponin trended to 242, 15959, 14150, 9449 -Patient continued with some associated chest discomfort this morning  -She is to remain n.p.o. -She has been scheduled for left heart catheterization this afternoon -Echocardiogram is ordered and pending with further recommendations to follow -Continue with cardiac monitoring -Continue on heparin infusion -Continue aspirin and statin therapy -EKG as needed for pain or changes  Obstructive sleep apnea -Continue CPAP nightly  Hypothyroidism -Continue levothyroxine -Patient notes decrease in daily medication within the past 2 weeks is the only change that she has undergone -Management per IM  Shared Decision Making/Informed Consent The risks [stroke (1 in 1000), death (1 in 1000), kidney failure [usually temporary] (1 in 500), bleeding (1 in 200), allergic reaction [possibly serious] (1 in 200)], benefits (diagnostic support and management of coronary artery  disease) and alternatives of a cardiac catheterization were discussed in detail with Claudia Campbell and she is willing to proceed.    Risk Assessment/Risk Scores:     TIMI Risk Score for Unstable Angina or Non-ST Elevation MI:   The patient's TIMI risk score is 4, which indicates a 20% risk of all cause mortality, new or recurrent myocardial infarction or need for urgent revascularization in the next 14 days.          For questions or updates, please contact [facility] Please consult [email or web address] for contact info under    Signed, Allea Kassner, NP  12/13/2022 12:02 PM

## 2022-12-13 NOTE — Interval H&P Note (Signed)
History and Physical Interval Note:  12/13/2022 2:05 PM  Claudia Campbell  has presented today for surgery, with the diagnosis of non st elevated myocardial infarction.  The various methods of treatment have been discussed with the patient and family. After consideration of risks, benefits and other options for treatment, the patient has consented to  Procedure(s): LEFT HEART CATH AND CORONARY ANGIOGRAPHY (N/A) as a surgical intervention.  The patient's history has been reviewed, patient examined, no change in status, stable for surgery.  I have reviewed the patient's chart and labs.  Questions were answered to the patient's satisfaction.    Cath Lab Visit (complete for each Cath Lab visit)  Clinical Evaluation Leading to the Procedure:   ACS: Yes.    Non-ACS:  N/A  Bacilio Abascal

## 2022-12-13 NOTE — Assessment & Plan Note (Signed)
Estimated body mass index is 65.39 kg/m as calculated from the following:   Height as of this encounter: 5' 9.02" (1.753 m).   Weight as of this encounter: 200.9 kg.   -This will complicate overall prognosis

## 2022-12-13 NOTE — Brief Op Note (Signed)
BRIEF CARDIAC CATHETERIZATION NOTE  DATE: 12/13/2022  TIME: 2:40 PM  PATIENT:  Claudia Campbell  52 y.o. female  PRE-OPERATIVE DIAGNOSIS:  Non st elevated myocardial infarction  POST-OPERATIVE DIAGNOSIS:  SCAD  PROCEDURE:  Procedure(s): LEFT HEART CATH AND CORONARY ANGIOGRAPHY (N/A)  SURGEON:  Surgeon(s) and Role:    * Kaetlin Bullen, MD - Primary  FINDINGS: Irregular tapering of distal LAD consistent with SCAD.  No significant disease involving LCx or RCA. Mildly reduced LVEF with ? apical WMA. Moderately elevated LVEDP.  RECOMMENDATIONS: Medical therapy; will add metoprolol. Gentle diuresis. Avoid heparin; continue aspirin. Obtain echo.  [name], MD [facility]

## 2022-12-13 NOTE — Progress Notes (Signed)
  Progress Note   Patient: Claudia Campbell ZOX:[id number] DOB: 1971-06-26 DOA: 12/12/2022     0 DOS: the patient was seen and examined on 12/13/2022   Brief hospital course: Taken from H&P.  Claudia Campbell is a 52 y.o. female with a history of hemiplegic migraines, asthma, GERD, Barrett's esophagus, hypothyroidism, and OSA who presents with chest pain, acute onset around 7:30 PM, now resolved.  The patient states that the pain started to the left of her sternum and then spread to her arm.  It was sharp in quality.  It started shortly after finishing a meal.  She felt nauseated and dry heaved but did not vomit.  Patient was found to have elevated troponin which peaked at 15,959. Patient was started on heparin infusion and cardiology was consulted for concern of NSTEMI.  EKG was negative for any acute ST changes.  4/17: Vital stable.  Patient underwent left heart catheterization which shows a SCAD of LAD. Heparin infusion was discontinued.  Patient was placed on aspirin and metoprolol and will stay in the hospital for next couple of days for more monitoring.  Echo pending          Assessment and Plan: * Spontaneous dissection of coronary artery Patient presented with chest pain and rising troponin.  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 Heparin was discontinued -Cardiology is placing her on metoprolol and aspirin -Continue to monitor -Echocardiogram ordered by cardiology-pending    Postablative hypothyroidism Cont with levothyroxine.  Barrett's esophagus IV PPI therapy. Patient has extensive GI history with Barrett's esophagus.  GERD (gastroesophageal reflux disease) IV PPI.   Morbid obesity Estimated body mass index is 65.39 kg/m as calculated from the following:   Height as of this encounter: 5' 9.02" (1.753 m).   Weight as of this encounter: 200.9 kg.   -This will complicate overall  prognosis  Obstructive sleep apnea CPAP per home settings.    Subjective: Patient was seen and examined today.  No chest pain at that time.  Per patient her sister has SCAD exactly 5 years ago on the same date, she was really suspecting that she has the same.  Physical Exam: Vitals:   12/13/22 1449 12/13/22 1500 12/13/22 1515 12/13/22 1530  BP: 136/82 (!) 144/86 (!) 147/89 (!) 147/83  Pulse: 75 84 83 82  Resp: Temp:      TempSrc:      SpO2: 100% 100% 99% 96%  Weight:      Height:       General.  Morbidly obese lady, in no acute distress. Pulmonary.  Lungs clear bilaterally, normal respiratory effort. CV.  Regular rate and rhythm, no JVD, rub or murmur. Abdomen.  Soft, nontender, nondistended, BS positive. CNS.  Alert and oriented .  No focal neurologic deficit. Extremities.  Trace LE edema, no cyanosis, pulses intact and symmetrical. Psychiatry.  Judgment and insight appears normal.   Data Reviewed: Prior data reviewed  Family Communication: Discussed with sister at bedside  Disposition: Status is: Inpatient Remains inpatient appropriate because: Severity of illness  Planned Discharge Destination: Home  Time spent: 50 minutes  This record has been created using Conservation officer, historic buildings. Errors have been sought and corrected,but may not always be located. Such creation errors do not reflect on the standard of care.   Author: Arnetha Courser, MD 12/13/2022 3:37 PM  For on call review [email or web address].

## 2022-12-13 NOTE — Assessment & Plan Note (Signed)
IV PPI therapy. Patient has extensive GI history with Barrett's esophagus.

## 2022-12-13 NOTE — Assessment & Plan Note (Signed)
Cont with levothyroxine.

## 2022-12-13 NOTE — Assessment & Plan Note (Signed)
CPAP per home settings.  

## 2022-12-13 NOTE — Assessment & Plan Note (Signed)
IV PPI. 

## 2022-12-13 NOTE — Progress Notes (Signed)
ANTICOAGULATION CONSULT NOTE  Pharmacy Consult for heparin infusion Indication: ACS/STEMI  Allergies  Allergen Reactions   Aspartame Other (See Comments)    Passes out   Ciprofloxacin Hives   Ilosone [Erythromycin] Hives   Latex Swelling    Around mouth   Dicyclomine Palpitations    Patient Measurements: Height:  (175.3 cm) Weight: (!) 199.6 kg (440 lb) IBW/kg (Calculated) : 66.2 Heparin Dosing Weight: 117.8 kg  Vital Signs: Temp: 98 F (36.7 C) (04/16 2108) Temp Source: Oral (04/16 2108) BP: 151/104 (04/16 2108) Pulse Rate: 89 (04/16 2108)  Labs: Recent Labs    12/12/22 2111 12/13/22 0051  HGB 13.0  --   HCT 41.7  --   PLT 332  --   CREATININE 0.63  --   TROPONINIHS 242* 15,959*    Estimated Creatinine Clearance: 160.6 mL/min (by C-G formula based on SCr of 0.63 mg/dL).   Medical History: Past Medical History:  Diagnosis Date   Abnormal liver function test    Anxiety    Arthritis    Asthma    Barrett esophagus    Depression    First degree AV block    GERD (gastroesophageal reflux disease)    Hypothyroidism    Post ablation   Kidney stones    Migraines    PONV (postoperative nausea and vomiting)    Restless leg    Sleep apnea    NO cpap at this time, broken   Tenosynovitis    Thyroid nodule    Vitamin D deficiency     Assessment: Pt is a 52 yo female presenting to ED with CP, found with elevated troponin I, trending up.  Goal of Therapy:  Heparin level 0.3-0.7 units/ml Monitor platelets by anticoagulation protocol: Yes   Plan:  Bolus 4000 units x 1 Start heparin infusion at 1600 units/hr Will check HL in 6 hr after start of infusion CBC daily while on heparin  Otelia Sergeant, PharmD, [facility] 12/13/2022 1:46 AM

## 2022-12-13 NOTE — Progress Notes (Signed)
ANTICOAGULATION CONSULT NOTE  Pharmacy Consult for heparin infusion Indication: ACS/STEMI  Allergies  Allergen Reactions   Aspartame Other (See Comments)    Passes out   Ciprofloxacin Hives   Ilosone [Erythromycin] Hives   Latex Swelling    Around mouth   Dicyclomine Palpitations    Patient Measurements: Height:  (175.3 cm) Weight: (!) 199.6 kg (440 lb) IBW/kg (Calculated) : 66.2 Heparin Dosing Weight: 117.8 kg  Vital Signs: Temp: 98.1 F (36.7 C) (04/17 0400) Temp Source: Oral (04/17 0400) BP: 132/80 (04/17 1115) Pulse Rate: 73 (04/17 1115)  Labs: Recent Labs    12/12/22 2111 12/13/22 0051 12/13/22 0215 12/13/22 0235 12/13/22 1012  HGB 13.0  --   --   --   --   HCT 41.7  --   --   --   --   PLT 332  --   --   --   --   APTT  --   --   --  25  --   LABPROT  --   --   --  13.2  --   INR  --   --   --  1.0  --   HEPARINUNFRC  --   --   --   --  <0.10*  CREATININE 0.63  --   --   --   --   TROPONINIHS 242* 15,959* 14,150*  --  9,449*     Estimated Creatinine Clearance: 160.6 mL/min (by C-G formula based on SCr of 0.63 mg/dL).   Medical History: Past Medical History:  Diagnosis Date   Abnormal liver function test    Anxiety    Arthritis    Asthma    Barrett esophagus    Depression    First degree AV block    GERD (gastroesophageal reflux disease)    Hypothyroidism    Post ablation   Kidney stones    Migraines    PONV (postoperative nausea and vomiting)    Restless leg    Sleep apnea    NO cpap at this time, broken   Tenosynovitis    Thyroid nodule    Vitamin D deficiency     Assessment: Pt is a 52 yo female presenting to ED with CP, found with elevated troponin I, trending up.  Goal of Therapy:  Heparin level 0.3-0.7 units/ml Monitor platelets by anticoagulation protocol: Yes   4/17 1012 HL < 0.10 at 1600 un/hr  Plan:  Bolus 3500 units x 1 Increase  heparin infusion to 1900 units/hr Will check HL in 6 hr after rate change CBC  daily while on heparin  Adalid Beckmann Rodriguez-Guzman PharmD, BCPS 12/13/2022 11:34 AM

## 2022-12-13 NOTE — H&P (View-Only) (Signed)
 Cardiology Consultation   Patient ID: Claudia Campbell MRN: [id number]; DOB: 02/06/1971  Admit date: 12/12/2022 Date of Consult: 12/13/2022  PCP:  [place]   [facility] Providers Cardiologist:  Dr End Click here to update MD or APP on Care Team, Refresh:1}     Patient Profile:   Claudia Campbell is a 52 y.o. female with a hx of anxiety, Barrett's esophagus, depression, gastroesophageal reflux disease, hypothyroidism status post ablation, kidney stones, migraines, restless leg syndrome, sleep apnea not currently on CPAP, vitamin D deficiency, who is being seen 12/13/2022 for the evaluation of chest pain at the request of Dr. Patel.  History of Present Illness:   Claudia Campbell is a 52-year-old female with previously mentioned past medical history.  She was last seen in clinic 1/6//21 and followed with Dr. End.  She was found previously had bradycardia and was encouraged to follow-up and have her sleep study completed as well as to continue on her weight loss journey.  When she returned at her last visit she had lost between 20 and 25 pounds and was still chest pain-free with no anginal equivalent symptoms.  She had recently followed with the EP and thought that her syncopal episode was likely vasovagal in nature.  She was to continue with weight loss and wearing a CPAP for her obstructive sleep apnea.  She presented to [facility] emergency department on 12/12/2022 with complaints of chest pain that started around 7:30 PM.  Patient states that it started on the left of her sternum and then radiated over into her left armpit.  She described it as sharp in nature and started shortly after finishing a meal.  For a brief amount of time she had felt nauseated and dry heaves but did not vomit up any of her food.  She denied any associated dizziness or shortness of breath but states that it was painful to take a deep breath.  She had no known cardiac history of her own but did there is a  family history of coronary artery disease.  She stated that originally she thought it was indigestion and had taken Pepto-Bismol.  She was given a Nitrostat 0.4 mg sublingual with resolution of her symptoms.  Initial vitals: Blood pressure 151/24, pulse of 89, respirations of 20, temperature of 98  Pertinent labs: WBCs 11.6, sodium 134, blood glucose 130, AST of 45, ALT of 58, high-sensitivity troponin of 242, 15959, 14150, D-dimer of 0.55, sodium of 134, potassium 3.9, chloride 101, CO2 22, blood glucose of 130, BUN of 13, serum creatinine 0.63, with a GFR of greater than 60  Imaging: Chest x-ray revealed enlarged cardiac silhouette with no focal consolidation  Medications administered in the emergency department: Aspirin 324 mg, Nitrostat 0.4 mg sublingual, and started on heparin infusion  Cardiology was consulted for evaluation of NSTEMI.   Past Medical History:  Diagnosis Date   Abnormal liver function test    Anxiety    Arthritis    Asthma    Barrett esophagus    Depression    First degree AV block    GERD (gastroesophageal reflux disease)    Hypothyroidism    Post ablation   Kidney stones    Migraines    PONV (postoperative nausea and vomiting)    Restless leg    Sleep apnea    NO cpap at this time, broken   Tenosynovitis    Thyroid nodule    Vitamin D deficiency     Past Surgical History:    Procedure Laterality Date   ABDOMINAL HYSTERECTOMY     BLADDER SUSPENSION  2011   BREAST RECONSTRUCTION     BUNIONECTOMY Bilateral    CAPSULOTOMY Left 01/23/2018   Procedure: CAPSULOTOMY SECOND LEFT;  Surgeon: Evans, Brent M, DPM;  Location: [facility];  Service: Podiatry;  Laterality: Left;   CHOLECYSTECTOMY     COLONOSCOPY     COLONOSCOPY WITH PROPOFOL N/A 04/25/2021   Procedure: COLONOSCOPY WITH PROPOFOL;  Surgeon: Toledo, Teodoro K, MD;  Location: [facility];  Service: Gastroenterology;  Laterality: N/A;   ESOPHAGOGASTRODUODENOSCOPY (EGD) WITH PROPOFOL N/A 01/18/2018    Procedure: ESOPHAGOGASTRODUODENOSCOPY (EGD) WITH PROPOFOL;  Surgeon: Skulskie, Martin U, MD;  Location: [facility];  Service: Endoscopy;  Laterality: N/A;   ESOPHAGOGASTRODUODENOSCOPY (EGD) WITH PROPOFOL N/A 04/25/2021   Procedure: ESOPHAGOGASTRODUODENOSCOPY (EGD) WITH PROPOFOL;  Surgeon: Toledo, Teodoro K, MD;  Location: [facility];  Service: Gastroenterology;  Laterality: N/A;   EXCISION MORTON'S NEUROMA     EXPLORATORY LAPAROTOMY     HAMMER TOE SURGERY Left 01/23/2018   Procedure: HAMMER TOE CORRECTION SECOND LEFT;  Surgeon: Evans, Brent M, DPM;  Location: [facility];  Service: Podiatry;  Laterality: Left;   HYSTEROSCOPY W/ ENDOMETRIAL ABLATION     KNEE ARTHROSCOPY Left    MENISECTOMY     x2   METATARSAL OSTEOTOMY Left 01/23/2018   Procedure: METATARSAL OSTEOTOMY SECOND LEFT;  Surgeon: Evans, Brent M, DPM;  Location: [facility];  Service: Podiatry;  Laterality: Left;   monitoring cranial nerves Bilateral    OSTECTOMY CALCANEUS Left    RAI ablation  2007   TENOTOMY Left    toe   TONSILLECTOMY AND ADENOIDECTOMY  10/1981   topaz Left    heel   TOTAL THYROIDECTOMY  10/12/2017   TOTAL THYROIDECTOMY     TUBAL LIGATION     UPPER GI ENDOSCOPY       Home Medications:  Prior to Admission medications   Medication Sig Start Date End Date Taking? Authorizing Provider  albuterol (VENTOLIN HFA) 108 (90 Base) MCG/ACT inhaler Inhale 2 puffs into the lungs every 6 (six) hours as needed for wheezing or shortness of breath.    [provider]  augmented betamethasone dipropionate (DIPROLENE-AF) 0.05 % cream APPLY THIN FILM TO HANDS AND FEET SITES TWICE DAILY UNTIL CLEAR 11/05/19   [provider]  cholecalciferol (VITAMIN D3) 10 MCG (400 UNIT) TABS tablet Take 400 Units by mouth.    [provider]  Clobetasol Propionate 0.05 % shampoo Apply topically 2 (two) times daily.    [provider]  esomeprazole (NEXIUM) 40 MG capsule Take 40 mg by mouth 2 (two) times daily  before a meal.  01/04/18   [provider]  fluconazole (DIFLUCAN) 150 MG tablet TAKE 1 TABLET BY MOUTH TODAY AND 1 TABLET BY MOUTH IN 3 DAYS 12/02/19   [provider]  Fluocinolone Acetonide Scalp 0.01 % OIL Apply topically.    [provider]  fluticasone (FLONASE) 50 MCG/ACT nasal spray Place 1 spray into both nostrils daily.    [provider]  ibuprofen (ADVIL) 800 MG tablet  11/19/19   [provider]  meclizine (ANTIVERT) 25 MG tablet Take 1 tablet (25 mg total) by mouth 3 (three) times daily as needed for dizziness. 10/19/20   Isaacs, Cameron, MD  methylPREDNISolone (MEDROL DOSEPAK) 4 MG TBPK tablet 6 day dose pack - take as directed 08/09/21   Evans, Brent M, DPM  montelukast (SINGULAIR) 10 MG tablet Take 10 mg by mouth at   bedtime.  01/04/18   [provider]  Multiple Vitamins-Minerals (MULTIVITAMIN WITH MINERALS) tablet Take 1 tablet by mouth daily.    [provider]  pramipexole (MIRAPEX) 1 MG tablet Take 2 mg by mouth at bedtime.  01/04/18   [provider]  sertraline (ZOLOFT) 100 MG tablet Take 150 mg by mouth daily.  04/27/19   [provider]  SYNTHROID 200 MCG tablet Take 200 mcg by mouth daily before breakfast.  04/27/19   [provider]  traMADol (ULTRAM) 50 MG tablet Take 50 mg by mouth every 6 (six) hours. 11/03/19   [provider]  zonisamide (ZONEGRAN) 100 MG capsule Take 100 mg by mouth daily.    [provider]    Inpatient Medications: Scheduled Meds:  [START ON 12/14/2022] aspirin  81 mg Oral Pre-Cath   aspirin EC  81 mg Oral Daily   atorvastatin  10 mg Oral Daily   levothyroxine  88 mcg Oral Q0600   montelukast  10 mg Oral QHS   pantoprazole (PROTONIX) IV  40 mg Intravenous Q12H   sertraline  150 mg Oral Daily   sodium chloride flush  3 mL Intravenous Q12H   Continuous Infusions:  sodium chloride     [START ON 12/14/2022] sodium chloride     Followed by    [START ON 12/14/2022] sodium chloride     heparin 1,900 Units/hr (12/13/22 1152)   PRN Meds: sodium chloride, acetaminophen, fluticasone, morphine injection, nitroGLYCERIN, sodium chloride flush  Allergies:    Allergies  Allergen Reactions   Aspartame Other (See Comments)    Passes out   Ciprofloxacin Hives   Ilosone [Erythromycin] Hives   Latex Swelling    Around mouth   Dicyclomine Palpitations    Social History:   Social History   Socioeconomic History   Marital status: Single    Spouse name: Not on file   Number of children: Not on file   Years of education: Not on file   Highest education level: Not on file  Occupational History   Not on file  Tobacco Use   Smoking status: Never   Smokeless tobacco: Never  Vaping Use   Vaping Use: Never used  Substance and Sexual Activity   Alcohol use: No   Drug use: No   Sexual activity: Yes    Birth control/protection: Surgical  Other Topics Concern   Not on file  Social History Narrative   Not on file   Social Determinants of Health   Financial Resource Strain: Not on file  Food Insecurity: No Food Insecurity (12/13/2022)   Hunger Vital Sign    Worried About Running Out of Food in the Last Year: Never true    Ran Out of Food in the Last Year: Never true  Transportation Needs: No Transportation Needs (12/13/2022)   PRAPARE - Transportation    Lack of Transportation (Medical): No    Lack of Transportation (Non-Medical): No  Physical Activity: Not on file  Stress: Not on file  Social Connections: Not on file  Intimate Partner Violence: Not At Risk (12/13/2022)   Humiliation, Afraid, Rape, and Kick questionnaire    Fear of Current or Ex-Partner: No    Emotionally Abused: No    Physically Abused: No    Sexually Abused: No    Family History:    Family History  Problem Relation Age of Onset   Hypertension Father    Hyperlipidemia Father    Alcohol abuse Father    Sleep apnea   Father    Colon polyps Father     Obesity Father    CAD Maternal Grandfather    Alcohol abuse Maternal Grandfather    Skin cancer Maternal Grandfather    Pancreatic cancer Maternal Grandmother    Thyroid disease Maternal Grandmother    Rheum arthritis Mother    Skin cancer Mother    Alcohol abuse Paternal Grandfather    Colon cancer Paternal Grandfather    Goiter Paternal Grandfather    Heart disease Paternal Grandfather    Alcohol abuse Paternal Grandmother    Thyroid disease Paternal Grandmother    Asthma Sister    Depression Sister    Obesity Sister    Sleep apnea Sister    Heart disease Sister        spontaneous dissection of coronary artery   Depression Sister      ROS:  Please see the history of present illness.  Review of Systems  Cardiovascular:  Positive for chest pain and leg swelling.  Gastrointestinal:  Positive for nausea and vomiting.    All other ROS reviewed and negative.     Physical Exam/Data:   Vitals:   12/13/22 0730 12/13/22 0800 12/13/22 0830 12/13/22 1115  BP: (!) 142/74 131/85 127/80 132/80  Pulse: 65 77 69 73  Resp: (!) 21 15 11 18  Temp:      TempSrc:      SpO2: 95% 100% 98% 94%  Weight:      Height:        Intake/Output Summary (Last 24 hours) at 12/13/2022 1202 Last data filed at 12/13/2022 1059 Gross per 24 hour  Intake 3 ml  Output --  Net 3 ml      12/12/2022    9:08 PM 05/03/2021    8:37 PM 04/25/2021   12:53 PM  Last 3 Weights  Weight (lbs) 440 lb 392 lb 392 lb  Weight (kg) 199.583 kg 177.81 kg 177.81 kg     Body mass index is 64.98 kg/m.  General:  Well nourished, well developed, in no acute distress HEENT: normal Neck: no JVD Vascular: No carotid bruits; Distal pulses 2+ bilaterally Cardiac:  normal S1, S2; RRR; no murmur  Lungs:  clear to auscultation bilaterally, no wheezing, rhonchi or rales, respirations are unlabored at rest on room air Abd: soft, nontender, no hepatomegaly  Ext: Trace pretibial edema Musculoskeletal:  No deformities, BUE and  BLE strength normal and equal Skin: warm and dry  Neuro:  CNs 2-12 intact, no focal abnormalities noted Psych:  Normal affect   EKG:  The EKG was personally reviewed and demonstrates: Sinus rhythm rate of 66 with first-degree AV block, left anterior fascicular block and Q waves noted in lateral leads Telemetry:  Telemetry was personally reviewed and demonstrates: Sinus rhythm first-degree AV block rates of 70-80  Relevant CV Studies:  TTE 06/10/19  1. Left ventricular ejection fraction, by visual estimation, is 65 to  70%. The left ventricle has normal function. Normal left ventricular size.  There is moderately increased left ventricular hypertrophy.   2. Global right ventricle has normal systolic function.The right  ventricular size is not well visualized. Mildly increased right  ventricular wall thickness.   3. Left atrial size was not well visualized.   4. Right atrial size was not well visualized.   5. The pericardium was not well visualized.   6. The mitral valve was not well visualized. No evidence of mitral valve  regurgitation.   7. The tricuspid valve is not   well visualized. Tricuspid valve  regurgitation was not visualized by color flow Doppler.   8. The aortic valve was not well visualized Aortic valve regurgitation  was not assessed by color flow Doppler.   9. The pulmonic valve was not well visualized. Pulmonic valve  regurgitation is not visualized by color flow Doppler.  10. The interatrial septum was not well visualized.   Laboratory Data:  High Sensitivity Troponin:   Recent Labs  Lab 12/12/22 2111 12/13/22 0051 12/13/22 0215 12/13/22 1012  TROPONINIHS 242* 15,959* 14,150* 9,449*     Chemistry Recent Labs  Lab 12/12/22 2111  NA 134*  K 3.9  CL 101  CO2 22  GLUCOSE 130*  BUN 13  CREATININE 0.63  CALCIUM 9.2  GFRNONAA >60  ANIONGAP 11    Recent Labs  Lab 12/12/22 2111  PROT 7.4  ALBUMIN 3.7  AST 45*  ALT 58*  ALKPHOS 65  BILITOT 0.6    Lipids No results for input(s): "CHOL", "TRIG", "HDL", "LABVLDL", "LDLCALC", "CHOLHDL" in the last 168 hours.  Hematology Recent Labs  Lab 12/12/22 2111  WBC 11.6*  RBC 4.74  HGB 13.0  HCT 41.7  MCV 88.0  MCH 27.4  MCHC 31.2  RDW 14.8  PLT 332   Thyroid No results for input(s): "TSH", "FREET4" in the last 168 hours.  BNPNo results for input(s): "BNP", "PROBNP" in the last 168 hours.  DDimer  Recent Labs  Lab 12/13/22 0051  DDIMER 0.55*     Radiology/Studies:  DG Chest 1 View  Result Date: 12/12/2022 CLINICAL DATA:  Chest pain EXAM: PORTABLE CHEST 1 VIEW COMPARISON:  05/03/2021. FINDINGS: Cardiac silhouette appears prominent. No pneumonia or pulmonary edema. No pneumothorax or pleural effusion. IMPRESSION: Enlarged cardiac silhouette.  No focal consolidation. Electronically Signed   By: Joshua  Pleasure M.D.   On: 12/12/2022 21:28     Assessment and Plan:   NSTEMI -Patient presented to the emergency department with an encounter for chest pain -High-sensitivity troponin trended to 242, 15959, 14150, 9449 -Patient continued with some associated chest discomfort this morning  -She is to remain n.p.o. -She has been scheduled for left heart catheterization this afternoon -Echocardiogram is ordered and pending with further recommendations to follow -Continue with cardiac monitoring -Continue on heparin infusion -Continue aspirin and statin therapy -EKG as needed for pain or changes  Obstructive sleep apnea -Continue CPAP nightly  Hypothyroidism -Continue levothyroxine -Patient notes decrease in daily medication within the past 2 weeks is the only change that she has undergone -Management per IM  Shared Decision Making/Informed Consent The risks [stroke (1 in 1000), death (1 in 1000), kidney failure [usually temporary] (1 in 500), bleeding (1 in 200), allergic reaction [possibly serious] (1 in 200)], benefits (diagnostic support and management of coronary artery  disease) and alternatives of a cardiac catheterization were discussed in detail with Ms. Weirauch and she is willing to proceed.    Risk Assessment/Risk Scores:     TIMI Risk Score for Unstable Angina or Non-ST Elevation MI:   The patient's TIMI risk score is 4, which indicates a 20% risk of all cause mortality, new or recurrent myocardial infarction or need for urgent revascularization in the next 14 days.          For questions or updates, please contact [facility] Please consult [email or web address] for contact info under    Signed, Marquail Bradwell, NP  12/13/2022 12:02 PM  

## 2022-12-13 NOTE — H&P (Signed)
History and Physical     Patient: Claudia Campbell ZOX:[id number] DOB: 12-10-1970 DOA: 12/12/2022 DOS: the patient was seen and examined on 12/13/2022 PCP: [facility]   Patient coming from: Home  Chief Complaint: Chest pain.   HISTORY OF PRESENT ILLNESS: Claudia Campbell is an 52 y.o. female seen in ed for chest pain that started today.  Duration:3 hours Frequency:constant. Location:left sided. Quality:sharp Rate:10/10 Radiation:left axilla. Aggravating:deep breath  Alleviating:Nothing Associated factors:nausea.  Last time she has chest pain she had an echo that showed: 2 d echo 05/2019: IMPRESSIONS   1. Left ventricular ejection fraction, by visual estimation, is 65 to  70%. The left ventricle has normal function. Normal left ventricular size.  There is moderately increased left ventricular hypertrophy.   2. Global right ventricle has normal systolic function.The right  ventricular size is not well visualized. Mildly increased right  ventricular wall thickness.   3. Left atrial size was not well visualized.   4. Right atrial size was not well visualized.   5. The pericardium was not well visualized.   6. The mitral valve was not well visualized. No evidence of mitral valve  regurgitation.   7. The tricuspid valve is not well visualized. Tricuspid valve  regurgitation was not visualized by color flow Doppler.   8. The aortic valve was not well visualized Aortic valve regurgitation  was not assessed by color flow Doppler.   9. The pulmonic valve was not well visualized. Pulmonic valve  regurgitation is not visualized by color flow Doppler.  10. The interatrial septum was not well visualized.   Past Medical History:  Diagnosis Date   Abnormal liver function test    Anxiety    Arthritis    Asthma    Barrett esophagus    Depression    First degree AV block    GERD (gastroesophageal reflux disease)    Hypothyroidism    Post ablation   Kidney stones     Migraines    PONV (postoperative nausea and vomiting)    Restless leg    Sleep apnea    NO cpap at this time, broken   Tenosynovitis    Thyroid nodule    Vitamin D deficiency    Review of Systems  Respiratory:  Positive for shortness of breath.   Cardiovascular:  Positive for chest pain.  Gastrointestinal:  Positive for nausea.  All other systems reviewed and are negative.  Allergies  Allergen Reactions   Aspartame Other (See Comments)    Passes out   Ciprofloxacin Hives   Ilosone [Erythromycin] Hives   Latex Swelling    Around mouth   Dicyclomine Palpitations   Past Surgical History:  Procedure Laterality Date   ABDOMINAL HYSTERECTOMY     BLADDER SUSPENSION  2011   BREAST RECONSTRUCTION     BUNIONECTOMY Bilateral    CAPSULOTOMY Left 01/23/2018   Procedure: CAPSULOTOMY SECOND LEFT;  Surgeon: Felecia Shelling, DPM;  Location: [facility];  Service: Podiatry;  Laterality: Left;   CHOLECYSTECTOMY     COLONOSCOPY     COLONOSCOPY WITH PROPOFOL N/A 04/25/2021   Procedure: COLONOSCOPY WITH PROPOFOL;  Surgeon: Toledo, Boykin Nearing, MD;  Location: [facility];  Service: Gastroenterology;  Laterality: N/A;   ESOPHAGOGASTRODUODENOSCOPY (EGD) WITH PROPOFOL N/A 01/18/2018   Procedure: ESOPHAGOGASTRODUODENOSCOPY (EGD) WITH PROPOFOL;  Surgeon: Christena Deem, MD;  Location: [facility] ENDOSCOPY;  Service: Endoscopy;  Laterality: N/A;   ESOPHAGOGASTRODUODENOSCOPY (EGD) WITH PROPOFOL N/A 04/25/2021   Procedure: ESOPHAGOGASTRODUODENOSCOPY (EGD) WITH PROPOFOL;  Surgeon:  Toledo, Boykin Nearing, MD;  Location: [facility] ENDOSCOPY;  Service: Gastroenterology;  Laterality: N/A;   EXCISION MORTON'S NEUROMA     EXPLORATORY LAPAROTOMY     HAMMER TOE SURGERY Left 01/23/2018   Procedure: HAMMER TOE CORRECTION SECOND LEFT;  Surgeon: Felecia Shelling, DPM;  Location: [facility];  Service: Podiatry;  Laterality: Left;   HYSTEROSCOPY W/ ENDOMETRIAL ABLATION     KNEE ARTHROSCOPY Left    MENISECTOMY     x2   METATARSAL  OSTEOTOMY Left 01/23/2018   Procedure: METATARSAL OSTEOTOMY SECOND LEFT;  Surgeon: Felecia Shelling, DPM;  Location: [facility];  Service: Podiatry;  Laterality: Left;   monitoring cranial nerves Bilateral    OSTECTOMY CALCANEUS Left    RAI ablation  2007   TENOTOMY Left    toe   TONSILLECTOMY AND ADENOIDECTOMY  10/1981   topaz Left    heel   TOTAL THYROIDECTOMY  10/12/2017   TOTAL THYROIDECTOMY     TUBAL LIGATION     UPPER GI ENDOSCOPY     MEDICATIONS: Prior to Admission medications   Medication Sig Start Date End Date Taking? Authorizing Provider  albuterol (VENTOLIN HFA) 108 (90 Base) MCG/ACT inhaler Inhale 2 puffs into the lungs every 6 (six) hours as needed for wheezing or shortness of breath.    [provider]  augmented betamethasone dipropionate (DIPROLENE-AF) 0.05 % cream APPLY THIN FILM TO HANDS AND FEET SITES TWICE DAILY UNTIL CLEAR 11/05/19   [provider]  cholecalciferol (VITAMIN D3) 10 MCG (400 UNIT) TABS tablet Take 400 Units by mouth.    [provider]  Clobetasol Propionate 0.05 % shampoo Apply topically 2 (two) times daily.    [provider]  esomeprazole (NEXIUM) 40 MG capsule Take 40 mg by mouth 2 (two) times daily before a meal.  01/04/18   [provider]  fluconazole (DIFLUCAN) 150 MG tablet TAKE 1 TABLET BY MOUTH TODAY AND 1 TABLET BY MOUTH IN 3 DAYS 12/02/19   [provider]  Fluocinolone Acetonide Scalp 0.01 % OIL Apply topically.    [provider]  fluticasone (FLONASE) 50 MCG/ACT nasal spray Place 1 spray into both nostrils daily.    [provider]  ibuprofen (ADVIL) 800 MG tablet  11/19/19   [provider]  meclizine (ANTIVERT) 25 MG tablet Take 1 tablet (25 mg total) by mouth 3 (three) times daily as needed for dizziness. 10/19/20   Shaune Pollack, MD  methylPREDNISolone (MEDROL DOSEPAK) 4 MG TBPK tablet 6 day dose pack - take as directed 08/09/21   Felecia Shelling, DPM   montelukast (SINGULAIR) 10 MG tablet Take 10 mg by mouth at bedtime.  01/04/18   [provider]  Multiple Vitamins-Minerals (MULTIVITAMIN WITH MINERALS) tablet Take 1 tablet by mouth daily.    [provider]  pramipexole (MIRAPEX) 1 MG tablet Take 2 mg by mouth at bedtime.  01/04/18   [provider]  sertraline (ZOLOFT) 100 MG tablet Take 150 mg by mouth daily.  04/27/19   [provider]  SYNTHROID 200 MCG tablet Take 200 mcg by mouth daily before breakfast.  04/27/19   [provider]  traMADol (ULTRAM) 50 MG tablet Take 50 mg by mouth every 6 (six) hours. 11/03/19   [provider]  zonisamide (ZONEGRAN) 100 MG capsule Take 100 mg by mouth daily.    [provider]   ED Course: Pt in Ed alert awake oriented afebrile O2 sat 97% on room patient has a  history. Vitals:   12/12/22 2108  BP: (!) 151/104  Pulse: 89  Resp: 20  Temp: 98 F (36.7 C)  TempSrc: Oral  SpO2: 97%  Weight: (!) 199.6 kg  Height: 5\' 9"  (1.753 m)  EKG today shows: Sinus rhythm at 81 with a prolonged PR interval, 220.  No intake/output data recorded. SpO2: 97 % Blood work in ed shows: Hyponatremia of 134 glucose of 130 patient is prediabetic last A1c of 6.9, AST 45, ALT 58, troponin of 242, white count of 11.6, glucose of 130, 2 secondary showing enlarged cardiac silhouette with no focal consolidation. Results for orders placed or performed during the hospital encounter of 12/12/22 (from the past 72 hour(s))  CBC     Status: Abnormal   Collection Time: 12/12/22  9:11 PM  Result Value Ref Range   WBC 11.6 (H) 4.0 - 10.5 K/uL   RBC 4.74 3.87 - 5.11 MIL/uL   Hemoglobin 13.0 12.0 - 15.0 g/dL   HCT 16.1 09.6 - 04.5 %   MCV 88.0 80.0 - 100.0 fL   MCH 27.4 26.0 - 34.0 pg   MCHC 31.2 30.0 - 36.0 g/dL   RDW 40.9 81.1 - 91.4 %   Platelets 332 150 - 400 K/uL   nRBC 0.0 0.0 - 0.2 %    Comment: Performed at [facility], [address]  [place], [place] [address]  Comprehensive metabolic panel     Status: Abnormal   Collection Time: 12/12/22  9:11 PM  Result Value Ref Range   Sodium 134 (L) 135 - 145 mmol/L   Potassium 3.9 3.5 - 5.1 mmol/L   Chloride 101 98 - 111 mmol/L   CO2 22 22 - 32 mmol/L   Glucose, Bld 130 (H) 70 - 99 mg/dL    Comment: Glucose reference range applies only to samples taken after fasting for at least 8 hours.   BUN 13 6 - 20 mg/dL   Creatinine, Ser 6.21 0.44 - 1.00 mg/dL   Calcium 9.2 8.9 - 30.8 mg/dL   Total Protein 7.4 6.5 - 8.1 g/dL   Albumin 3.7 3.5 - 5.0 g/dL   AST 45 (H) 15 - 41 U/L   ALT 58 (H) 0 - 44 U/L   Alkaline Phosphatase 65 38 - 126 U/L   Total Bilirubin 0.6 0.3 - 1.2 mg/dL   GFR, Estimated >65 >78 mL/min    Comment: (NOTE) Calculated using the CKD-EPI Creatinine Equation (2021)    Anion gap 11 5 - 15    Comment: Performed at [facility], [address] [place], [place] [address]  Troponin I (High Sensitivity)     Status: Abnormal   Collection Time: 12/12/22  9:11 PM  Result Value Ref Range   Troponin I (High Sensitivity) 242 (HH) <18 ng/L    Comment: CRITICAL RESULT CALLED TO, READ BACK BY AND VERIFIED WITH ASHLEY ORSUTO@2203  12/12/22 RH (NOTE) Elevated high sensitivity troponin I (hsTnI) values and significant  changes across serial measurements may suggest ACS but many other  chronic and acute conditions are known to elevate hsTnI results.  Refer to the "Links" section for chest pain algorithms and additional  guidance. Performed at [facility], [address] [place], [place] [address]   Lipase, blood     Status: None   Collection Time: 12/12/22  9:11 PM  Result Value Ref Range   Lipase 31 11 - 51 U/L    Comment: Performed at [facility], [address]  [place], [place] [address]    Lab Results  Component Value Date   CREATININE 0.63 12/12/2022   CREATININE 0.79 05/03/2021   CREATININE 0.62 10/19/2020      Latest Ref Rng & Units  12/12/2022    9:11 PM 05/03/2021    8:40 PM 10/19/2020   12:51 PM  CMP  Glucose 70 - 99 mg/dL 604  540  981   BUN 6 - 20 mg/dL Creatinine 0.44 - 1.00 mg/dL 1.91  4.78  2.95   Sodium 135 - 145 mmol/L 134  135  136   Potassium 3.5 - 5.1 mmol/L 3.9  4.0  3.8   Chloride 98 - 111 mmol/L 101  102  102   CO2 22 - 32 mmol/L Calcium 8.9 - 10.3 mg/dL 9.2  9.3  9.6   Total Protein 6.5 - 8.1 g/dL 7.4     Total Bilirubin 0.3 - 1.2 mg/dL 0.6     Alkaline Phos 38 - 126 U/L 65     AST 15 - 41 U/L 45     ALT 0 - 44 U/L 58      Unresulted Labs (From admission, onward)     Start     Ordered   12/13/22 0100  HIV Antibody (routine testing w rflx)  Once,   R        12/13/22 0100   12/12/22 2346  D-dimer, quantitative  ONCE - STAT,   STAT        12/12/22 2346   12/12/22 2314  D-dimer, quantitative  ONCE - STAT,   STAT        12/12/22 2313           Pt has received : Orders Placed This Encounter  Procedures   DG Chest 1 View    Standing Status:   Standing    Number of Occurrences:   1    Order Specific Question:   Symptom/Reason for Exam    Answer:   Chest pain [744799]   CBC    Standing Status:   Standing    Number of Occurrences:   1   Comprehensive metabolic panel    Standing Status:   Standing    Number of Occurrences:   1   Lipase, blood    Standing Status:   Standing    Number of Occurrences:   1   D-dimer, quantitative    Standing Status:   Standing    Number of Occurrences:   1   D-dimer, quantitative    Standing Status:   Standing    Number of Occurrences:   1   HIV Antibody (routine testing w rflx)    Standing Status:   Standing    Number of Occurrences:   1   Diet NPO time specified    Standing Status:   Standing    Number of Occurrences:   1   Bed rest with bathroom privileges    Standing Status:   Standing    Number of Occurrences:   1   Apply Angina, Rule Out Myocardial Infarction Care Plan    Standing Status:   Standing    Number of  Occurrences:   1   Vital signs with O2 sat q4 hours x 24 hours, then q shift    Standing Status:   Standing    Number of Occurrences:   1   Cardiac Monitoring Continuous x  24 hours Indications for use: Other; other indications for use: chest pain    Standing Status:   Standing    Number of Occurrences:   1    Order Specific Question:   Indications for use:    Answer:   Other    Order Specific Question:   other indications for use:    Answer:   chest pain   RN may order Cardiology PRN Orders utilizing "Cardiology PRN medications" (through manage orders) for the following patient needs:    indigestion (Maalox), cough (Robitussin DM), constipation (MOM), diarrhea (Imodium), or minor skin irritation (Hydrocortisone Cream)    Standing Status:   Standing    Number of Occurrences:   [phone]   Full code    Standing Status:   Standing    Number of Occurrences:   1    Order Specific Question:   By:    Answer:   Other   Consult to hospitalist  [phone]    Standing Status:   Standing    Number of Occurrences:   1    Order Specific Question:   Place call to:    Answer:   Hospitalist    Order Specific Question:   Reason for Consult    Answer:   Admit    Order Specific Question:   Diagnosis/Clinical Info for Consult:    Answer:   NSTEMI   Inpatient consult to Cardiology [facility] only - Select consulting group: [facility]; Consult Timeframe: ROUTINE - requires response within 24 hours; Reason for Consult? chest pain/ NSTEMI    Standing Status:   Standing    Number of Occurrences:   1    Order Specific Question:   [facility] only - Select consulting group    Answer:   [facility]    Order Specific Question:   Consult Timeframe    Answer:   ROUTINE - requires response within 24 hours    Order Specific Question:   Reason for Consult?    Answer:   chest pain/ NSTEMI   EKG 12-Lead    Standing Status:   Standing    Number of Occurrences:   1   EKG 12-Lead    Standing Status:   Standing    Number of Occurrences:   1    EKG 12-Lead (at 6am)    Standing Status:   Standing    Number of Occurrences:   1    Order Specific Question:   Notes    Answer:   chest pain   Place in observation (patient's expected length of stay will be less than 2 midnights)    Standing Status:   Standing    Number of Occurrences:   1    Order Specific Question:   Hospital Area    Answer:   [facility] [[id number]]    Order Specific Question:   Level of Care    Answer:   Telemetry Cardiac [103]    Order Specific Question:   Covid Evaluation    Answer:   Asymptomatic - no recent exposure (last 10 days) testing not required    Order Specific Question:   Diagnosis    Answer:   Chest pain [562130]    Order Specific Question:   Admitting Physician    Answer:   Darrold Junker    Order Specific Question:   Attending Physician    Answer:   Darrold Junker    Meds ordered this encounter  Medications   aspirin chewable tablet 324 mg   levothyroxine (SYNTHROID) tablet 200 mcg   sertraline (ZOLOFT) tablet 150 mg   montelukast (SINGULAIR) tablet 10 mg   fluticasone (FLONASE) 50 MCG/ACT nasal spray 1 spray   acetaminophen (TYLENOL) tablet 650 mg   heparin injection 5,000 Units   aspirin EC tablet 81 mg   atorvastatin (LIPITOR) tablet 10 mg   nitroGLYCERIN (NITROSTAT) SL tablet 0.4 mg   morphine (PF) 2 MG/ML injection 1 mg    Admission Imaging : DG Chest 1 View  Result Date: 12/12/2022 CLINICAL DATA:  Chest pain EXAM: PORTABLE CHEST 1 VIEW COMPARISON:  05/03/2021. FINDINGS: Cardiac silhouette appears prominent. No pneumonia or pulmonary edema. No pneumothorax or pleural effusion. IMPRESSION: Enlarged cardiac silhouette.  No focal consolidation. Electronically Signed   By: Layla Maw M.D.   On: 12/12/2022 21:28   Physical Examination: Vitals:   12/12/22 2108  BP: (!) 151/104  Pulse: 89  Temp: 98 F (36.7 C)  Resp: 20  Height: 5\' 9"  (1.753 m)  Weight: (!) 199.6 kg  SpO2: 97%  TempSrc:  Oral  BMI (Calculated): 64.95   Physical Exam Vitals and nursing note reviewed.  Constitutional:      General: She is not in acute distress.    Appearance: Normal appearance. She is not ill-appearing, toxic-appearing or diaphoretic.  HENT:     Head: Normocephalic and atraumatic.     Right Ear: Hearing and external ear normal.     Left Ear: Hearing and external ear normal.     Nose: Nose normal. No nasal deformity.     Mouth/Throat:     Lips: Pink.     Mouth: Mucous membranes are moist.     Tongue: No lesions.     Pharynx: Oropharynx is clear.  Eyes:     Extraocular Movements: Extraocular movements intact.     Pupils: Pupils are equal, round, and reactive to light.  Neck:     Vascular: No carotid bruit.  Cardiovascular:     Rate and Rhythm: Normal rate and regular rhythm.     Pulses: Normal pulses.     Heart sounds: Normal heart sounds.  Pulmonary:     Effort: Pulmonary effort is normal.     Breath sounds: Normal breath sounds.  Abdominal:     General: Bowel sounds are normal. There is no distension.     Palpations: Abdomen is soft. There is no mass.     Tenderness: There is no abdominal tenderness. There is no guarding.     Hernia: No hernia is present.  Musculoskeletal:     Right lower leg: No edema.     Left lower leg: No edema.  Skin:    General: Skin is warm.  Neurological:     General: No focal deficit present.     Mental Status: She is alert and oriented to person, place, and time.     Cranial Nerves: Cranial nerves 2-12 are intact.     Motor: Motor function is intact.  Psychiatric:        Attention and Perception: Attention normal.        Mood and Affect: Mood normal.        Speech: Speech normal.        Behavior: Behavior normal. Behavior is cooperative.        Cognition and Memory: Cognition normal.     Assessment and Plan: * Chest pain Patient presenting with midsternal chest pain and troponin of 242 EKG negative  for any st changes.  PRN  NTG. Cardiology Consult.  Prn morphine.  BP control.  Beta blocker if room with BP.   Postablative hypothyroidism Cont with levothyroxine.  Obstructive sleep apnea CPAP per home settings.   GERD (gastroesophageal reflux disease) IV PPI.   Barrett's esophagus IV PPI therapy. Patient has extensive GI history with Barrett's esophagus.    DVT prophylaxis:  Heparin.    Code Status:  Full code.       12/12/2022    9:09 PM  Advanced Directives  Does Patient Have a Medical Advance Directive? No    Family Communication:  Sister at bedside.   Emergency Contact: Contact Information     Name Relation Home Work Mobile   Claudia Campbell Sister [phone]        Disposition Plan:  Home.  Consults: Cardiology; Dr.Gollan.   Admission status: Observation.    Unit / Expected LOS: Med tele/  2 days.   Gertha Calkin MD [facility]  6 PM- 2 AM. [phone]( Pager )  For questions regarding this patient please use [email or web address] to contact the current [facility] MD.   Bonita Quin may also call [phone] to contact current Assigned [facility] Attending/Consulting MD for this patient.

## 2022-12-13 NOTE — Assessment & Plan Note (Addendum)
Patient presented with chest pain and rising troponin.  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 Heparin was discontinued -Cardiology is placing her on metoprolol and aspirin -Continue to monitor -Echocardiogram ordered by cardiology-pending

## 2022-12-13 NOTE — Progress Notes (Signed)
Received note from nurse about elevated TNI.    Latest Reference Range & Units 12/12/22 21:11 12/13/22 00:51  Troponin I (High Sensitivity) <18 ng/L 242 (HH) 15,959 (HH)  (HH): Data is critically high   Current Facility-Administered Medications:    acetaminophen (TYLENOL) tablet 650 mg, 650 mg, Oral, Q4H PRN, Gertha Calkin, MD   aspirin EC tablet 81 mg, 81 mg, Oral, Daily, Allena Katz, Eliezer Mccoy, MD   atorvastatin (LIPITOR) tablet 10 mg, 10 mg, Oral, Daily, Aaban Griep, Eliezer Mccoy, MD   fluticasone (FLONASE) 50 MCG/ACT nasal spray 1 spray, 1 spray, Each Nare, Daily PRN, Gertha Calkin, MD   heparin ADULT infusion 100 units/mL (25000 units/224mL), 1,600 Units/hr, Intravenous, Continuous, Belue, Nathan S, RPH   heparin bolus via infusion 4,000 Units, 4,000 Units, Intravenous, Once, Otelia Sergeant, RPH   levothyroxine (SYNTHROID) tablet 150 mcg, 150 mcg, Oral, Q0600, Gertha Calkin, MD   montelukast (SINGULAIR) tablet 10 mg, 10 mg, Oral, QHS, Zamyia Gowell V, MD   morphine (PF) 2 MG/ML injection 1 mg, 1 mg, Intravenous, Q3H PRN, Gertha Calkin, MD   nitroGLYCERIN (NITROSTAT) SL tablet 0.4 mg, 0.4 mg, Sublingual, Q5 min PRN, Irena Cords V, MD   pantoprazole (PROTONIX) injection 40 mg, 40 mg, Intravenous, Q12H, Allena Katz, Nikesh Teschner V, MD   sertraline (ZOLOFT) tablet 150 mg, 150 mg, Oral, Daily, Gertha Calkin, MD

## 2022-12-14 ENCOUNTER — Encounter: Payer: Self-pay | Admitting: Internal Medicine

## 2022-12-14 ENCOUNTER — Other Ambulatory Visit: Payer: Self-pay | Admitting: *Deleted

## 2022-12-14 DIAGNOSIS — E890 Postprocedural hypothyroidism: Secondary | ICD-10-CM | POA: Diagnosis not present

## 2022-12-14 DIAGNOSIS — I214 Non-ST elevation (NSTEMI) myocardial infarction: Principal | ICD-10-CM

## 2022-12-14 DIAGNOSIS — I2542 Coronary artery dissection: Secondary | ICD-10-CM

## 2022-12-14 DIAGNOSIS — K2270 Barrett's esophagus without dysplasia: Secondary | ICD-10-CM | POA: Diagnosis not present

## 2022-12-14 DIAGNOSIS — G4733 Obstructive sleep apnea (adult) (pediatric): Secondary | ICD-10-CM

## 2022-12-14 DIAGNOSIS — K219 Gastro-esophageal reflux disease without esophagitis: Secondary | ICD-10-CM

## 2022-12-14 LAB — CBC
HCT: 39 % (ref 36.0–46.0)
Hemoglobin: 12.1 g/dL (ref 12.0–15.0)
MCH: 27.3 pg (ref 26.0–34.0)
MCHC: 31 g/dL (ref 30.0–36.0)
MCV: 87.8 fL (ref 80.0–100.0)
Platelets: 284 10*3/uL (ref 150–400)
RBC: 4.44 MIL/uL (ref 3.87–5.11)
RDW: 15.1 % (ref 11.5–15.5)
WBC: 7.3 10*3/uL (ref 4.0–10.5)
nRBC: 0 % (ref 0.0–0.2)

## 2022-12-14 LAB — BASIC METABOLIC PANEL
Anion gap: 9 (ref 5–15)
BUN: 9 mg/dL (ref 6–20)
CO2: 25 mmol/L (ref 22–32)
Calcium: 8.5 mg/dL — ABNORMAL LOW (ref 8.9–10.3)
Chloride: 104 mmol/L (ref 98–111)
Creatinine, Ser: 0.71 mg/dL (ref 0.44–1.00)
GFR, Estimated: >60 mL/min (ref >60)
Glucose, Bld: 111 mg/dL — ABNORMAL HIGH (ref 70–99)
Potassium: 3.6 mmol/L (ref 3.5–5.1)
Sodium: 138 mmol/L (ref 135–145)

## 2022-12-14 LAB — ECHOCARDIOGRAM COMPLETE
Area-P 1/2: 3.56 cm2
Height: 69.016 in
S' Lateral: 3.7 cm
Weight: 7083.2 oz

## 2022-12-14 LAB — LIPID PANEL
Cholesterol: 140 mg/dL (ref 0–200)
HDL: 43 mg/dL (ref >40)
LDL Cholesterol: 76 mg/dL (ref 0–99)
Total CHOL/HDL Ratio: 3.3 RATIO
Triglycerides: 103 mg/dL (ref <150)
VLDL: 21 mg/dL (ref 0–40)

## 2022-12-14 MED ORDER — METOPROLOL SUCCINATE ER 25 MG PO TB24
25.0000 mg | ORAL_TABLET | Freq: Every evening | ORAL | Status: DC
  Administered 2022-12-14: 25 mg via ORAL
  Filled 2022-12-14: qty 1

## 2022-12-14 MED ORDER — LOSARTAN POTASSIUM 25 MG PO TABS
12.5000 mg | ORAL_TABLET | Freq: Every day | ORAL | Status: DC
  Administered 2022-12-14 – 2022-12-15 (×2): 12.5 mg via ORAL
  Filled 2022-12-14 (×2): qty 1

## 2022-12-14 NOTE — Progress Notes (Signed)
Rounding Note    Patient Name: Claudia Campbell Date of Encounter: 12/14/2022  [facility] Cardiologist: New consult seen by Dr. Okey Dupre  Subjective   Patient is in no acute distress. She denies chest pain, shortness of breath, palpitations or dizziness. She does not have any tenderness to cath site.  Inpatient Medications    Scheduled Meds:  aspirin EC  81 mg Oral Daily   atorvastatin  40 mg Oral Daily   levothyroxine  88 mcg Oral Q0600   metoprolol tartrate  12.5 mg Oral BID   montelukast  10 mg Oral QHS   pantoprazole (PROTONIX) IV  40 mg Intravenous Q12H   sertraline  150 mg Oral Daily   sodium chloride flush  3 mL Intravenous Q12H   sodium chloride flush  3 mL Intravenous Q12H   Continuous Infusions:  sodium chloride     PRN Meds: sodium chloride, acetaminophen, fluticasone, morphine injection, nitroGLYCERIN, ondansetron (ZOFRAN) IV, sodium chloride flush   Vital Signs    Vitals:   12/13/22 1813 12/13/22 1929 12/14/22 0000 12/14/22 0343  BP: (!) 116/59 119/68 112/74 124/70  Pulse: 85 77 67 66  Resp: Temp: 98.7 F (37.1 C) 97.6 F (36.4 C) 97.8 F (36.6 C) 97.6 F (36.4 C)  TempSrc:      SpO2: 94% 95% 98% 99%  Weight: (!) 200.8 kg     Height:        Intake/Output Summary (Last 24 hours) at 12/14/2022 0753 Last data filed at 12/13/2022 1059 Gross per 24 hour  Intake 3 ml  Output --  Net 3 ml      12/13/2022    6:13 PM 12/13/2022    1:19 PM 12/12/2022    9:08 PM  Last 3 Weights  Weight (lbs) 442 lb 11.2 oz 443 lb 440 lb  Weight (kg) 200.807 kg 200.943 kg 199.583 kg      Telemetry    SR with first degree 2.2 sinus pause at 05:30 - Personally Reviewed  ECG    No new tracings  - Personally Reviewed  Physical Exam   GEN: No acute distress.   Neck: No JVD Cardiac: RRR, no murmurs, rubs, or gallops, cath site is nontender to palpation and no evidence of hematoma, warmth or erythema  Respiratory: Clear to auscultation  bilaterally. GI: Soft, nontender, non-distended  MS: trace edema; No deformity. Neuro:  Nonfocal  Psych: Normal affect   Labs    High Sensitivity Troponin:   Recent Labs  Lab 12/12/22 2111 12/13/22 0051 12/13/22 0215 12/13/22 1012  TROPONINIHS 242* 15,959* 14,150* 9,449*     Chemistry Recent Labs  Lab 12/12/22 2111 12/14/22 0428  NA 134* 138  K 3.9 3.6  CL 101 104  CO2 22 25  GLUCOSE 130* 111*  BUN 13 9  CREATININE 0.63 0.71  CALCIUM 9.2 8.5*  PROT 7.4  --   ALBUMIN 3.7  --   AST 45*  --   ALT 58*  --   ALKPHOS 65  --   BILITOT 0.6  --   GFRNONAA >60 >60  ANIONGAP 11 9    Lipids  Recent Labs  Lab 12/14/22 0428  CHOL 140  TRIG 103  HDL 43  LDLCALC 76  CHOLHDL 3.3    Hematology Recent Labs  Lab 12/12/22 2111 12/14/22 0428  WBC 11.6* 7.3  RBC 4.74 4.44  HGB 13.0 12.1  HCT 41.7 39.0  MCV 88.0 87.8  MCH 27.4 27.3  MCHC 31.2 31.0  RDW 14.8 15.1  PLT 332 284   Thyroid No results for input(s): "TSH", "FREET4" in the last 168 hours.  BNPNo results for input(s): "BNP", "PROBNP" in the last 168 hours.  DDimer  Recent Labs  Lab 12/13/22 0051  DDIMER 0.55*     Radiology    CARDIAC CATHETERIZATION  Result Date: 12/13/2022 Conclusions: Single-vessel coronary artery disease with irregular tapering distal LAD and small D4 branch with up to 70% stenosis.  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  No significant disease observed in the LMCA, LCx, or RCA. Probably mildly reduced left ventricular systolic function with apical hypokinesis, though suboptimal LV opacification limits evaluation (LVEF 45-50%). Moderately elevated left ventricular filling pressure (LVEDP 25-30 mmHg). Recommendations: Continue aspirin 81 mg daily.  Defer addition of P2Y12 and other antithrombotic agents in the setting of spontaneous coronary artery dissection. Follow-up echocardiogram. Gentle diuresis and escalation of goal-directed medical therapy as  tolerated. Recommend observation at least 1-2 more nights to ensure patient does not have recurrent chest pain to suggest propagation/recurrence of SCAD. [name], MD [facility]  DG Chest 1 View  Result Date: 12/12/2022 CLINICAL DATA:  Chest pain EXAM: PORTABLE CHEST 1 VIEW COMPARISON:  05/03/2021. FINDINGS: Cardiac silhouette appears prominent. No pneumonia or pulmonary edema. No pneumothorax or pleural effusion. IMPRESSION: Enlarged cardiac silhouette.  No focal consolidation. Electronically Signed   By: Layla Maw M.D.   On: 12/12/2022 21:28    Cardiac Studies   TTE 06/10/19  1. Left ventricular ejection fraction, by visual estimation, is 65 to  70%. The left ventricle has normal function. Normal left ventricular size.  There is moderately increased left ventricular hypertrophy.   2. Global right ventricle has normal systolic function.The right  ventricular size is not well visualized. Mildly increased right  ventricular wall thickness.   3. Left atrial size was not well visualized.   4. Right atrial size was not well visualized.   5. The pericardium was not well visualized.   6. The mitral valve was not well visualized. No evidence of mitral valve  regurgitation.   7. The tricuspid valve is not well visualized. Tricuspid valve  regurgitation was not visualized by color flow Doppler.   8. The aortic valve was not well visualized Aortic valve regurgitation  was not assessed by color flow Doppler.   9. The pulmonic valve was not well visualized. Pulmonic valve  regurgitation is not visualized by color flow Doppler.  10. The interatrial septum was not well visualized.   Left Heart Catheterization 12/13/2022 Single-vessel coronary artery disease with irregular tapering distal LAD and small D4 branch with up to 70% stenosis.  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  No significant disease observed in the LMCA, LCx, or RCA. Probably mildly  reduced left ventricular systolic function with apical hypokinesis, though suboptimal LV opacification limits evaluation (LVEF 45-50%). Moderately elevated left ventricular filling pressure (LVEDP 25-30 mmHg).  Patient Profile     52 y.o. female with a hx of anxiety, Barrett's esophagus, depression, gastroesophageal reflux disease, hypothyroidism status post ablation, kidney stones, migraines, restless leg syndrome, sleep apnea not currently on CPAP, vitamin D deficiency, who is being seen for the evaluation of chest pain.   Assessment & Plan    SCAD - Single-vessel coronary artery disease with irregular tapering distal LAD and small D4 branch with up to 70% stenosis.  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  No significant disease observed in the  LMCA, LCx, or RCA. - Follow up echo ordered showed moderate wall movement abnormality  - Discontinued heparin  - Continue aspiring 81 mg daily, Nitro PRN  - Continue Atorvastatin 10 mg daily, Metoprolol Succinate 12.5 mg daily  - Start Losartan 50mg  daily  - Follow up with cardiologist for long term care   Obstructive Sleep Apnea - Continue CPAP nightly   Hypothyroidism  - Continue Levothyroxine - Treatment per IM recommendations   For questions or updates, please contact [facility] Please consult [email or web address] for contact info under     Signed, Nathanial Millman, Student-PA  12/14/2022, 7:53 AM    Attending Note Patient seen and examined, agree with detailed note above,   Patient presentation and plan discussed on rounds.    EKG lab work, chest x-ray, echocardiogram reviewed independently by myself  Seen comfortably overnight, denies chest pain Recent events reviewed, severe chest pain December 12, 2022 relieved with nitro in the emergency room Cardiac catheterization performed yesterday with findings consistent with SCAD mid to distal LAD, no intervention performed Reports sister had similar  event 5 years ago to the day Peak troponin 15,959 now trending downward, 9449 yesterday  Echocardiogram reviewed, small region focal wall motion abnormality distal anteroseptal, anterior wall, apical region EF 50 to 55%  On examination : alert oriented, no JVD, lungs clear to auscultation bilaterally, heart sounds regular normal S1-S2 no murmurs appreciated, abdomen soft nontender no significant lower extremity edema.  Musculoskeletal exam with good range of motion, neurologic exam grossly nonfocal     Latest Ref Rng & Units 12/14/2022    4:28 AM 12/12/2022    9:11 PM 05/03/2021    8:40 PM  BMP  Glucose 70 - 99 mg/dL 098  119  147   BUN 6 - 20 mg/dL 9  13  15    Creatinine 0.44 - 1.00 mg/dL 8.29  5.62  1.30   Sodium 135 - 145 mmol/L 138  134  135   Potassium 3.5 - 5.1 mmol/L 3.6  3.9  4.0   Chloride 98 - 111 mmol/L 104  101  102   CO2 22 - 32 mmol/L 25  22  24    Calcium 8.9 - 10.3 mg/dL 8.5  9.2  9.3       Latest Ref Rng & Units 12/14/2022    4:28 AM 12/12/2022    9:11 PM 05/03/2021    8:40 PM  CBC  WBC 4.0 - 10.5 K/uL 7.3  11.6  10.5   Hemoglobin 12.0 - 15.0 g/dL 86.5  78.4  69.6   Hematocrit 36.0 - 46.0 % 39.0  41.7  41.0   Platelets 150 - 400 K/uL 284  332  350    A/p: Non-STEMI In the setting of SCAD, distal LAD region on catheterization performed yesterday Ejection fraction 50 to 55%, small region focal wall motion abnormality mid to distal anteroseptal, anterior wall, apical region  transition to metoprolol succinate 25 daily, aspirin with statin, losartan 12.5 daily -Given significant climbing troponin, recommended additional 24-hour observation for cardiac arrhythmia and medication titration  Sleep apnea Continue CPAP  Hypothyroidism On levothyroxine  Morbid obesity We have encouraged continued exercise, careful diet management in an effort to lose weight. Consider newer agents such as Ozempic/Mounjaro   Greater than 50% was spent in counseling and coordination of  care with patient Total encounter time 50 minutes or more   Signed: Dossie Arbour  M.D., Ph.D. [facility]

## 2022-12-14 NOTE — Assessment & Plan Note (Signed)
Patient presented with chest pain and rising troponin.  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 Heparin was discontinued. Echocardiogram with low normal EF and regional wall motion abnormalities. -Continue metoprolol and aspirin -Cardiology also added losartan -Continue to monitor

## 2022-12-14 NOTE — TOC Progression Note (Signed)
Transition of Care [facility]) - Progression Note    Patient Details  Name: Claudia Campbell MRN: [id number] Date of Birth: 30-Mar-1971  Transition of Care [facility]) CM/SW Contact  Truddie Hidden, RN Phone Number: 12/14/2022, 2:45 PM  Clinical Narrative:     Transition of Care [facility]) Screening Note   Patient Details  Name: Claudia Campbell Date of Birth: September 16, 1970   Transition of Care [facility]) CM/SW Contact:    Truddie Hidden, RN Phone Number: 12/14/2022, 2:45 PM    Transition of Care Department [facility]) has reviewed patient and no TOC needs have been identified at this time. We will continue to monitor patient advancement through interdisciplinary progression rounds. If new patient transition needs arise, please place a TOC consult.           Expected Discharge Plan and Services                                               Social Determinants of Health (SDOH) Interventions SDOH Screenings   Food Insecurity: No Food Insecurity (12/13/2022)  Housing: Low Risk  (12/13/2022)  Transportation Needs: No Transportation Needs (12/13/2022)  Utilities: Not At Risk (12/13/2022)  Tobacco Use: Low Risk  (12/14/2022)    Readmission Risk Interventions     No data to display

## 2022-12-14 NOTE — Progress Notes (Signed)
Progress Note   Patient: Claudia Campbell ZOX:[id number] DOB: 1971-08-26 DOA: 12/12/2022     1 DOS: the patient was seen and examined on 12/14/2022   Brief hospital course: Taken from H&P.  Marabeth Melland is a 52 y.o. female with a history of hemiplegic migraines, asthma, GERD, Barrett's esophagus, hypothyroidism, and OSA who presents with chest pain, acute onset around 7:30 PM, now resolved.  The patient states that the pain started to the left of her sternum and then spread to her arm.  It was sharp in quality.  It started shortly after finishing a meal.  She felt nauseated and dry heaved but did not vomit.  Patient was found to have elevated troponin which peaked at 15,959. Patient was started on heparin infusion and cardiology was consulted for concern of NSTEMI.  EKG was negative for any acute ST changes.  4/17: Vital stable.  Patient underwent left heart catheterization which shows a SCAD of LAD. Heparin infusion was discontinued.  Patient was placed on aspirin and metoprolol and will stay in the hospital for next couple of days for more monitoring.  Echo pending  4/18: Vitals and lab stable.  Normal lipid profile, lipoprotein a pending. Echocardiogram with EF of 50 to 55% which is low normal.  Also shows regional wall motion abnormalities involving mid to distal anterior, anteroseptal and apical regions.  Normal diastolic function.  Cardiology also added losartan.  Assessment and Plan: * Coronary artery dissection Patient presented with chest pain and rising troponin.  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 Heparin was discontinued. Echocardiogram with low normal EF and regional wall motion abnormalities. -Continue metoprolol and aspirin -Cardiology also added losartan -Continue to monitor    Postablative hypothyroidism Cont with levothyroxine.  Barrett's esophagus IV PPI therapy. Patient has extensive  GI history with Barrett's esophagus.  GERD (gastroesophageal reflux disease) IV PPI.   Morbid obesity Estimated body mass index is 65.39 kg/m as calculated from the following:   Height as of this encounter: 5' 9.02" (1.753 m).   Weight as of this encounter: 200.9 kg.   -This will complicate overall prognosis  Obstructive sleep apnea CPAP per home settings.    Subjective: Patient denies any chest pain or shortness of breath today.  Physical Exam: Vitals:   12/14/22 0000 12/14/22 0343 12/14/22 0801 12/14/22 1109  BP: 112/74 124/70 108/60 139/80  Pulse: 67 66 67 71  Resp: Temp: 97.8 F (36.6 C) 97.6 F (36.4 C) 97.8 F (36.6 C) 98.1 F (36.7 C)  TempSrc:   Oral Oral  SpO2: 98% 99% 95% 97%  Weight:      Height:       General.  Morbidly obese lady, in no acute distress. Pulmonary.  Lungs clear bilaterally, normal respiratory effort. CV.  Regular rate and rhythm, no JVD, rub or murmur. Abdomen.  Soft, nontender, nondistended, BS positive. CNS.  Alert and oriented .  No focal neurologic deficit. Extremities.  No edema, no cyanosis, pulses intact and symmetrical. Psychiatry.  Judgment and insight appears normal.   Data Reviewed: Prior data reviewed  Family Communication: Discussed with patient  Disposition: Status is: Inpatient Remains inpatient appropriate because: Severity of illness  Planned Discharge Destination: Home  Time spent: 45 minutes  This record has been created using Conservation officer, historic buildings. Errors have been sought and corrected,but may not always be located. Such creation errors do  not reflect on the standard of care.   Author: Arnetha Courser, MD 12/14/2022 2:40 PM  For on call review [email or web address].

## 2022-12-14 NOTE — Assessment & Plan Note (Signed)
CPAP per home settings.  

## 2022-12-15 DIAGNOSIS — K2100 Gastro-esophageal reflux disease with esophagitis, without bleeding: Secondary | ICD-10-CM

## 2022-12-15 DIAGNOSIS — E890 Postprocedural hypothyroidism: Secondary | ICD-10-CM | POA: Diagnosis not present

## 2022-12-15 DIAGNOSIS — G4733 Obstructive sleep apnea (adult) (pediatric): Secondary | ICD-10-CM | POA: Diagnosis not present

## 2022-12-15 DIAGNOSIS — I214 Non-ST elevation (NSTEMI) myocardial infarction: Secondary | ICD-10-CM | POA: Diagnosis not present

## 2022-12-15 DIAGNOSIS — K2270 Barrett's esophagus without dysplasia: Secondary | ICD-10-CM | POA: Diagnosis not present

## 2022-12-15 DIAGNOSIS — I2542 Coronary artery dissection: Secondary | ICD-10-CM | POA: Diagnosis not present

## 2022-12-15 LAB — LIPOPROTEIN A (LPA): Lipoprotein (a): 19.9 nmol/L (ref <75.0)

## 2022-12-15 MED ORDER — METOPROLOL SUCCINATE ER 25 MG PO TB24: 25.0000 mg | ORAL_TABLET | Freq: Every evening | ORAL | 1 refills | Status: DC

## 2022-12-15 MED ORDER — ASPIRIN 81 MG PO TBEC: 81.0000 mg | DELAYED_RELEASE_TABLET | Freq: Every day | ORAL | 12 refills | Status: AC

## 2022-12-15 MED ORDER — LOSARTAN POTASSIUM 25 MG PO TABS: 12.5000 mg | ORAL_TABLET | Freq: Every day | ORAL | 1 refills | Status: DC

## 2022-12-15 MED ORDER — ATORVASTATIN CALCIUM 40 MG PO TABS: 40.0000 mg | ORAL_TABLET | Freq: Every day | ORAL | 1 refills | Status: DC

## 2022-12-15 MED ORDER — NITROGLYCERIN 0.4 MG SL SUBL: 0.4000 mg | SUBLINGUAL_TABLET | SUBLINGUAL | 12 refills | Status: AC | PRN

## 2022-12-15 NOTE — Discharge Summary (Signed)
Physician Discharge Summary   Patient: Claudia Campbell MRN: [id number] DOB: 1970/12/24  Admit date:     12/12/2022  Discharge date: 12/15/22  Discharge Physician: Arnetha Courser   PCP: [facility]   Recommendations at discharge:  Please obtain CBC and BMP in 1 week Follow-up with cardiology Follow-up for cardiac rehab  Discharge Diagnoses: Principal Problem:   Coronary artery dissection Active Problems:   Postablative hypothyroidism   Barrett's esophagus   GERD (gastroesophageal reflux disease)   Morbid obesity   Obstructive sleep apnea   NSTEMI (non-ST elevated myocardial infarction)   Hospital Course: Taken from H&P.  Claudia Campbell is a 52 y.o. female with a history of hemiplegic migraines, asthma, GERD, Barrett's esophagus, hypothyroidism, and OSA who presents with chest pain, acute onset around 7:30 PM, now resolved.  The patient states that the pain started to the left of her sternum and then spread to her arm.  It was sharp in quality.  It started shortly after finishing a meal.  She felt nauseated and dry heaved but did not vomit.  Patient was found to have elevated troponin which peaked at 15,959. Patient was started on heparin infusion and cardiology was consulted for concern of NSTEMI.  EKG was negative for any acute ST changes.  4/17: Vital stable.  Patient underwent left heart catheterization which shows a SCAD of LAD. Heparin infusion was discontinued.  Patient was placed on aspirin and metoprolol and will stay in the hospital for next couple of days for more monitoring.  Echo pending  4/18: Vitals and lab stable.  Normal lipid profile, lipoprotein a pending. Echocardiogram with EF of 50 to 55% which is low normal.  Also shows regional wall motion abnormalities involving mid to distal anterior, anteroseptal and apical regions.  Normal diastolic function.  Cardiology also added losartan.  4/19; patient remained stable.  Able to ambulate without any chest  pain or shortness of breath.  Cardiology cleared her for discharge.  She was provided referral for cardiac rehab.  She is being discharged on aspirin, statin, metoprolol and low-dose losartan.  Patient need to have a close follow-up with cardiology for further recommendations.  She was also encouraged to lost weight as it will help with the prognosis.  Assessment and Plan: * Coronary artery dissection Patient presented with chest pain and rising troponin.  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 Heparin was discontinued. Echocardiogram with low normal EF and regional wall motion abnormalities. -Continue metoprolol and aspirin -Cardiology also added losartan -Continue to monitor    Postablative hypothyroidism Cont with levothyroxine.  Barrett's esophagus IV PPI therapy. Patient has extensive GI history with Barrett's esophagus.  GERD (gastroesophageal reflux disease) IV PPI.   Morbid obesity Estimated body mass index is 65.39 kg/m as calculated from the following:   Height as of this encounter: 5' 9.02" (1.753 m).   Weight as of this encounter: 200.9 kg.   -This will complicate overall prognosis  Obstructive sleep apnea CPAP per home settings.    Consultants: Cardiology Procedures performed: Cardiac catheterization Disposition: Home Diet recommendation:  Discharge Diet Orders (From admission, onward)     Start     Ordered   12/15/22 0000  Diet - low sodium heart healthy        12/15/22 1133           Cardiac diet DISCHARGE MEDICATION: Allergies as of 12/15/2022  Reactions   Aspartame Other (See Comments)   Passes out   Ciprofloxacin Hives   Ilosone [erythromycin] Hives   Latex Swelling   Around mouth   Dicyclomine Palpitations        Medication List     STOP taking these medications    fluconazole 150 MG tablet Commonly known as: DIFLUCAN   methylPREDNISolone 4  MG Tbpk tablet Commonly known as: MEDROL DOSEPAK   pramipexole 1 MG tablet Commonly known as: MIRAPEX   zonisamide 100 MG capsule Commonly known as: ZONEGRAN       TAKE these medications    albuterol 108 (90 Base) MCG/ACT inhaler Commonly known as: VENTOLIN HFA Inhale 2 puffs into the lungs every 6 (six) hours as needed for wheezing or shortness of breath.   aspirin EC 81 MG tablet Take 1 tablet (81 mg total) by mouth daily. Swallow whole. Start taking on: December 16, 2022   atorvastatin 40 MG tablet Commonly known as: LIPITOR Take 1 tablet (40 mg total) by mouth daily. Start taking on: December 16, 2022   augmented betamethasone dipropionate 0.05 % cream Commonly known as: DIPROLENE-AF APPLY THIN FILM TO HANDS AND FEET SITES TWICE DAILY UNTIL CLEAR   betamethasone dipropionate 0.05 % cream Apply 1 Application topically daily.   cholecalciferol 10 MCG (400 UNIT) Tabs tablet Commonly known as: VITAMIN D3 Take 400 Units by mouth.   Ciclopirox 1 % shampoo Apply 1 Application topically 3 (three) times a week.   esomeprazole 40 MG capsule Commonly known as: NEXIUM Take 40 mg by mouth 2 (two) times daily before a meal.   fluocinonide 0.05 % external solution Commonly known as: LIDEX Apply 1 Application topically 2 (two) times daily.   fluticasone 50 MCG/ACT nasal spray Commonly known as: FLONASE Place 1 spray into both nostrils daily.   levothyroxine 88 MCG tablet Commonly known as: SYNTHROID Take 88 mcg by mouth daily before breakfast.   losartan 25 MG tablet Commonly known as: COZAAR Take 0.5 tablets (12.5 mg total) by mouth daily. Start taking on: December 16, 2022   meclizine 25 MG tablet Commonly known as: ANTIVERT Take 1 tablet (25 mg total) by mouth 3 (three) times daily as needed for dizziness.   metoprolol succinate 25 MG 24 hr tablet Commonly known as: TOPROL-XL Take 1 tablet (25 mg total) by mouth every evening.   montelukast 10 MG tablet Commonly  known as: SINGULAIR Take 1 tablet by mouth at bedtime.   nitroGLYCERIN 0.4 MG SL tablet Commonly known as: NITROSTAT Place 1 tablet (0.4 mg total) under the tongue every 5 (five) minutes as needed for chest pain.   sertraline 100 MG tablet Commonly known as: ZOLOFT Take 150 mg by mouth daily.   tacrolimus 0.1 % ointment Commonly known as: PROTOPIC Apply 1 Application topically 2 (two) times daily.        Follow-up Information     [place], [facility]. Schedule an appointment as soon as possible for a visit in 1 week(s).   Contact information: [address] [place] [address] [phone]         End, Cristal Deer, MD. Schedule an appointment as soon as possible for a visit in 1 week(s).   Specialty: Cardiology Contact information: [address] [place] [address] [phone]                Discharge Exam: Ceasar Mons Weights   12/12/22 2108 12/13/22 1319 12/13/22 1813  Weight: (!) 199.6 kg Marland Kitchen)  200.9 kg (!) 200.8 kg   General.  Morbidly obese lady, in no acute distress. Pulmonary.  Lungs clear bilaterally, normal respiratory effort. CV.  Regular rate and rhythm, no JVD, rub or murmur. Abdomen.  Soft, nontender, nondistended, BS positive. CNS.  Alert and oriented .  No focal neurologic deficit. Extremities.  No edema, no cyanosis, pulses intact and symmetrical. Psychiatry.  Judgment and insight appears normal.   Condition at discharge: stable  The results of significant diagnostics from this hospitalization (including imaging, microbiology, ancillary and laboratory) are listed below for reference.   Imaging Studies: ECHOCARDIOGRAM COMPLETE  Result Date: 12/14/2022    ECHOCARDIOGRAM REPORT   Patient Name:   [employer] Graves Date of Exam: 12/13/2022 Medical Rec #:  [id number]     Height:       69.0 in Accession #:    [id number]    Weight:       442.7 lb Date of Birth:  [birth year]     BSA:          2.896 m Patient Age:    51 years      BP:            151/101 mmHg Patient Gender: F             HR:           79 bpm. Exam Location:  [facility] Procedure: 2D Echo, Cardiac Doppler, Color Doppler and Intracardiac            Opacification Agent Indications:     I21.9 Acute Myocardial Infarction  History:         Patient has prior history of Echocardiogram examinations, most                  recent 06/10/2019. Risk Factors:Sleep Apnea.  Sonographer:     Sedonia Small Rodgers-Jones RDCS Referring Phys:  [id number] CHRISTOPHER END Diagnosing Phys: Julien Nordmann MD  Sonographer Comments: Technically difficult study due to poor echo windows. IMPRESSIONS  1. Left ventricular ejection fraction, by estimation, is 50 to 55%. The left ventricle has low normal function. The left ventricle demonstrates regional wall motion abnormalities (hypokinesis of the mid to distal anterior,anteroseptal and apical region). Left ventricular diastolic parameters were normal.  2. Right ventricular systolic function is normal. The right ventricular size is normal. Tricuspid regurgitation signal is inadequate for assessing PA pressure.  3. The mitral valve is normal in structure. No evidence of mitral valve regurgitation. No evidence of mitral stenosis.  4. The aortic valve is normal in structure. Aortic valve regurgitation is not visualized. No aortic stenosis is present.  5. The inferior vena cava is normal in size with greater than 50% respiratory variability, suggesting right atrial pressure of 3 mmHg. FINDINGS  Left Ventricle: Left ventricular ejection fraction, by estimation, is 50 to 55%. The left ventricle has low normal function. The left ventricle demonstrates regional wall motion abnormalities. Definity contrast agent was given IV to delineate the left ventricular endocardial borders. The left ventricular internal cavity size was normal in size. There is no left ventricular hypertrophy. Left ventricular diastolic parameters were normal. Right Ventricle: The right ventricular size is normal. No  increase in right ventricular wall thickness. Right ventricular systolic function is normal. Tricuspid regurgitation signal is inadequate for assessing PA pressure. Left Atrium: Left atrial size was normal in size. Right Atrium: Right atrial size was normal in size. Pericardium: There is no evidence of pericardial effusion. Mitral Valve: The mitral valve is normal in structure.  No evidence of mitral valve regurgitation. No evidence of mitral valve stenosis. Tricuspid Valve: The tricuspid valve is normal in structure. Tricuspid valve regurgitation is not demonstrated. No evidence of tricuspid stenosis. Aortic Valve: The aortic valve is normal in structure. Aortic valve regurgitation is not visualized. No aortic stenosis is present. Pulmonic Valve: The pulmonic valve was normal in structure. Pulmonic valve regurgitation is not visualized. No evidence of pulmonic stenosis. Aorta: The aortic root is normal in size and structure. Venous: The inferior vena cava is normal in size with greater than 50% respiratory variability, suggesting right atrial pressure of 3 mmHg. IAS/Shunts: No atrial level shunt detected by color flow Doppler.  LEFT VENTRICLE PLAX 2D LVIDd:         5.30 cm   Diastology LVIDs:         3.70 cm   LV e' medial:    7.72 cm/s LV PW:         0.90 cm   LV E/e' medial:  11.0 LV IVS:        1.00 cm   LV e' lateral:   9.61 cm/s LVOT diam:     2.20 cm   LV E/e' lateral: 8.9 LV SV:         86 LV SV Index:   30 LVOT Area:     3.80 cm  RIGHT VENTRICLE             IVC RV Basal diam:  4.00 cm     IVC diam: 1.80 cm RV S prime:     19.65 cm/s TAPSE (M-mode): 3.2 cm LEFT ATRIUM             Index        RIGHT ATRIUM           Index LA diam:        4.90 cm 1.69 cm/m   RA Area:     18.90 cm LA Vol (A2C):   40.6 ml 14.02 ml/m  RA Volume:   56.30 ml  19.44 ml/m LA Vol (A4C):   49.2 ml 16.99 ml/m LA Biplane Vol: 48.5 ml 16.75 ml/m  AORTIC VALVE LVOT Vmax:   124.50 cm/s LVOT Vmean:  82.500 cm/s LVOT VTI:    0.225 m   AORTA Ao Root diam: 3.60 cm Ao Asc diam:  3.80 cm MITRAL VALVE MV Area (PHT): 3.56 cm    SHUNTS MV Decel Time: 213 msec    Systemic VTI:  0.22 m MV E velocity: 85.15 cm/s  Systemic Diam: 2.20 cm MV A velocity: 76.75 cm/s MV E/A ratio:  1.11 Julien Nordmann MD Electronically signed by Julien Nordmann MD Signature Date/Time: 12/14/2022/11:46:51 AM    Final    CARDIAC CATHETERIZATION  Result Date: 12/13/2022 Conclusions: Single-vessel coronary artery disease with irregular tapering distal LAD and small D4 branch with up to 70% stenosis.  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  No significant disease observed in the LMCA, LCx, or RCA. Probably mildly reduced left ventricular systolic function with apical hypokinesis, though suboptimal LV opacification limits evaluation (LVEF 45-50%). Moderately elevated left ventricular filling pressure (LVEDP 25-30 mmHg). Recommendations: Continue aspirin 81 mg daily.  Defer addition of P2Y12 and other antithrombotic agents in the setting of spontaneous coronary artery dissection. Follow-up echocardiogram. Gentle diuresis and escalation of goal-directed medical therapy as tolerated. Recommend observation at least 1-2 more nights to ensure patient does not have recurrent chest pain to suggest propagation/recurrence of SCAD. [name], MD [facility]  DG Chest 1 View  Result Date: 12/12/2022 CLINICAL DATA:  Chest pain EXAM: PORTABLE CHEST 1 VIEW COMPARISON:  05/03/2021. FINDINGS: Cardiac silhouette appears prominent. No pneumonia or pulmonary edema. No pneumothorax or pleural effusion. IMPRESSION: Enlarged cardiac silhouette.  No focal consolidation. Electronically Signed   By: Layla Maw M.D.   On: 12/12/2022 21:28    Microbiology: Results for orders placed or performed during the hospital encounter of 10/18/19  Urine culture     Status: Abnormal   Collection Time: 10/18/19  6:14 PM   Specimen: Urine, Random  Result Value Ref  Range Status   Specimen Description   Final    URINE, RANDOM Performed at [facility], [address], [place], [place] [address]    Special Requests   Final    NONE Performed at [facility], [address] [place], [place] [address]    Culture (A)  Final    >=100,000 COLONIES/mL DIPHTHEROIDS(CORYNEBACTERIUM SPECIES) Standardized susceptibility testing for this organism is not available. Performed at [facility], [address] [place], [place] [address]    Report Status 10/20/2019 FINAL  Final    Labs: CBC: Recent Labs  Lab 12/12/22 2111 12/14/22 0428  WBC 11.6* 7.3  HGB 13.0 12.1  HCT 41.7 39.0  MCV 88.0 87.8  PLT 332 284   Basic Metabolic Panel: Recent Labs  Lab 12/12/22 2111 12/14/22 0428  NA 134* 138  K 3.9 3.6  CL 101 104  CO2 22 25  GLUCOSE 130* 111*  BUN 13 9  CREATININE 0.63 0.71  CALCIUM 9.2 8.5*   Liver Function Tests: Recent Labs  Lab 12/12/22 2111  AST 45*  ALT 58*  ALKPHOS 65  BILITOT 0.6  PROT 7.4  ALBUMIN 3.7   CBG: No results for input(s): "GLUCAP" in the last 168 hours.  Discharge time spent: greater than 30 minutes.  This record has been created using Conservation officer, historic buildings. Errors have been sought and corrected,but may not always be located. Such creation errors do not reflect on the standard of care.   Signed: Arnetha Courser, MD [facility] 12/15/2022

## 2022-12-15 NOTE — Progress Notes (Signed)
Rounding Note    Patient Name: Claudia Campbell Date of Encounter: 12/15/2022  [facility] Cardiologist: None   Attending Note Patient seen and examined, agree with detailed note above,   Patient presentation and plan discussed on rounds.    EKG lab work, chest x-ray, echocardiogram reviewed independently by myself  Reports feeling well this morning, no complaints Denies significant chest pain or shortness of breath on exertion Feels well to go home Tolerating metoprolol succinate 25  On examination : alert oriented, no JVD, lungs clear to auscultation bilaterally, heart sounds regular normal S1-S2 no murmurs appreciated, abdomen soft nontender no significant lower extremity edema.  Musculoskeletal exam with good range of motion, neurologic exam grossly nonfocal     Latest Ref Rng & Units 12/14/2022    4:28 AM 12/12/2022    9:11 PM 05/03/2021    8:40 PM  BMP  Glucose 70 - 99 mg/dL 161  096  045   BUN 6 - 20 mg/dL Creatinine 0.44 - 1.00 mg/dL 4.09  8.11  9.14   Sodium 135 - 145 mmol/L 138  134  135   Potassium 3.5 - 5.1 mmol/L 3.6  3.9  4.0   Chloride 98 - 111 mmol/L 104  101  102   CO2 22 - 32 mmol/L Calcium 8.9 - 10.3 mg/dL 8.5  9.2  9.3       Latest Ref Rng & Units 12/14/2022    4:28 AM 12/12/2022    9:11 PM 05/03/2021    8:40 PM  CBC  WBC 4.0 - 10.5 K/uL 7.3  11.6  10.5   Hemoglobin 12.0 - 15.0 g/dL 78.2  95.6  21.3   Hematocrit 36.0 - 46.0 % 39.0  41.7  41.0   Platelets 150 - 400 K/uL 284  332  350      A/p: Non-STEMI In the setting of SCAD, distal LAD region on catheterization performed yesterday Ejection fraction 50 to 55%, small region focal wall motion abnormality mid to distal anteroseptal, anterior wall, apical region Recommend we continue metoprolol succinate 25 daily, losartan 12.5 daily -Cardiac rehab ordered On low-dose statin   Sleep apnea Continue CPAP   Hypothyroidism On levothyroxine   Morbid obesity Low  carbohydrate diet recommended, cardiac rehab Consider newer agents such as Ozempic/Mounjaro     Greater than 50% was spent in counseling and coordination of care with patient Total encounter time 35 minutes or more   Signed: Dossie Arbour  M.D., Ph.D. [facility]   Subjective   Patient was seen in AM rounds. She reported feeling much better. She denied chest pain, shortness of breath, or palpitations.   Inpatient Medications    Scheduled Meds:  aspirin EC  81 mg Oral Daily   atorvastatin  40 mg Oral Daily   levothyroxine  88 mcg Oral Q0600   losartan  12.5 mg Oral Daily   metoprolol succinate  25 mg Oral QPM   montelukast  10 mg Oral QHS   pantoprazole (PROTONIX) IV  40 mg Intravenous Q12H   sertraline  150 mg Oral Daily   sodium chloride flush  3 mL Intravenous Q12H   sodium chloride flush  3 mL Intravenous Q12H   Continuous Infusions:  sodium chloride     PRN Meds: sodium chloride, acetaminophen, fluticasone, morphine injection, nitroGLYCERIN, ondansetron (ZOFRAN) IV, sodium chloride flush   Vital Signs    Vitals:   12/14/22 1716  12/14/22 2056 12/15/22 0024 12/15/22 0555  BP: 130/78 119/70 112/64 127/73  Pulse: 73 79 74 66  Resp: 16 18 18 18   Temp: 98 F (36.7 C) 98.2 F (36.8 C) 97.9 F (36.6 C) 97.7 F (36.5 C)  TempSrc:      SpO2: 95% 95% 97% 96%  Weight:      Height:        Intake/Output Summary (Last 24 hours) at 12/15/2022 0736 Last data filed at 12/14/2022 1934 Gross per 24 hour  Intake 1000 ml  Output --  Net 1000 ml      12/13/2022    6:13 PM 12/13/2022    1:19 PM 12/12/2022    9:08 PM  Last 3 Weights  Weight (lbs) 442 lb 11.2 oz 443 lb 440 lb  Weight (kg) 200.807 kg 200.943 kg 199.583 kg      Telemetry    NSR with PVC and rate in 70s - Personally Reviewed  ECG    No new tracings  - Personally Reviewed  Physical Exam   GEN: No acute distress.   Neck: No JVD Cardiac: RRR, no murmurs, rubs, or gallops.  Respiratory: Clear to  auscultation bilaterally. GI: Soft, nontender, non-distended  MS: No edema; No deformity. Neuro:  Nonfocal  Psych: Normal affect   Labs    High Sensitivity Troponin:   Recent Labs  Lab 12/12/22 2111 12/13/22 0051 12/13/22 0215 12/13/22 1012  TROPONINIHS 242* 15,959* 14,150* 9,449*     Chemistry Recent Labs  Lab 12/12/22 2111 12/14/22 0428  NA 134* 138  K 3.9 3.6  CL 101 104  CO2 22 25  GLUCOSE 130* 111*  BUN 13 9  CREATININE 0.63 0.71  CALCIUM 9.2 8.5*  PROT 7.4  --   ALBUMIN 3.7  --   AST 45*  --   ALT 58*  --   ALKPHOS 65  --   BILITOT 0.6  --   GFRNONAA >60 >60  ANIONGAP 11 9    Lipids  Recent Labs  Lab 12/14/22 0428  CHOL 140  TRIG 103  HDL 43  LDLCALC 76  CHOLHDL 3.3    Hematology Recent Labs  Lab 12/12/22 2111 12/14/22 0428  WBC 11.6* 7.3  RBC 4.74 4.44  HGB 13.0 12.1  HCT 41.7 39.0  MCV 88.0 87.8  MCH 27.4 27.3  MCHC 31.2 31.0  RDW 14.8 15.1  PLT 332 284   Thyroid No results for input(s): "TSH", "FREET4" in the last 168 hours.  BNPNo results for input(s): "BNP", "PROBNP" in the last 168 hours.  DDimer  Recent Labs  Lab 12/13/22 0051  DDIMER 0.55*     Radiology    ECHOCARDIOGRAM COMPLETE  Result Date: 12/14/2022    ECHOCARDIOGRAM REPORT   Patient Name:   Claudia Campbell Date of Exam: 12/13/2022 Medical Rec #:  [id number]     Height:       69.0 in Accession #:    [id number]    Weight:       442.7 lb Date of Birth:  [birth year]     BSA:          2.896 m Patient Age:    51 years      BP:           151/101 mmHg Patient Gender: F             HR:           79 bpm. Exam Location:  [facility] Procedure: 2D Echo, Cardiac  Doppler, Color Doppler and Intracardiac            Opacification Agent Indications:     I21.9 Acute Myocardial Infarction  History:         Patient has prior history of Echocardiogram examinations, most                  recent 06/10/2019. Risk Factors:Sleep Apnea.  Sonographer:     Sedonia Small Rodgers-Jones RDCS Referring Phys:  [id number]  CHRISTOPHER END Diagnosing Phys: Julien Nordmann MD  Sonographer Comments: Technically difficult study due to poor echo windows. IMPRESSIONS  1. Left ventricular ejection fraction, by estimation, is 50 to 55%. The left ventricle has low normal function. The left ventricle demonstrates regional wall motion abnormalities (hypokinesis of the mid to distal anterior,anteroseptal and apical region). Left ventricular diastolic parameters were normal.  2. Right ventricular systolic function is normal. The right ventricular size is normal. Tricuspid regurgitation signal is inadequate for assessing PA pressure.  3. The mitral valve is normal in structure. No evidence of mitral valve regurgitation. No evidence of mitral stenosis.  4. The aortic valve is normal in structure. Aortic valve regurgitation is not visualized. No aortic stenosis is present.  5. The inferior vena cava is normal in size with greater than 50% respiratory variability, suggesting right atrial pressure of 3 mmHg. FINDINGS  Left Ventricle: Left ventricular ejection fraction, by estimation, is 50 to 55%. The left ventricle has low normal function. The left ventricle demonstrates regional wall motion abnormalities. Definity contrast agent was given IV to delineate the left ventricular endocardial borders. The left ventricular internal cavity size was normal in size. There is no left ventricular hypertrophy. Left ventricular diastolic parameters were normal. Right Ventricle: The right ventricular size is normal. No increase in right ventricular wall thickness. Right ventricular systolic function is normal. Tricuspid regurgitation signal is inadequate for assessing PA pressure. Left Atrium: Left atrial size was normal in size. Right Atrium: Right atrial size was normal in size. Pericardium: There is no evidence of pericardial effusion. Mitral Valve: The mitral valve is normal in structure. No evidence of mitral valve regurgitation. No evidence of mitral valve  stenosis. Tricuspid Valve: The tricuspid valve is normal in structure. Tricuspid valve regurgitation is not demonstrated. No evidence of tricuspid stenosis. Aortic Valve: The aortic valve is normal in structure. Aortic valve regurgitation is not visualized. No aortic stenosis is present. Pulmonic Valve: The pulmonic valve was normal in structure. Pulmonic valve regurgitation is not visualized. No evidence of pulmonic stenosis. Aorta: The aortic root is normal in size and structure. Venous: The inferior vena cava is normal in size with greater than 50% respiratory variability, suggesting right atrial pressure of 3 mmHg. IAS/Shunts: No atrial level shunt detected by color flow Doppler.  LEFT VENTRICLE PLAX 2D LVIDd:         5.30 cm   Diastology LVIDs:         3.70 cm   LV e' medial:    7.72 cm/s LV PW:         0.90 cm   LV E/e' medial:  11.0 LV IVS:        1.00 cm   LV e' lateral:   9.61 cm/s LVOT diam:     2.20 cm   LV E/e' lateral: 8.9 LV SV:         86 LV SV Index:   30 LVOT Area:     3.80 cm  RIGHT VENTRICLE  IVC RV Basal diam:  4.00 cm     IVC diam: 1.80 cm RV S prime:     19.65 cm/s TAPSE (M-mode): 3.2 cm LEFT ATRIUM             Index        RIGHT ATRIUM           Index LA diam:        4.90 cm 1.69 cm/m   RA Area:     18.90 cm LA Vol (A2C):   40.6 ml 14.02 ml/m  RA Volume:   56.30 ml  19.44 ml/m LA Vol (A4C):   49.2 ml 16.99 ml/m LA Biplane Vol: 48.5 ml 16.75 ml/m  AORTIC VALVE LVOT Vmax:   124.50 cm/s LVOT Vmean:  82.500 cm/s LVOT VTI:    0.225 m  AORTA Ao Root diam: 3.60 cm Ao Asc diam:  3.80 cm MITRAL VALVE MV Area (PHT): 3.56 cm    SHUNTS MV Decel Time: 213 msec    Systemic VTI:  0.22 m MV E velocity: 85.15 cm/s  Systemic Diam: 2.20 cm MV A velocity: 76.75 cm/s MV E/A ratio:  1.11 Julien Nordmann MD Electronically signed by Julien Nordmann MD Signature Date/Time: 12/14/2022/11:46:51 AM    Final    CARDIAC CATHETERIZATION  Result Date: 12/13/2022 Conclusions: Single-vessel coronary artery  disease with irregular tapering distal LAD and small D4 branch with up to 70% stenosis.  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  No significant disease observed in the LMCA, LCx, or RCA. Probably mildly reduced left ventricular systolic function with apical hypokinesis, though suboptimal LV opacification limits evaluation (LVEF 45-50%). Moderately elevated left ventricular filling pressure (LVEDP 25-30 mmHg). Recommendations: Continue aspirin 81 mg daily.  Defer addition of P2Y12 and other antithrombotic agents in the setting of spontaneous coronary artery dissection. Follow-up echocardiogram. Gentle diuresis and escalation of goal-directed medical therapy as tolerated. Recommend observation at least 1-2 more nights to ensure patient does not have recurrent chest pain to suggest propagation/recurrence of SCAD. [name], MD [facility]   Cardiac Studies   TTE 06/10/19  1. Left ventricular ejection fraction, by visual estimation, is 65 to  70%. The left ventricle has normal function. Normal left ventricular size.  There is moderately increased left ventricular hypertrophy.   2. Global right ventricle has normal systolic function.The right  ventricular size is not well visualized. Mildly increased right  ventricular wall thickness.   3. Left atrial size was not well visualized.   4. Right atrial size was not well visualized.   5. The pericardium was not well visualized.   6. The mitral valve was not well visualized. No evidence of mitral valve  regurgitation.   7. The tricuspid valve is not well visualized. Tricuspid valve  regurgitation was not visualized by color flow Doppler.   8. The aortic valve was not well visualized Aortic valve regurgitation  was not assessed by color flow Doppler.   9. The pulmonic valve was not well visualized. Pulmonic valve  regurgitation is not visualized by color flow Doppler.  10. The interatrial septum was not well  visualized.    Left Heart Catheterization 12/13/2022 Single-vessel coronary artery disease with irregular tapering distal LAD and small D4 branch with up to 70% stenosis.  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  No significant disease observed in the LMCA, LCx, or RCA. Probably mildly reduced left ventricular systolic function with apical hypokinesis, though suboptimal LV opacification limits evaluation (LVEF 45-50%).  Moderately elevated left ventricular filling pressure (LVEDP 25-30 mmHg).  Patient Profile     52 y.o. female  with a hx of anxiety, Barrett's esophagus, depression, gastroesophageal reflux disease, hypothyroidism status post ablation, kidney stones, migraines, restless leg syndrome, sleep apnea not currently on CPAP, vitamin D deficiency, who is being seen for the evaluation of chest pain.   Assessment & Plan    SCAD - Single-vessel coronary artery disease with irregular tapering distal LAD and small D4 branch with up to 70% stenosis.  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  No significant disease observed in the LMCA, LCx, or RCA. - Follow up echo showed small region focal wall motion abnormality distal anteroseptal, anterior wall and epical region with an EF of 50-55% - Continue aspiring 81 mg daily, nitro PRN  - Continue atorvastatin 10 mg daily, metoprolol succinate 12.5 mg daily, losartan 12.5 mg daily  - Follow up with cardiologist for long term care and testing    Obstructive Sleep Apnea - Continue CPAP nightly    Hypothyroidism  - Continue Levothyroxine - Treatment per IM recommendations   Morbid Obesity - Encouraged continued exercise and diet management    For questions or updates, please contact [facility] Please consult [email or web address] for contact info under     Signed, Nathanial Millman, Student-PA  12/15/2022, 7:36 AM

## 2022-12-18 ENCOUNTER — Telehealth: Payer: Self-pay | Admitting: Internal Medicine

## 2022-12-18 DIAGNOSIS — Z0279 Encounter for issue of other medical certificate: Secondary | ICD-10-CM

## 2022-12-18 NOTE — Telephone Encounter (Signed)
Patient paid $29 dropped off FMLA paperwork & placed in box.

## 2022-12-21 NOTE — Telephone Encounter (Signed)
Forms given to provider for review

## 2022-12-22 ENCOUNTER — Encounter: Payer: Self-pay | Admitting: Cardiology

## 2022-12-22 ENCOUNTER — Other Ambulatory Visit
Admission: RE | Admit: 2022-12-22 | Discharge: 2022-12-22 | Disposition: A | Discharge status: Home / Self Care | Payer: BC Managed Care – PPO | Source: Ambulatory Visit | Attending: Cardiology | Admitting: Cardiology

## 2022-12-22 ENCOUNTER — Ambulatory Visit: Payer: BC Managed Care – PPO | Attending: Internal Medicine | Admitting: Cardiology

## 2022-12-22 VITALS — BP 110/80 | HR 82 | Ht 69.0 in | Wt >= 6400 oz

## 2022-12-22 DIAGNOSIS — I214 Non-ST elevation (NSTEMI) myocardial infarction: Secondary | ICD-10-CM

## 2022-12-22 DIAGNOSIS — I2542 Coronary artery dissection: Secondary | ICD-10-CM | POA: Diagnosis not present

## 2022-12-22 DIAGNOSIS — Z6841 Body Mass Index (BMI) 40.0 and over, adult: Secondary | ICD-10-CM

## 2022-12-22 DIAGNOSIS — G4730 Sleep apnea, unspecified: Secondary | ICD-10-CM

## 2022-12-22 DIAGNOSIS — R0602 Shortness of breath: Secondary | ICD-10-CM

## 2022-12-22 LAB — CBC
HCT: 44.4 % (ref 36.0–46.0)
Hemoglobin: 14 g/dL (ref 12.0–15.0)
MCH: 27.3 pg (ref 26.0–34.0)
MCHC: 31.5 g/dL (ref 30.0–36.0)
MCV: 86.5 fL (ref 80.0–100.0)
Platelets: 366 10*3/uL (ref 150–400)
RBC: 5.13 MIL/uL — ABNORMAL HIGH (ref 3.87–5.11)
RDW: 14.9 % (ref 11.5–15.5)
WBC: 10.4 10*3/uL (ref 4.0–10.5)
nRBC: 0 % (ref 0.0–0.2)

## 2022-12-22 LAB — BASIC METABOLIC PANEL
Anion gap: 10 (ref 5–15)
BUN: 12 mg/dL (ref 6–20)
CO2: 23 mmol/L (ref 22–32)
Calcium: 9.8 mg/dL (ref 8.9–10.3)
Chloride: 104 mmol/L (ref 98–111)
Creatinine, Ser: 0.82 mg/dL (ref 0.44–1.00)
GFR, Estimated: >60 mL/min (ref >60)
Glucose, Bld: 92 mg/dL (ref 70–99)
Potassium: 4.2 mmol/L (ref 3.5–5.1)
Sodium: 137 mmol/L (ref 135–145)

## 2022-12-22 NOTE — Patient Instructions (Addendum)
Medication Instructions:  Your physician recommends that you continue on your current medications as directed. Please refer to the Current Medication list given to you today.  *If you need a refill on your cardiac medications before your next appointment, please call your pharmacy*   Lab Work: Your physician recommends that you get lab work today: CBC & BMET  Your physician recommends that you return for lab work in 2 months (June): Lipid and hepatic Pharmacologist at [facility] 1st desk on the right to check in (REGISTRATION)  Lab hours: Monday- Friday (7:30 am- 5:30 pm)   If you have labs (blood work) drawn today and your tests are completely normal, you will receive your results only by: MyChart Message (if you have MyChart) OR A paper copy in the mail If you have any lab test that is abnormal or we need to change your treatment, we will call you to review the results.   Testing/Procedures: -None ordered   Follow-Up: At [facility], you and your health needs are our priority.  As part of our continuing mission to provide you with exceptional heart care, we have created designated Provider Care Teams.  These Care Teams include your primary Cardiologist (physician) and Advanced Practice Providers (APPs -  Physician Assistants and Nurse Practitioners) who all work together to provide you with the care you need, when you need it.  We recommend signing up for the patient portal called "MyChart".  Sign up information is provided on this After Visit Summary.  MyChart is used to connect with patients for Virtual Visits (Telemedicine).  Patients are able to view lab/test results, encounter notes, upcoming appointments, etc.  Non-urgent messages can be sent to your provider as well.   To learn more about what you can do with MyChart, go to [email or web address].    Your next appointment:   6 week(s)  Provider:   You may see [name], MD or one of the following  Advanced Practice Providers on your designated Care Team:   Nicolasa Ducking, NP Eula Listen, PA-C Cadence Fransico Michael, PA-C Charlsie Quest, NP    Other Instructions - AMB referral to cardiac rehabilitation

## 2022-12-22 NOTE — Progress Notes (Signed)
Labs are stable. No medication changes needed.

## 2022-12-22 NOTE — Progress Notes (Signed)
Cardiology Office Note:   Date:  12/22/2022  ID:  Claudia Campbell, DOB 1971/01/08, MRN [id number]  History of Present Illness:   Claudia Campbell is a 52 y.o. female with past medical history of anxiety, Barrett's esophagus, depression, gastroesophageal reflux disease, hypothyroidism status post ablation, kidney stones, migraines, restless leg syndrome, sleep apnea not currently on CPAP, vitamin D deficiency, family history of coronary artery disease, who was just recently hospitalized with complaints of chest pain and was diagnosed with SCAD, who is here today for follow-up.   She was last seen in clinic 09/03/2019 by Dr. Okey Dupre.  She was previously found to have bradycardia encouraged to follow-up and have a sleep study completed as well as continued on her weight loss journey.  When she returned to her last visit she had lost between 20 to 25 pounds and continues to remain chest pain-free with no anginal symptoms.  She is also followed with EP for previous syncopal episode that was deemed to be a vasovagal event in nature.  She presented to the [facility] emergency department/16[facility]24 with complaints of chest pain started at 7 PM.  She states it started on the left with radiation to the left arm and armpit.  She described it as sharp in nature that started after a meal.  Patient felt nauseated with dry heaves but did not vomit.  High-sensitivity troponin peaked at 1559, she was placed on heparin infusion, was taken for heart catheterization.  Left heart catheterization revealed single-vessel coronary artery disease with irregular tapering distal LAD and small T4 branch of up to 70% percent stenosis.  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  No significant disease observed in the LMCA, left circumflex or RCA.  She was placed on aspirin 81 mg daily and follow-up echocardiogram completed general diuresis with escalation of GDMT as tolerated.  They deferred intervention. Deferred the addition of  antithrombotic agents in the setting of spontaneous coronary artery dissection.  Echocardiogram revealed LVEF 50-25% with no valvular abnormalities noted.  She was evaluated in the hospital and was subsequently considered stable for discharge on 12/15/2022.  Chest pain resolved.  Vitals were stable.  She returns to clinic today stating that overall she has been doing well. She states that she has had a few episodes of palpitations, continued with SOB/DOE that is worse on stairs, some occasionally weakness/heaviness in the right arm, unchanged peripheral edema to the bilateral lower extremities. Denies any chest pain or chest pressure, lightheadedness/dizziness, or claudications symptoms. She stated that she did try to work for about three hours this past week and was exhausted. So she has taken the rest of the week off.  ROS: 10 point review of systems has been completed and is considered negative with the exception of what is in the HPI  Studies Reviewed:    EKG: sinus rhythm with a rate of 82  TTE 12/13/22 1. Left ventricular ejection fraction, by estimation, is 50 to 55%. The  left ventricle has low normal function. The left ventricle demonstrates  regional wall motion abnormalities (hypokinesis of the mid to distal  anterior,anteroseptal and apical  region). Left ventricular diastolic parameters were normal.   2. Right ventricular systolic function is normal. The right ventricular  size is normal. Tricuspid regurgitation signal is inadequate for assessing  PA pressure.   3. The mitral valve is normal in structure. No evidence of mitral valve  regurgitation. No evidence of mitral stenosis.   4. The aortic valve is normal in structure. Aortic valve  regurgitation is  not visualized. No aortic stenosis is present.   5. The inferior vena cava is normal in size with greater than 50%  respiratory variability, suggesting right atrial pressure of 3 mmHg.    LHC  12/10/22 Conclusions: Single-vessel coronary artery disease with irregular tapering distal LAD and small D4 branch with up to 70% stenosis.  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  No significant disease observed in the LMCA, LCx, or RCA. Probably mildly reduced left ventricular systolic function with apical hypokinesis, though suboptimal LV opacification limits evaluation (LVEF 45-50%). Moderately elevated left ventricular filling pressure (LVEDP 25-30 mmHg).   Recommendations: Continue aspirin 81 mg daily.  Defer addition of P2Y12 and other antithrombotic agents in the setting of spontaneous coronary artery dissection. Follow-up echocardiogram. Gentle diuresis and escalation of goal-directed medical therapy as tolerated. Recommend observation at least 1-2 more nights to ensure patient does not have recurrent chest pain to suggest propagation/recurrence of SCAD.   [name], MD [facility]  Risk Assessment/Calculations:              Physical Exam:   VS:  BP 110/80 (BP Location: Left Arm, Patient Position: Sitting, Cuff Size: Normal)   Pulse 82   Ht 5\' 9"  (1.753 m)   Wt (!) 443 lb (200.9 kg)   SpO2 97%   BMI 65.42 kg/m    Wt Readings from Last 3 Encounters:  12/22/22 (!) 443 lb (200.9 kg)  12/13/22 (!) 442 lb 11.2 oz (200.8 kg)  05/03/21 (!) 392 lb (177.8 kg)     GEN: Well nourished, well developed in no acute distress NECK: No JVD; No carotid bruits CARDIAC: RRR, no murmurs, rubs, gallops RESPIRATORY:  Clear to auscultation without rales, wheezing or rhonchi  ABDOMEN: Soft, obese,  non-tender, non-distended EXTREMITIES:  trace edema; No deformity   ASSESSMENT AND PLAN:   NSTEMI/SCAD with single vessel coronary artery disease with irregular tapering of the distal LAD and small D4 branch with up to 70%, 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 No significant disease observed in the LMCA, Lcx, or RCA on LHC. Hs  troponins peaked 15,959. Denies any chest pain since hospital discharge. EKG without ischemic changes noted. Continued ono ASA 81 mg daily, atorvastatin 10 mg daily, and nitrostat PRN. Continued on metoprolol and losartan. Being sent for CBC and BMET today post procedure and after started new medications during hospitalization. Referred to cardiac rehab.  Shortness of breath and dyspnea with stairs. Echocardiogram revealed LVEF50-55%, no valvular abnormalities. Being sent for labs today of CBC and BMET. Referred to cardiac rehab. Likely a component of deconditioning.   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  Sleep apnea who had previously not been on Cpap. Recommend continuing with therapy post hospital discharge.  Morbid Obesity with BMI 65.42. Encouraged to walk and increase activity as tolerated. Continue to work on dietary and lifestyle modifications.  Disposition patient to return to clinic to see MD/APP in 6 weeks or sooner if needed.     Cardiac Rehabilitation Eligibility Assessment  The patient is ready to start cardiac rehabilitation from a cardiac standpoint.        Signed, Criss Pallone, NP

## 2022-12-25 ENCOUNTER — Other Ambulatory Visit: Payer: Self-pay

## 2022-12-25 DIAGNOSIS — I214 Non-ST elevation (NSTEMI) myocardial infarction: Secondary | ICD-10-CM

## 2022-12-25 DIAGNOSIS — E559 Vitamin D deficiency, unspecified: Secondary | ICD-10-CM

## 2022-12-25 NOTE — Telephone Encounter (Signed)
Patient is calling requesting an update on her FMLA forms. Please advise.

## 2022-12-25 NOTE — Progress Notes (Signed)
Lab orders placed for lipid and hepatic panel in June

## 2022-12-26 ENCOUNTER — Ambulatory Visit: Payer: BC Managed Care – PPO | Attending: Cardiology

## 2022-12-26 ENCOUNTER — Other Ambulatory Visit: Payer: Self-pay

## 2022-12-26 DIAGNOSIS — I495 Sick sinus syndrome: Secondary | ICD-10-CM

## 2022-12-26 DIAGNOSIS — R55 Syncope and collapse: Secondary | ICD-10-CM

## 2022-12-26 NOTE — Progress Notes (Signed)
Order placed for [employer].

## 2022-12-27 ENCOUNTER — Encounter: Payer: Self-pay | Admitting: *Deleted

## 2022-12-27 ENCOUNTER — Encounter: Payer: BC Managed Care – PPO | Attending: Internal Medicine | Admitting: *Deleted

## 2022-12-27 DIAGNOSIS — E119 Type 2 diabetes mellitus without complications: Secondary | ICD-10-CM | POA: Insufficient documentation

## 2022-12-27 DIAGNOSIS — I252 Old myocardial infarction: Secondary | ICD-10-CM | POA: Insufficient documentation

## 2022-12-27 DIAGNOSIS — I21A1 Myocardial infarction type 2: Secondary | ICD-10-CM | POA: Insufficient documentation

## 2022-12-27 DIAGNOSIS — I2542 Coronary artery dissection: Secondary | ICD-10-CM

## 2022-12-27 NOTE — Progress Notes (Signed)
Completed virtual orientation today.  EP evaluation is scheduled for Wed 5/8 at 230.  Documentation for diagnosis can be found in [facility] encounter 12/12/22.

## 2022-12-28 DIAGNOSIS — R55 Syncope and collapse: Secondary | ICD-10-CM

## 2022-12-28 DIAGNOSIS — I495 Sick sinus syndrome: Secondary | ICD-10-CM

## 2022-12-28 NOTE — Telephone Encounter (Addendum)
FMLA forms completed by provider. Pt stated she would prefer to pick up forms  Forms placed up front for pick up.  A copy placed in box to scan into chart.

## 2023-01-03 VITALS — Ht 69.75 in | Wt >= 6400 oz

## 2023-01-03 DIAGNOSIS — I252 Old myocardial infarction: Secondary | ICD-10-CM | POA: Diagnosis not present

## 2023-01-03 DIAGNOSIS — I2542 Coronary artery dissection: Secondary | ICD-10-CM

## 2023-01-03 DIAGNOSIS — E119 Type 2 diabetes mellitus without complications: Secondary | ICD-10-CM | POA: Diagnosis not present

## 2023-01-03 DIAGNOSIS — I21A1 Myocardial infarction type 2: Secondary | ICD-10-CM | POA: Diagnosis not present

## 2023-01-03 NOTE — Progress Notes (Signed)
Cardiac Individual Treatment Plan  Patient Details  Name: Claudia Campbell MRN: [id number] Date of Birth: [birth year] Referring Provider:   Flowsheet Row Cardiac Rehab from 01/03/2023 in [facility] Cardiac and Pulmonary Rehab  Referring Provider End, Cristal Deer MD       Initial Encounter Date:  Flowsheet Row Cardiac Rehab from 01/03/2023 in [facility] Cardiac and Pulmonary Rehab  Date 01/03/23       Visit Diagnosis: Type 2 MI (myocardial infarction) [facility])  Coronary artery dissection  Patient's Home Medications on Admission:  Current Outpatient Medications:    albuterol (VENTOLIN HFA) 108 (90 Base) MCG/ACT inhaler, Inhale 2 puffs into the lungs every 6 (six) hours as needed for wheezing or shortness of breath., Disp: , Rfl:    aspirin EC 81 MG tablet, Take 1 tablet (81 mg total) by mouth daily. Swallow whole., Disp: 30 tablet, Rfl: 12   atorvastatin (LIPITOR) 40 MG tablet, Take 1 tablet (40 mg total) by mouth daily., Disp: 90 tablet, Rfl: 1   augmented betamethasone dipropionate (DIPROLENE-AF) 0.05 % cream, APPLY THIN FILM TO HANDS AND FEET SITES TWICE DAILY UNTIL CLEAR, Disp: , Rfl:    betamethasone dipropionate 0.05 % cream, Apply 1 Application topically daily., Disp: , Rfl:    Cholecalciferol 250 MCG (10000 UT) CAPS, Take 1,000 Units by mouth daily., Disp: , Rfl:    Ciclopirox 1 % shampoo, Apply 1 Application topically 3 (three) times a week., Disp: , Rfl:    esomeprazole (NEXIUM) 40 MG capsule, Take 40 mg by mouth 2 (two) times daily before a meal. , Disp: , Rfl:    fluocinonide (LIDEX) 0.05 % external solution, Apply 1 Application topically 2 (two) times daily., Disp: , Rfl:    fluticasone (FLONASE) 50 MCG/ACT nasal spray, Place 1 spray into both nostrils daily., Disp: , Rfl:    levothyroxine (SYNTHROID) 88 MCG tablet, Take 88 mcg by mouth daily before breakfast., Disp: , Rfl:    losartan (COZAAR) 25 MG tablet, Take 0.5 tablets (12.5 mg total) by mouth daily., Disp: 30 tablet, Rfl: 1    meclizine (ANTIVERT) 25 MG tablet, Take 1 tablet (25 mg total) by mouth 3 (three) times daily as needed for dizziness., Disp: 20 tablet, Rfl: 0   metoprolol succinate (TOPROL-XL) 25 MG 24 hr tablet, Take 1 tablet (25 mg total) by mouth every evening., Disp: 30 tablet, Rfl: 1   montelukast (SINGULAIR) 10 MG tablet, Take 1 tablet by mouth at bedtime., Disp: , Rfl:    nitroGLYCERIN (NITROSTAT) 0.4 MG SL tablet, Place 1 tablet (0.4 mg total) under the tongue every 5 (five) minutes as needed for chest pain., Disp: 30 tablet, Rfl: 12   sertraline (ZOLOFT) 100 MG tablet, Take 150 mg by mouth daily. , Disp: , Rfl:    tacrolimus (PROTOPIC) 0.1 % ointment, Apply 1 Application topically 2 (two) times daily., Disp: , Rfl:   Past Medical History: Past Medical History:  Diagnosis Date   Abnormal liver function test    Anxiety    Arthritis    Asthma    Barrett esophagus    Depression    First degree AV block    GERD (gastroesophageal reflux disease)    Hypothyroidism    Post ablation   Kidney stones    Migraines    PONV (postoperative nausea and vomiting)    Restless leg    Sleep apnea    NO cpap at this time, broken   Tenosynovitis    Thyroid nodule    Vitamin D deficiency  Tobacco Use: Social History   Tobacco Use  Smoking Status Never  Smokeless Tobacco Never    Labs: Review Flowsheet       Latest Ref Rng & Units 12/14/2022  Labs for ITP Cardiac and Pulmonary Rehab  Cholestrol 0 - 200 mg/dL 191   LDL (calc) 0 - 99 mg/dL 76   HDL-C >47 mg/dL 43   Trlycerides <829 mg/dL 562      Exercise Target Goals: Exercise Program Goal: Individual exercise prescription set using results from initial 6 min walk test and THRR while considering  patient's activity barriers and safety.   Exercise Prescription Goal: Initial exercise prescription builds to 30-45 minutes a day of aerobic activity, 2-3 days per week.  Home exercise guidelines will be given to patient during program as part of  exercise prescription that the participant will acknowledge.   Education: Aerobic Exercise: - Group verbal and visual presentation on the components of exercise prescription. Introduces F.I.T.T principle from ACSM for exercise prescriptions.  Reviews F.I.T.T. principles of aerobic exercise including progression. Written material given at graduation. Flowsheet Row Cardiac Rehab from 01/03/2023 in [facility] Cardiac and Pulmonary Rehab  Education need identified 01/03/23       Education: Resistance Exercise: - Group verbal and visual presentation on the components of exercise prescription. Introduces F.I.T.T principle from ACSM for exercise prescriptions  Reviews F.I.T.T. principles of resistance exercise including progression. Written material given at graduation.    Education: Exercise & Equipment Safety: - Individual verbal instruction and demonstration of equipment use and safety with use of the equipment. Flowsheet Row Cardiac Rehab from 12/27/2022 in [facility] Cardiac and Pulmonary Rehab  Date 12/27/22  Educator [facility]  Instruction Review Code 1- Verbalizes Understanding       Education: Exercise Physiology & General Exercise Guidelines: - Group verbal and written instruction with models to review the exercise physiology of the cardiovascular system and associated critical values. Provides general exercise guidelines with specific guidelines to those with heart or lung disease.    Education: Flexibility, Balance, Mind/Body Relaxation: - Group verbal and visual presentation with interactive activity on the components of exercise prescription. Introduces F.I.T.T principle from ACSM for exercise prescriptions. Reviews F.I.T.T. principles of flexibility and balance exercise training including progression. Also discusses the mind body connection.  Reviews various relaxation techniques to help reduce and manage stress (i.e. Deep breathing, progressive muscle relaxation, and visualization). Balance handout  provided to take home. Written material given at graduation.   Activity Barriers & Risk Stratification:  Activity Barriers & Cardiac Risk Stratification - 01/03/23 1700       Activity Barriers & Cardiac Risk Stratification   Activity Barriers Deconditioning;Muscular Weakness;Shortness of Breath;Joint Problems;Other (comment);Arthritis;Balance Concerns    Comments no cartilidge in bilateral knees, tendonitis both wrist and elbows, hip pain, hx left rotator cuff repair    Cardiac Risk Stratification Moderate             6 Minute Walk:  6 Minute Walk     Row Name 01/03/23 1703         6 Minute Walk   Phase Initial     Distance 585 feet     Walk Time 4.06 minutes     # of Rest Breaks 1  2:54-4:48     MPH 1.63     METS 1.03     RPE 11     Perceived Dyspnea  1     VO2 Peak 3.62     Symptoms Yes (comment)  Comments left sided hip pain 2/10, SOB     Resting HR 81 bpm     Resting BP 124/78     Resting Oxygen Saturation  97 %     Exercise Oxygen Saturation  during 6 min walk 96 %     Max Ex. HR 121 bpm     Max Ex. BP 162/84     2 Minute Post BP 130/80              Oxygen Initial Assessment:   Oxygen Re-Evaluation:   Oxygen Discharge (Final Oxygen Re-Evaluation):   Initial Exercise Prescription:  Initial Exercise Prescription - 01/03/23 1700       Date of Initial Exercise RX and Referring Provider   Date 01/03/23    Referring Provider End, Cristal Deer MD      Oxygen   Maintain Oxygen Saturation 88% or higher      T5 Nustep   Level 1    SPM 80    Minutes 15    METs 1      Biostep-RELP   Level 1    SPM 50    Minutes 15    METs 1      Track   Laps 9    Minutes 15    METs 1.49      Prescription Details   Frequency (times per week) 3    Duration Progress to 30 minutes of continuous aerobic without signs/symptoms of physical distress      Intensity   THRR 40-80% of Max Heartrate 117 - 153    Ratings of Perceived Exertion 11-13     Perceived Dyspnea 0-4      Progression   Progression Continue to progress workloads to maintain intensity without signs/symptoms of physical distress.      Resistance Training   Training Prescription Yes    Weight 3 lb    Reps 10-15             Perform Capillary Blood Glucose checks as needed.  Exercise Prescription Changes:   Exercise Prescription Changes     Row Name 01/03/23 1700             Response to Exercise   Blood Pressure (Admit) 124/78       Blood Pressure (Exercise) 162/84       Blood Pressure (Exit) 130/80       Heart Rate (Admit) 81 bpm       Heart Rate (Exercise) 121 bpm       Heart Rate (Exit) 85 bpm       Oxygen Saturation (Admit) 97 %       Oxygen Saturation (Exercise) 96 %       Oxygen Saturation (Exit) 97 %       Rating of Perceived Exertion (Exercise) 11       Perceived Dyspnea (Exercise) 1       Symptoms left sided hip pain 2/10, SOB       Comments walk test results                Exercise Comments:   Exercise Goals and Review:   Exercise Goals     Row Name 01/03/23 1713             Exercise Goals   Increase Physical Activity Yes       Intervention Provide advice, education, support and counseling about physical activity/exercise needs.;Develop an individualized exercise prescription for aerobic and resistive training based on initial evaluation  findings, risk stratification, comorbidities and participant's personal goals.       Expected Outcomes Short Term: Attend rehab on a regular basis to increase amount of physical activity.;Long Term: Add in home exercise to make exercise part of routine and to increase amount of physical activity.;Long Term: Exercising regularly at least 3-5 days a week.       Increase Strength and Stamina Yes       Intervention Provide advice, education, support and counseling about physical activity/exercise needs.;Develop an individualized exercise prescription for aerobic and resistive training based  on initial evaluation findings, risk stratification, comorbidities and participant's personal goals.       Expected Outcomes Short Term: Increase workloads from initial exercise prescription for resistance, speed, and METs.;Short Term: Perform resistance training exercises routinely during rehab and add in resistance training at home;Long Term: Improve cardiorespiratory fitness, muscular endurance and strength as measured by increased METs and functional capacity ( )       Able to understand and use rate of perceived exertion (RPE) scale Yes       Intervention Provide education and explanation on how to use RPE scale       Expected Outcomes Short Term: Able to use RPE daily in rehab to express subjective intensity level;Long Term:  Able to use RPE to guide intensity level when exercising independently       Able to understand and use Dyspnea scale Yes       Intervention Provide education and explanation on how to use Dyspnea scale       Expected Outcomes Short Term: Able to use Dyspnea scale daily in rehab to express subjective sense of shortness of breath during exertion;Long Term: Able to use Dyspnea scale to guide intensity level when exercising independently       Knowledge and understanding of Target Heart Rate Range (THRR) Yes       Intervention Provide education and explanation of THRR including how the numbers were predicted and where they are located for reference       Expected Outcomes Short Term: Able to state/look up THRR;Long Term: Able to use THRR to govern intensity when exercising independently;Short Term: Able to use daily as guideline for intensity in rehab       Able to check pulse independently Yes       Intervention Provide education and demonstration on how to check pulse in carotid and radial arteries.;Review the importance of being able to check your own pulse for safety during independent exercise       Expected Outcomes Long Term: Able to check pulse independently and  accurately;Short Term: Able to explain why pulse checking is important during independent exercise       Understanding of Exercise Prescription Yes       Intervention Provide education, explanation, and written materials on patient's individual exercise prescription       Expected Outcomes Short Term: Able to explain program exercise prescription;Long Term: Able to explain home exercise prescription to exercise independently                Exercise Goals Re-Evaluation :   Discharge Exercise Prescription (Final Exercise Prescription Changes):  Exercise Prescription Changes - 01/03/23 1700       Response to Exercise   Blood Pressure (Admit) 124/78    Blood Pressure (Exercise) 162/84    Blood Pressure (Exit) 130/80    Heart Rate (Admit) 81 bpm    Heart Rate (Exercise) 121 bpm    Heart Rate (Exit)  85 bpm    Oxygen Saturation (Admit) 97 %    Oxygen Saturation (Exercise) 96 %    Oxygen Saturation (Exit) 97 %    Rating of Perceived Exertion (Exercise) 11    Perceived Dyspnea (Exercise) 1    Symptoms left sided hip pain 2/10, SOB    Comments walk test results             Nutrition:  Target Goals: Understanding of nutrition guidelines, daily intake of sodium 1500mg , cholesterol 200mg , calories 30% from fat and 7% or less from saturated fats, daily to have 5 or more servings of fruits and vegetables.  Education: All About Nutrition: -Group instruction provided by verbal, written material, interactive activities, discussions, models, and posters to present general guidelines for heart healthy nutrition including fat, fiber, MyPlate, the role of sodium in heart healthy nutrition, utilization of the nutrition label, and utilization of this knowledge for meal planning. Follow up email sent as well. Written material given at graduation. Flowsheet Row Cardiac Rehab from 01/03/2023 in [facility] Cardiac and Pulmonary Rehab  Education need identified 01/03/23       Biometrics:  Pre  Biometrics - 01/03/23 1701       Pre Biometrics   Height 5' 9.75" (1.772 m)    Weight 442 lb 3.2 oz (200.6 kg)    BMI (Calculated) 63.88    Single Leg Stand 2.03 seconds              Nutrition Therapy Plan and Nutrition Goals:  Nutrition Therapy & Goals - 01/03/23 1651       Nutrition Therapy   RD appointment deferred Yes   Deferred- enrolled in a group at [facility] that includes a dietician     Intervention Plan   Intervention Prescribe, educate and counsel regarding individualized specific dietary modifications aiming towards targeted core components such as weight, hypertension, lipid management, diabetes, heart failure and other comorbidities.    Expected Outcomes Short Term Goal: Understand basic principles of dietary content, such as calories, fat, sodium, cholesterol and nutrients.;Short Term Goal: A plan has been developed with personal nutrition goals set during dietitian appointment.;Long Term Goal: Adherence to prescribed nutrition plan.             Nutrition Assessments:  MEDIFICTS Score Key: ?70 Need to make dietary changes  40-70 Heart Healthy Diet ? 40 Therapeutic Level Cholesterol Diet  Flowsheet Row Cardiac Rehab from 01/03/2023 in [facility] Cardiac and Pulmonary Rehab  Picture Your Plate Total Score on Admission 63      Picture Your Plate Scores: <16 Unhealthy dietary pattern with much room for improvement. 41-50 Dietary pattern unlikely to meet recommendations for good health and room for improvement. 51-60 More healthful dietary pattern, with some room for improvement.  >60 Healthy dietary pattern, although there may be some specific behaviors that could be improved.    Nutrition Goals Re-Evaluation:   Nutrition Goals Discharge (Final Nutrition Goals Re-Evaluation):   Psychosocial: Target Goals: Acknowledge presence or absence of significant depression and/or stress, maximize coping skills, provide positive support system. Participant is able to  verbalize types and ability to use techniques and skills needed for reducing stress and depression.   Education: Stress, Anxiety, and Depression - Group verbal and visual presentation to define topics covered.  Reviews how body is impacted by stress, anxiety, and depression.  Also discusses healthy ways to reduce stress and to treat/manage anxiety and depression.  Written material given at graduation.   Education: Sleep Hygiene -Provides group verbal  and written instruction about how sleep can affect your health.  Define sleep hygiene, discuss sleep cycles and impact of sleep habits. Review good sleep hygiene tips.    Initial Review & Psychosocial Screening:  Initial Psych Review & Screening - 12/27/22 1427       Initial Review   Current issues with Current Psychotropic Meds;Current Depression;Current Anxiety/Panic;Current Stress Concerns    Source of Stress Concerns Family      Family Dynamics   Good Support System? Yes   lives with sister, parents live nearby too, Son     Barriers   Psychosocial barriers to participate in program The patient should benefit from training in stress management and relaxation.;Psychosocial barriers identified (see note)      Screening Interventions   Interventions Encouraged to exercise;Provide feedback about the scores to participant;To provide support and resources with identified psychosocial needs    Expected Outcomes Short Term goal: Utilizing psychosocial counselor, staff and physician to assist with identification of specific Stressors or current issues interfering with healing process. Setting desired goal for each stressor or current issue identified.;Long Term Goal: Stressors or current issues are controlled or eliminated.;Short Term goal: Identification and review with participant of any Quality of Life or Depression concerns found by scoring the questionnaire.;Long Term goal: The participant improves quality of Life and PHQ9 Scores as seen by post  scores and/or verbalization of changes             Quality of Life Scores:   Quality of Life - 01/03/23 1651       Quality of Life   Select Quality of Life      Quality of Life Scores   Health/Function Pre 17.2 %    Socioeconomic Pre 23.63 %    Psych/Spiritual Pre 17.79 %    Family Pre 19.2 %    GLOBAL Pre 19.07 %            Scores of 19 and below usually indicate a poorer quality of life in these areas.  A difference of  2-3 points is a clinically meaningful difference.  A difference of 2-3 points in the total score of the Quality of Life Index has been associated with significant improvement in overall quality of life, self-image, physical symptoms, and general health in studies assessing change in quality of life.  PHQ-9: Review Flowsheet       01/03/2023  Depression screen PHQ 2/9  Decreased Interest 0  Down, Depressed, Hopeless 1  PHQ - 2 Score 1  Altered sleeping 0  Tired, decreased energy 2  Change in appetite 0  Feeling bad or failure about yourself  0  Trouble concentrating 0  Moving slowly or fidgety/restless 0  Suicidal thoughts 0  PHQ-9 Score 3  Difficult doing work/chores Somewhat difficult   Interpretation of Total Score  Total Score Depression Severity:  1-4 = Minimal depression, 5-9 = Mild depression, 10-14 = Moderate depression, 15-19 = Moderately severe depression, 20-27 = Severe depression   Psychosocial Evaluation and Intervention:  Psychosocial Evaluation - 01/03/23 1658       Psychosocial Evaluation & Interventions   Comments Patient states she feels well mentally at this time compared to how she used to feel about 10 years ago when she was in a "darker" place. 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 She is excited to start the program and denies any other  SI concerns at this time. Encouraged patient to notify if anything changes or  starts to feel worse.             Psychosocial Re-Evaluation:   Psychosocial Discharge (Final Psychosocial Re-Evaluation):   Vocational Rehabilitation: Provide vocational rehab assistance to qualifying candidates.   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 Participant will state understanding/return demonstration of topics presented as noted by education test scores.  Learning Barriers/Preferences:  Learning Barriers/Preferences - 12/27/22 1425       Learning Barriers/Preferences   Learning Barriers Sight   glasses   Learning Preferences Group Instruction;Individual Instruction;Skilled Demonstration;Verbal Instruction;Written Material             General Cardiac Education Topics:  AED/CPR: - Group verbal and written instruction with the use of models to demonstrate the basic use of the AED with the basic ABC's of resuscitation.   Anatomy and Cardiac Procedures: - Group verbal and visual presentation and models provide information about basic cardiac anatomy and function. Reviews the testing methods done to diagnose heart disease and the outcomes of the test results. Describes the treatment choices: Medical Management, Angioplasty, or Coronary Bypass Surgery for treating various heart conditions including Myocardial Infarction, Angina, Valve Disease, and Cardiac Arrhythmias.  Written material given at graduation. Flowsheet Row Cardiac Rehab from 01/03/2023 in [facility] Cardiac and Pulmonary Rehab  Education need identified 01/03/23       Medication Safety: - Group verbal and visual instruction to  review commonly prescribed medications for heart and lung disease. Reviews the medication, class of the drug, and side effects. Includes the steps to properly store meds and maintain the prescription regimen.  Written material given at graduation.   Intimacy: - Group verbal instruction through game format to discuss how heart and lung disease can affect sexual intimacy. Written material given at graduation.. Flowsheet Row Cardiac Rehab from 01/03/2023 in [facility] Cardiac and Pulmonary Rehab  Education need identified 01/03/23       Know Your Numbers and Heart Failure: - Group verbal and visual instruction to discuss disease risk factors for cardiac and pulmonary disease and treatment options.  Reviews associated critical values for Overweight/Obesity, Hypertension, Cholesterol, and Diabetes.  Discusses basics of heart failure: signs/symptoms and treatments.  Introduces Heart Failure Zone chart for action plan for heart failure.  Written material given at graduation.   Infection Prevention: - Provides verbal and written material to individual with discussion of infection control including proper hand washing and proper equipment cleaning during exercise session. Flowsheet Row Cardiac Rehab from 12/27/2022 in [facility] Cardiac and Pulmonary Rehab  Date 12/27/22  Educator [facility]  Instruction Review Code 1- Verbalizes Understanding       Falls Prevention: - Provides verbal and written material to individual with discussion of falls prevention and safety. Flowsheet Row Cardiac Rehab from 12/27/2022 in [facility] Cardiac and Pulmonary Rehab  Date 12/27/22  Educator [facility]  Instruction Review Code 1- Verbalizes Understanding       Other: -Provides group and verbal instruction on various topics (see comments)   Knowledge Questionnaire Score:  Knowledge Questionnaire Score - 01/03/23 1651  Knowledge Questionnaire Score   Pre Score 23/26             Core Components/Risk Factors/Patient Goals at  Admission:  Personal Goals and Risk Factors at Admission - 01/03/23 1714       Core Components/Risk Factors/Patient Goals on Admission    Weight Management Yes;Obesity;Weight Loss    Intervention Weight Management: Develop a combined nutrition and exercise program designed to reach desired caloric intake, while maintaining appropriate intake of nutrient and fiber, sodium and fats, and appropriate energy expenditure required for the weight goal.;Weight Management: Provide education and appropriate resources to help participant work on and attain dietary goals.;Weight Management/Obesity: Establish reasonable short term and long term weight goals.;Obesity: Provide education and appropriate resources to help participant work on and attain dietary goals.    Admit Weight 442 lb (200.5 kg)    Goal Weight: Short Term 436 lb (197.8 kg)    Goal Weight: Long Term 200 lb (90.7 kg)   per patient goal   Expected Outcomes Short Term: Continue to assess and modify interventions until short term weight is achieved;Long Term: Adherence to nutrition and physical activity/exercise program aimed toward attainment of established weight goal;Weight Loss: Understanding of general recommendations for a balanced deficit meal plan, which promotes 1-2 lb weight loss per week and includes a negative energy balance of [phone] kcal/d;Understanding recommendations for meals to include 15-35% energy as protein, 25-35% energy from fat, 35-60% energy from carbohydrates, less than 200mg  of dietary cholesterol, 20-35 gm of total fiber daily;Understanding of distribution of calorie intake throughout the day with the consumption of 4-5 meals/snacks    Hypertension Yes    Intervention Provide education on lifestyle modifcations including regular physical activity/exercise, weight management, moderate sodium restriction and increased consumption of fresh fruit, vegetables, and low fat dairy, alcohol moderation, and smoking cessation.;Monitor  prescription use compliance.    Expected Outcomes Short Term: Continued assessment and intervention until BP is < 140/72mm HG in hypertensive participants. < 130/45mm HG in hypertensive participants with diabetes, heart failure or chronic kidney disease.;Long Term: Maintenance of blood pressure at goal levels.    Lipids Yes    Intervention Provide education and support for participant on nutrition & aerobic/resistive exercise along with prescribed medications to achieve LDL 70mg , HDL >40mg .    Expected Outcomes Short Term: Participant states understanding of desired cholesterol values and is compliant with medications prescribed. Participant is following exercise prescription and nutrition guidelines.;Long Term: Cholesterol controlled with medications as prescribed, with individualized exercise RX and with personalized nutrition plan. Value goals: LDL < 70mg , HDL > 40 mg.             Education:Diabetes - Individual verbal and written instruction to review signs/symptoms of diabetes, desired ranges of glucose level fasting, after meals and with exercise. Acknowledge that pre and post exercise glucose checks will be done for 3 sessions at entry of program.   Core Components/Risk Factors/Patient Goals Review:    Core Components/Risk Factors/Patient Goals at Discharge (Final Review):    ITP Comments:  ITP Comments     Row Name 12/27/22 1441 01/03/23 1649         ITP Comments Completed virtual orientation today.  EP evaluation is scheduled for Wed 5/8 at 230.  Documentation for diagnosis can be found in [facility] encounter 12/12/22. Completed and gym orientation. Initial ITP created and sent for review to Dr. Bethann Punches, Medical Director. Provided handouts on stress management, breathing techniques and progressive muscular relaxation. Reviewed education on stress  management as that is patient's biggest concern at this time- talked about importance on control.               Comments:  Initial ITP

## 2023-01-03 NOTE — Patient Instructions (Signed)
Patient Instructions  Patient Details  Name: Claudia Campbell MRN: [id number] Date of Birth: August 28, 1971 Referring Provider:  [name], MD  Below are your personal goals for exercise, nutrition, and risk factors. Our goal is to help you stay on track towards obtaining and maintaining these goals. We will be discussing your progress on these goals with you throughout the program.  Initial Exercise Prescription:  Initial Exercise Prescription - 01/03/23 1700       Date of Initial Exercise RX and Referring Provider   Date 01/03/23    Referring Provider End, Cristal Deer MD      Oxygen   Maintain Oxygen Saturation 88% or higher      T5 Nustep   Level 1    SPM 80    Minutes 15    METs 1      Biostep-RELP   Level 1    SPM 50    Minutes 15    METs 1      Track   Laps 9    Minutes 15    METs 1.49      Prescription Details   Frequency (times per week) 3    Duration Progress to 30 minutes of continuous aerobic without signs/symptoms of physical distress      Intensity   THRR 40-80% of Max Heartrate 117 - 153    Ratings of Perceived Exertion 11-13    Perceived Dyspnea 0-4      Progression   Progression Continue to progress workloads to maintain intensity without signs/symptoms of physical distress.      Resistance Training   Training Prescription Yes    Weight 3 lb    Reps 10-15             Exercise Goals: Frequency: Be able to perform aerobic exercise two to three times per week in program working toward 2-5 days per week of home exercise.  Intensity: Work with a perceived exertion of 11 (fairly light) - 15 (hard) while following your exercise prescription.  We will make changes to your prescription with you as you progress through the program.   Duration: Be able to do 30 to 45 minutes of continuous aerobic exercise in addition to a 5 minute warm-up and a 5 minute cool-down routine.   Nutrition Goals: Your personal nutrition goals will be established when  you do your nutrition analysis with the dietician.  The following are general nutrition guidelines to follow: Cholesterol < 200mg /day Sodium < 1500mg /day Fiber: Women under 50 yrs - 25 grams per day  Personal Goals:  Personal Goals and Risk Factors at Admission - 01/03/23 1714       Core Components/Risk Factors/Patient Goals on Admission    Weight Management Yes;Obesity;Weight Loss    Intervention Weight Management: Develop a combined nutrition and exercise program designed to reach desired caloric intake, while maintaining appropriate intake of nutrient and fiber, sodium and fats, and appropriate energy expenditure required for the weight goal.;Weight Management: Provide education and appropriate resources to help participant work on and attain dietary goals.;Weight Management/Obesity: Establish reasonable short term and long term weight goals.;Obesity: Provide education and appropriate resources to help participant work on and attain dietary goals.    Admit Weight 442 lb (200.5 kg)    Goal Weight: Short Term 436 lb (197.8 kg)    Goal Weight: Long Term 200 lb (90.7 kg)   per patient goal   Expected Outcomes Short Term: Continue to assess and modify interventions until short term weight is achieved;Long  Term: Adherence to nutrition and physical activity/exercise program aimed toward attainment of established weight goal;Weight Loss: Understanding of general recommendations for a balanced deficit meal plan, which promotes 1-2 lb weight loss per week and includes a negative energy balance of [phone] kcal/d;Understanding recommendations for meals to include 15-35% energy as protein, 25-35% energy from fat, 35-60% energy from carbohydrates, less than 200mg  of dietary cholesterol, 20-35 gm of total fiber daily;Understanding of distribution of calorie intake throughout the day with the consumption of 4-5 meals/snacks    Hypertension Yes    Intervention Provide education on lifestyle modifcations  including regular physical activity/exercise, weight management, moderate sodium restriction and increased consumption of fresh fruit, vegetables, and low fat dairy, alcohol moderation, and smoking cessation.;Monitor prescription use compliance.    Expected Outcomes Short Term: Continued assessment and intervention until BP is < 140/41mm HG in hypertensive participants. < 130/50mm HG in hypertensive participants with diabetes, heart failure or chronic kidney disease.;Long Term: Maintenance of blood pressure at goal levels.    Lipids Yes    Intervention Provide education and support for participant on nutrition & aerobic/resistive exercise along with prescribed medications to achieve LDL 70mg , HDL >40mg .    Expected Outcomes Short Term: Participant states understanding of desired cholesterol values and is compliant with medications prescribed. Participant is following exercise prescription and nutrition guidelines.;Long Term: Cholesterol controlled with medications as prescribed, with individualized exercise RX and with personalized nutrition plan. Value goals: LDL < 70mg , HDL > 40 mg.             Tobacco Use Initial Evaluation: Social History   Tobacco Use  Smoking Status Never  Smokeless Tobacco Never    Exercise Goals and Review:  Exercise Goals     Row Name 01/03/23 1713             Exercise Goals   Increase Physical Activity Yes       Intervention Provide advice, education, support and counseling about physical activity/exercise needs.;Develop an individualized exercise prescription for aerobic and resistive training based on initial evaluation findings, risk stratification, comorbidities and participant's personal goals.       Expected Outcomes Short Term: Attend rehab on a regular basis to increase amount of physical activity.;Long Term: Add in home exercise to make exercise part of routine and to increase amount of physical activity.;Long Term: Exercising regularly at least  3-5 days a week.       Increase Strength and Stamina Yes       Intervention Provide advice, education, support and counseling about physical activity/exercise needs.;Develop an individualized exercise prescription for aerobic and resistive training based on initial evaluation findings, risk stratification, comorbidities and participant's personal goals.       Expected Outcomes Short Term: Increase workloads from initial exercise prescription for resistance, speed, and METs.;Short Term: Perform resistance training exercises routinely during rehab and add in resistance training at home;Long Term: Improve cardiorespiratory fitness, muscular endurance and strength as measured by increased METs and functional capacity ( )       Able to understand and use rate of perceived exertion (RPE) scale Yes       Intervention Provide education and explanation on how to use RPE scale       Expected Outcomes Short Term: Able to use RPE daily in rehab to express subjective intensity level;Long Term:  Able to use RPE to guide intensity level when exercising independently       Able to understand and use Dyspnea scale Yes  Intervention Provide education and explanation on how to use Dyspnea scale       Expected Outcomes Short Term: Able to use Dyspnea scale daily in rehab to express subjective sense of shortness of breath during exertion;Long Term: Able to use Dyspnea scale to guide intensity level when exercising independently       Knowledge and understanding of Target Heart Rate Range (THRR) Yes       Intervention Provide education and explanation of THRR including how the numbers were predicted and where they are located for reference       Expected Outcomes Short Term: Able to state/look up THRR;Long Term: Able to use THRR to govern intensity when exercising independently;Short Term: Able to use daily as guideline for intensity in rehab       Able to check pulse independently Yes       Intervention Provide  education and demonstration on how to check pulse in carotid and radial arteries.;Review the importance of being able to check your own pulse for safety during independent exercise       Expected Outcomes Long Term: Able to check pulse independently and accurately;Short Term: Able to explain why pulse checking is important during independent exercise       Understanding of Exercise Prescription Yes       Intervention Provide education, explanation, and written materials on patient's individual exercise prescription       Expected Outcomes Short Term: Able to explain program exercise prescription;Long Term: Able to explain home exercise prescription to exercise independently                Copy of goals given to participant.

## 2023-01-04 ENCOUNTER — Encounter: Payer: BC Managed Care – PPO | Admitting: *Deleted

## 2023-01-04 DIAGNOSIS — E119 Type 2 diabetes mellitus without complications: Secondary | ICD-10-CM | POA: Diagnosis not present

## 2023-01-04 DIAGNOSIS — I21A1 Myocardial infarction type 2: Secondary | ICD-10-CM

## 2023-01-04 NOTE — Progress Notes (Signed)
Daily Session Note  Patient Details  Name: Claudia Campbell MRN: [id number] Date of Birth: August 08, 1971 Referring Provider:   Flowsheet Row Cardiac Rehab from 01/03/2023 in [facility] Cardiac and Pulmonary Rehab  Referring Provider End, Cristal Deer MD       Encounter Date: 01/04/2023  Check In:  Session Check In - 01/04/23 1713       Check-In   Supervising physician immediately available to respond to emergencies See telemetry face sheet for immediately available ER MD    Location [facility]-Cardiac & Pulmonary Rehab    Staff Present Susann Givens, RN BSN;Joseph Altamont, RCP,RRT,BSRT;Noah Grayson, [place], Exercise Physiologist;Kara Clinton Sawyer, MS, ACSM CEP, Exercise Physiologist    Virtual Visit No    Medication changes reported     No    Fall or balance concerns reported    No    Warm-up and Cool-down Performed on first and last piece of equipment    Resistance Training Performed Yes    VAD Patient? No    PAD/SET Patient? No      Pain Assessment   Currently in Pain? No/denies                Social History   Tobacco Use  Smoking Status Never  Smokeless Tobacco Never    Goals Met:  Independence with exercise equipment Exercise tolerated well No report of concerns or symptoms today Strength training completed today  Goals Unmet:  Not Applicable  Comments: First full day of exercise!  Patient was oriented to gym and equipment including functions, settings, policies, and procedures.  Patient's individual exercise prescription and treatment plan were reviewed.  All starting workloads were established based on the results of the 6 minute walk test done at initial orientation visit.  The plan for exercise progression was also introduced and progression will be customized based on patient's performance and goals.    Dr. Bethann Punches is Medical Director for [facility] Cardiac Rehabilitation.  Dr. Vida Rigger is Medical Director for [facility] Pulmonary Rehabilitation.

## 2023-01-08 ENCOUNTER — Encounter: Payer: BC Managed Care – PPO | Admitting: *Deleted

## 2023-01-08 DIAGNOSIS — I21A1 Myocardial infarction type 2: Secondary | ICD-10-CM

## 2023-01-08 DIAGNOSIS — Z79899 Other long term (current) drug therapy: Secondary | ICD-10-CM

## 2023-01-08 DIAGNOSIS — E119 Type 2 diabetes mellitus without complications: Secondary | ICD-10-CM | POA: Diagnosis not present

## 2023-01-08 NOTE — Progress Notes (Signed)
Daily Session Note  Patient Details  Name: Claudia Campbell MRN: [id number] Date of Birth: [birth year] Referring Provider:   Flowsheet Row Cardiac Rehab from 01/03/2023 in [facility] Cardiac and Pulmonary Rehab  Referring Provider End, Cristal Deer MD       Encounter Date: 01/08/2023  Check In:  Session Check In - 01/08/23 1740       Check-In   Supervising physician immediately available to respond to emergencies See telemetry face sheet for immediately available ER MD    Location [facility]-Cardiac & Pulmonary Rehab    Staff Present Lanny Hurst, RN, ADN;Joseph Reino Kent, RCP,RRT,BSRT;Noah Tickle, BS, Exercise Physiologist    Virtual Visit No    Medication changes reported     No    Fall or balance concerns reported    No    Warm-up and Cool-down Performed on first and last piece of equipment    Resistance Training Performed Yes    VAD Patient? No    PAD/SET Patient? No      Pain Assessment   Currently in Pain? No/denies                Social History   Tobacco Use  Smoking Status Never  Smokeless Tobacco Never    Goals Met:  Independence with exercise equipment Exercise tolerated well No report of concerns or symptoms today Strength training completed today  Goals Unmet:  Not Applicable  Comments: Pt able to follow exercise prescription today without complaint.  Will continue to monitor for progression.    Dr. Bethann Punches is Medical Director for [facility] Cardiac Rehabilitation.  Dr. Vida Rigger is Medical Director for [facility] Pulmonary Rehabilitation.

## 2023-01-09 NOTE — Telephone Encounter (Signed)
Please let [name] know I have reviewed her blood pressure readings.  I recommend that we increase losartan to 25 mg daily to help better control her blood pressure.  We should recheck a basic metabolic panel in about 2 weeks.  [name], MD [facility]

## 2023-01-10 ENCOUNTER — Encounter: Payer: BC Managed Care – PPO | Admitting: *Deleted

## 2023-01-10 ENCOUNTER — Encounter: Payer: Self-pay | Admitting: *Deleted

## 2023-01-10 DIAGNOSIS — I21A1 Myocardial infarction type 2: Secondary | ICD-10-CM

## 2023-01-10 DIAGNOSIS — E119 Type 2 diabetes mellitus without complications: Secondary | ICD-10-CM | POA: Diagnosis not present

## 2023-01-10 MED ORDER — LOSARTAN POTASSIUM 25 MG PO TABS: 25.0000 mg | ORAL_TABLET | Freq: Every day | ORAL | 3 refills | Status: DC

## 2023-01-10 NOTE — Progress Notes (Signed)
Cardiac Individual Treatment Plan  Patient Details  Name: Claudia Campbell MRN: [id number] Date of Birth: 1971/04/21 Referring Provider:   Flowsheet Row Cardiac Rehab from 01/03/2023 in [facility] Cardiac and Pulmonary Rehab  Referring Provider End, Cristal Deer MD       Initial Encounter Date:  Flowsheet Row Cardiac Rehab from 01/03/2023 in [facility] Cardiac and Pulmonary Rehab  Date 01/03/23       Visit Diagnosis: Type 2 MI (myocardial infarction) (HCC)  Patient's Home Medications on Admission:  Current Outpatient Medications:    albuterol (VENTOLIN HFA) 108 (90 Base) MCG/ACT inhaler, Inhale 2 puffs into the lungs every 6 (six) hours as needed for wheezing or shortness of breath., Disp: , Rfl:    aspirin EC 81 MG tablet, Take 1 tablet (81 mg total) by mouth daily. Swallow whole., Disp: 30 tablet, Rfl: 12   atorvastatin (LIPITOR) 40 MG tablet, Take 1 tablet (40 mg total) by mouth daily., Disp: 90 tablet, Rfl: 1   augmented betamethasone dipropionate (DIPROLENE-AF) 0.05 % cream, APPLY THIN FILM TO HANDS AND FEET SITES TWICE DAILY UNTIL CLEAR, Disp: , Rfl:    betamethasone dipropionate 0.05 % cream, Apply 1 Application topically daily., Disp: , Rfl:    Cholecalciferol 250 MCG (10000 UT) CAPS, Take 1,000 Units by mouth daily., Disp: , Rfl:    Ciclopirox 1 % shampoo, Apply 1 Application topically 3 (three) times a week., Disp: , Rfl:    esomeprazole (NEXIUM) 40 MG capsule, Take 40 mg by mouth 2 (two) times daily before a meal. , Disp: , Rfl:    fluocinonide (LIDEX) 0.05 % external solution, Apply 1 Application topically 2 (two) times daily., Disp: , Rfl:    fluticasone (FLONASE) 50 MCG/ACT nasal spray, Place 1 spray into both nostrils daily., Disp: , Rfl:    levothyroxine (SYNTHROID) 88 MCG tablet, Take 88 mcg by mouth daily before breakfast., Disp: , Rfl:    losartan (COZAAR) 25 MG tablet, Take 0.5 tablets (12.5 mg total) by mouth daily., Disp: 30 tablet, Rfl: 1   meclizine (ANTIVERT) 25 MG tablet,  Take 1 tablet (25 mg total) by mouth 3 (three) times daily as needed for dizziness., Disp: 20 tablet, Rfl: 0   metoprolol succinate (TOPROL-XL) 25 MG 24 hr tablet, Take 1 tablet (25 mg total) by mouth every evening., Disp: 30 tablet, Rfl: 1   montelukast (SINGULAIR) 10 MG tablet, Take 1 tablet by mouth at bedtime., Disp: , Rfl:    nitroGLYCERIN (NITROSTAT) 0.4 MG SL tablet, Place 1 tablet (0.4 mg total) under the tongue every 5 (five) minutes as needed for chest pain., Disp: 30 tablet, Rfl: 12   sertraline (ZOLOFT) 100 MG tablet, Take 150 mg by mouth daily. , Disp: , Rfl:    tacrolimus (PROTOPIC) 0.1 % ointment, Apply 1 Application topically 2 (two) times daily., Disp: , Rfl:   Past Medical History: Past Medical History:  Diagnosis Date   Abnormal liver function test    Anxiety    Arthritis    Asthma    Barrett esophagus    Depression    First degree AV block    GERD (gastroesophageal reflux disease)    Hypothyroidism    Post ablation   Kidney stones    Migraines    PONV (postoperative nausea and vomiting)    Restless leg    Sleep apnea    NO cpap at this time, broken   Tenosynovitis    Thyroid nodule    Vitamin D deficiency     Tobacco  Use: Social History   Tobacco Use  Smoking Status Never  Smokeless Tobacco Never    Labs: Review Flowsheet       Latest Ref Rng & Units 12/14/2022  Labs for ITP Cardiac and Pulmonary Rehab  Cholestrol 0 - 200 mg/dL 130   LDL (calc) 0 - 99 mg/dL 76   HDL-C >86 mg/dL 43   Trlycerides <578 mg/dL 469      Exercise Target Goals: Exercise Program Goal: Individual exercise prescription set using results from initial 6 min walk test and THRR while considering  patient's activity barriers and safety.   Exercise Prescription Goal: Initial exercise prescription builds to 30-45 minutes a day of aerobic activity, 2-3 days per week.  Home exercise guidelines will be given to patient during program as part of exercise prescription that the  participant will acknowledge.   Education: Aerobic Exercise: - Group verbal and visual presentation on the components of exercise prescription. Introduces F.I.T.T principle from ACSM for exercise prescriptions.  Reviews F.I.T.T. principles of aerobic exercise including progression. Written material given at graduation. Flowsheet Row Cardiac Rehab from 01/03/2023 in [facility] Cardiac and Pulmonary Rehab  Education need identified 01/03/23       Education: Resistance Exercise: - Group verbal and visual presentation on the components of exercise prescription. Introduces F.I.T.T principle from ACSM for exercise prescriptions  Reviews F.I.T.T. principles of resistance exercise including progression. Written material given at graduation.    Education: Exercise & Equipment Safety: - Individual verbal instruction and demonstration of equipment use and safety with use of the equipment. Flowsheet Row Cardiac Rehab from 12/27/2022 in [facility] Cardiac and Pulmonary Rehab  Date 12/27/22  Educator [facility]  Instruction Review Code 1- Verbalizes Understanding       Education: Exercise Physiology & General Exercise Guidelines: - Group verbal and written instruction with models to review the exercise physiology of the cardiovascular system and associated critical values. Provides general exercise guidelines with specific guidelines to those with heart or lung disease.    Education: Flexibility, Balance, Mind/Body Relaxation: - Group verbal and visual presentation with interactive activity on the components of exercise prescription. Introduces F.I.T.T principle from ACSM for exercise prescriptions. Reviews F.I.T.T. principles of flexibility and balance exercise training including progression. Also discusses the mind body connection.  Reviews various relaxation techniques to help reduce and manage stress (i.e. Deep breathing, progressive muscle relaxation, and visualization). Balance handout provided to take home. Written  material given at graduation.   Activity Barriers & Risk Stratification:  Activity Barriers & Cardiac Risk Stratification - 01/03/23 1700       Activity Barriers & Cardiac Risk Stratification   Activity Barriers Deconditioning;Muscular Weakness;Shortness of Breath;Joint Problems;Other (comment);Arthritis;Balance Concerns    Comments no cartilidge in bilateral knees, tendonitis both wrist and elbows, hip pain, hx left rotator cuff repair    Cardiac Risk Stratification Moderate             6 Minute Walk:  6 Minute Walk     Row Name 01/03/23 1703         6 Minute Walk   Phase Initial     Distance 585 feet     Walk Time 4.06 minutes     # of Rest Breaks 1  2:54-4:48     MPH 1.63     METS 1.03     RPE 11     Perceived Dyspnea  1     VO2 Peak 3.62     Symptoms Yes (comment)  Comments left sided hip pain 2/10, SOB     Resting HR 81 bpm     Resting BP 124/78     Resting Oxygen Saturation  97 %     Exercise Oxygen Saturation  during 6 min walk 96 %     Max Ex. HR 121 bpm     Max Ex. BP 162/84     2 Minute Post BP 130/80              Oxygen Initial Assessment:   Oxygen Re-Evaluation:   Oxygen Discharge (Final Oxygen Re-Evaluation):   Initial Exercise Prescription:  Initial Exercise Prescription - 01/03/23 1700       Date of Initial Exercise RX and Referring Provider   Date 01/03/23    Referring Provider End, Cristal Deer MD      Oxygen   Maintain Oxygen Saturation 88% or higher      T5 Nustep   Level 1    SPM 80    Minutes 15    METs 1      Biostep-RELP   Level 1    SPM 50    Minutes 15    METs 1      Track   Laps 9    Minutes 15    METs 1.49      Prescription Details   Frequency (times per week) 3    Duration Progress to 30 minutes of continuous aerobic without signs/symptoms of physical distress      Intensity   THRR 40-80% of Max Heartrate 117 - 153    Ratings of Perceived Exertion 11-13    Perceived Dyspnea 0-4       Progression   Progression Continue to progress workloads to maintain intensity without signs/symptoms of physical distress.      Resistance Training   Training Prescription Yes    Weight 3 lb    Reps 10-15             Perform Capillary Blood Glucose checks as needed.  Exercise Prescription Changes:   Exercise Prescription Changes     Row Name 01/03/23 1700 01/09/23 1400           Response to Exercise   Blood Pressure (Admit) 124/78 126/76      Blood Pressure (Exercise) 162/84 160/84      Blood Pressure (Exit) 130/80 124/86      Heart Rate (Admit) 81 bpm 92 bpm      Heart Rate (Exercise) 121 bpm 114 bpm      Heart Rate (Exit) 85 bpm 91 bpm      Oxygen Saturation (Admit) 97 % --      Oxygen Saturation (Exercise) 96 % --      Oxygen Saturation (Exit) 97 % --      Rating of Perceived Exertion (Exercise) 11 13      Perceived Dyspnea (Exercise) 1 --      Symptoms left sided hip pain 2/10, SOB hip pain      Comments walk test results 2nd full day of exercise      Duration -- Progress to 30 minutes of  aerobic without signs/symptoms of physical distress      Intensity -- THRR unchanged        Progression   Progression -- Continue to progress workloads to maintain intensity without signs/symptoms of physical distress.      Average METs -- 1.9        Resistance Training   Training Prescription -- Yes  Weight -- 3 lb      Reps -- 10-15        Interval Training   Interval Training -- No        T5 Nustep   Level -- 2      Minutes -- 15      METs -- 1.8        Biostep-RELP   Level -- 1      Minutes -- 15      METs -- 2        Oxygen   Maintain Oxygen Saturation -- 88% or higher               Exercise Comments:   Exercise Comments     Row Name 01/04/23 1714           Exercise Comments First full day of exercise!  Patient was oriented to gym and equipment including functions, settings, policies, and procedures.  Patient's individual exercise  prescription and treatment plan were reviewed.  All starting workloads were established based on the results of the 6 minute walk test done at initial orientation visit.  The plan for exercise progression was also introduced and progression will be customized based on patient's performance and goals.                Exercise Goals and Review:   Exercise Goals     Row Name 01/03/23 1713             Exercise Goals   Increase Physical Activity Yes       Intervention Provide advice, education, support and counseling about physical activity/exercise needs.;Develop an individualized exercise prescription for aerobic and resistive training based on initial evaluation findings, risk stratification, comorbidities and participant's personal goals.       Expected Outcomes Short Term: Attend rehab on a regular basis to increase amount of physical activity.;Long Term: Add in home exercise to make exercise part of routine and to increase amount of physical activity.;Long Term: Exercising regularly at least 3-5 days a week.       Increase Strength and Stamina Yes       Intervention Provide advice, education, support and counseling about physical activity/exercise needs.;Develop an individualized exercise prescription for aerobic and resistive training based on initial evaluation findings, risk stratification, comorbidities and participant's personal goals.       Expected Outcomes Short Term: Increase workloads from initial exercise prescription for resistance, speed, and METs.;Short Term: Perform resistance training exercises routinely during rehab and add in resistance training at home;Long Term: Improve cardiorespiratory fitness, muscular endurance and strength as measured by increased METs and functional capacity ( )       Able to understand and use rate of perceived exertion (RPE) scale Yes       Intervention Provide education and explanation on how to use RPE scale       Expected Outcomes Short  Term: Able to use RPE daily in rehab to express subjective intensity level;Long Term:  Able to use RPE to guide intensity level when exercising independently       Able to understand and use Dyspnea scale Yes       Intervention Provide education and explanation on how to use Dyspnea scale       Expected Outcomes Short Term: Able to use Dyspnea scale daily in rehab to express subjective sense of shortness of breath during exertion;Long Term: Able to use Dyspnea scale to guide intensity level when exercising independently  Knowledge and understanding of Target Heart Rate Range (THRR) Yes       Intervention Provide education and explanation of THRR including how the numbers were predicted and where they are located for reference       Expected Outcomes Short Term: Able to state/look up THRR;Long Term: Able to use THRR to govern intensity when exercising independently;Short Term: Able to use daily as guideline for intensity in rehab       Able to check pulse independently Yes       Intervention Provide education and demonstration on how to check pulse in carotid and radial arteries.;Review the importance of being able to check your own pulse for safety during independent exercise       Expected Outcomes Long Term: Able to check pulse independently and accurately;Short Term: Able to explain why pulse checking is important during independent exercise       Understanding of Exercise Prescription Yes       Intervention Provide education, explanation, and written materials on patient's individual exercise prescription       Expected Outcomes Short Term: Able to explain program exercise prescription;Long Term: Able to explain home exercise prescription to exercise independently                Exercise Goals Re-Evaluation :  Exercise Goals Re-Evaluation     Row Name 01/04/23 1715 01/09/23 1502           Exercise Goal Re-Evaluation   Exercise Goals Review Increase Physical Activity;Able to  understand and use rate of perceived exertion (RPE) scale;Knowledge and understanding of Target Heart Rate Range (THRR);Understanding of Exercise Prescription;Increase Strength and Stamina;Able to check pulse independently Increase Physical Activity;Increase Strength and Stamina;Understanding of Exercise Prescription      Comments Reviewed RPE scale, THR and program prescription with pt today.  Pt voiced understanding and was given a copy of goals to take home. Debbi completed 2 sessions od rehab so far. she is quite limited due to some knee and hip pain that she experiences often. We tried out the Biostep and it gave her more hip pain. She did well on the T5Nustep. Her cardiologist provided her with BP restrictions for exercise and will continue to work with Dr. Okey Dupre to figure out where her guidelines will be as it appeared that her BP went beyond her restriction, however, we are then limiting the patient with her exercise as she feels she can do more as she felt good. We are awaiting to hear back from Dr. Okey Dupre on next steps. We will continue to monitor as she progresses in the program.      Expected Outcomes Short: Use RPE daily to regulate intensity.  Long: Follow program prescription in THR. Short: Continue initial exercise prescription, monitor BP closely and hear back from Dr.End Long: Build up overall strength and stamina               Discharge Exercise Prescription (Final Exercise Prescription Changes):  Exercise Prescription Changes - 01/09/23 1400       Response to Exercise   Blood Pressure (Admit) 126/76    Blood Pressure (Exercise) 160/84    Blood Pressure (Exit) 124/86    Heart Rate (Admit) 92 bpm    Heart Rate (Exercise) 114 bpm    Heart Rate (Exit) 91 bpm    Rating of Perceived Exertion (Exercise) 13    Symptoms hip pain    Comments 2nd full day of exercise    Duration Progress to 30  minutes of  aerobic without signs/symptoms of physical distress    Intensity THRR unchanged       Progression   Progression Continue to progress workloads to maintain intensity without signs/symptoms of physical distress.    Average METs 1.9      Resistance Training   Training Prescription Yes    Weight 3 lb    Reps 10-15      Interval Training   Interval Training No      T5 Nustep   Level 2    Minutes 15    METs 1.8      Biostep-RELP   Level 1    Minutes 15    METs 2      Oxygen   Maintain Oxygen Saturation 88% or higher             Nutrition:  Target Goals: Understanding of nutrition guidelines, daily intake of sodium 1500mg , cholesterol 200mg , calories 30% from fat and 7% or less from saturated fats, daily to have 5 or more servings of fruits and vegetables.  Education: All About Nutrition: -Group instruction provided by verbal, written material, interactive activities, discussions, models, and posters to present general guidelines for heart healthy nutrition including fat, fiber, MyPlate, the role of sodium in heart healthy nutrition, utilization of the nutrition label, and utilization of this knowledge for meal planning. Follow up email sent as well. Written material given at graduation. Flowsheet Row Cardiac Rehab from 01/03/2023 in [facility] Cardiac and Pulmonary Rehab  Education need identified 01/03/23       Biometrics:  Pre Biometrics - 01/03/23 1701       Pre Biometrics   Height 5' 9.75" (1.772 m)    Weight 442 lb 3.2 oz (200.6 kg)    BMI (Calculated) 63.88    Single Leg Stand 2.03 seconds              Nutrition Therapy Plan and Nutrition Goals:  Nutrition Therapy & Goals - 01/03/23 1651       Nutrition Therapy   RD appointment deferred Yes   Deferred- enrolled in a group at [facility] that includes a dietician     Intervention Plan   Intervention Prescribe, educate and counsel regarding individualized specific dietary modifications aiming towards targeted core components such as weight, hypertension, lipid management, diabetes, heart failure  and other comorbidities.    Expected Outcomes Short Term Goal: Understand basic principles of dietary content, such as calories, fat, sodium, cholesterol and nutrients.;Short Term Goal: A plan has been developed with personal nutrition goals set during dietitian appointment.;Long Term Goal: Adherence to prescribed nutrition plan.             Nutrition Assessments:  MEDIFICTS Score Key: ?70 Need to make dietary changes  40-70 Heart Healthy Diet ? 40 Therapeutic Level Cholesterol Diet  Flowsheet Row Cardiac Rehab from 01/03/2023 in [facility] Cardiac and Pulmonary Rehab  Picture Your Plate Total Score on Admission 63      Picture Your Plate Scores: <16 Unhealthy dietary pattern with much room for improvement. 41-50 Dietary pattern unlikely to meet recommendations for good health and room for improvement. 51-60 More healthful dietary pattern, with some room for improvement.  >60 Healthy dietary pattern, although there may be some specific behaviors that could be improved.    Nutrition Goals Re-Evaluation:   Nutrition Goals Discharge (Final Nutrition Goals Re-Evaluation):   Psychosocial: Target Goals: Acknowledge presence or absence of significant depression and/or stress, maximize coping skills, provide positive support system. Participant is  able to verbalize types and ability to use techniques and skills needed for reducing stress and depression.   Education: Stress, Anxiety, and Depression - Group verbal and visual presentation to define topics covered.  Reviews how body is impacted by stress, anxiety, and depression.  Also discusses healthy ways to reduce stress and to treat/manage anxiety and depression.  Written material given at graduation.   Education: Sleep Hygiene -Provides group verbal and written instruction about how sleep can affect your health.  Define sleep hygiene, discuss sleep cycles and impact of sleep habits. Review good sleep hygiene tips.    Initial Review &  Psychosocial Screening:  Initial Psych Review & Screening - 12/27/22 1427       Initial Review   Current issues with Current Psychotropic Meds;Current Depression;Current Anxiety/Panic;Current Stress Concerns    Source of Stress Concerns Family      Family Dynamics   Good Support System? Yes   lives with sister, parents live nearby too, Son     Barriers   Psychosocial barriers to participate in program The patient should benefit from training in stress management and relaxation.;Psychosocial barriers identified (see note)      Screening Interventions   Interventions Encouraged to exercise;Provide feedback about the scores to participant;To provide support and resources with identified psychosocial needs    Expected Outcomes Short Term goal: Utilizing psychosocial counselor, staff and physician to assist with identification of specific Stressors or current issues interfering with healing process. Setting desired goal for each stressor or current issue identified.;Long Term Goal: Stressors or current issues are controlled or eliminated.;Short Term goal: Identification and review with participant of any Quality of Life or Depression concerns found by scoring the questionnaire.;Long Term goal: The participant improves quality of Life and PHQ9 Scores as seen by post scores and/or verbalization of changes             Quality of Life Scores:   Quality of Life - 01/03/23 1651       Quality of Life   Select Quality of Life      Quality of Life Scores   Health/Function Pre 17.2 %    Socioeconomic Pre 23.63 %    Psych/Spiritual Pre 17.79 %    Family Pre 19.2 %    GLOBAL Pre 19.07 %            Scores of 19 and below usually indicate a poorer quality of life in these areas.  A difference of  2-3 points is a clinically meaningful difference.  A difference of 2-3 points in the total score of the Quality of Life Index has been associated with significant improvement in overall quality of  life, self-image, physical symptoms, and general health in studies assessing change in quality of life.  PHQ-9: Review Flowsheet       01/03/2023  Depression screen PHQ 2/9  Decreased Interest 0  Down, Depressed, Hopeless 1  PHQ - 2 Score 1  Altered sleeping 0  Tired, decreased energy 2  Change in appetite 0  Feeling bad or failure about yourself  0  Trouble concentrating 0  Moving slowly or fidgety/restless 0  Suicidal thoughts 0  PHQ-9 Score 3  Difficult doing work/chores Somewhat difficult   Interpretation of Total Score  Total Score Depression Severity:  1-4 = Minimal depression, 5-9 = Mild depression, 10-14 = Moderate depression, 15-19 = Moderately severe depression, 20-27 = Severe depression   Psychosocial Evaluation and Intervention:  Psychosocial Evaluation - 01/03/23 1658  Psychosocial Evaluation & Interventions   Comments Patient states she feels well mentally at this time compared to how she used to feel about 10 years ago when she was in a "darker" place. 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 She is excited to start the program and denies any other SI concerns at this time. Encouraged patient to notify if anything changes or starts to feel worse.             Psychosocial Re-Evaluation:   Psychosocial Discharge (Final Psychosocial Re-Evaluation):   Vocational Rehabilitation: Provide vocational rehab assistance to qualifying candidates.   Vocational Rehab Evaluation & Intervention:  Vocational Rehab - 12/27/22 1426       Initial Vocational Rehab Evaluation & Intervention   Assessment shows need for Vocational Rehabilitation No   medical coder, just returned to work            Education: Education Goals: Education classes will be provided on a variety of topics geared toward better understanding of heart health and risk factor  modification. Participant will state understanding/return demonstration of topics presented as noted by education test scores.  Learning Barriers/Preferences:  Learning Barriers/Preferences - 12/27/22 1425       Learning Barriers/Preferences   Learning Barriers Sight   glasses   Learning Preferences Group Instruction;Individual Instruction;Skilled Demonstration;Verbal Instruction;Written Material             General Cardiac Education Topics:  AED/CPR: - Group verbal and written instruction with the use of models to demonstrate the basic use of the AED with the basic ABC's of resuscitation.   Anatomy and Cardiac Procedures: - Group verbal and visual presentation and models provide information about basic cardiac anatomy and function. Reviews the testing methods done to diagnose heart disease and the outcomes of the test results. Describes the treatment choices: Medical Management, Angioplasty, or Coronary Bypass Surgery for treating various heart conditions including Myocardial Infarction, Angina, Valve Disease, and Cardiac Arrhythmias.  Written material given at graduation. Flowsheet Row Cardiac Rehab from 01/03/2023 in [facility] Cardiac and Pulmonary Rehab  Education need identified 01/03/23       Medication Safety: - Group verbal and visual instruction to review commonly prescribed medications for heart and lung disease. Reviews the medication, class of the drug, and side effects. Includes the steps to properly store meds and maintain the prescription regimen.  Written material given at graduation.   Intimacy: - Group verbal instruction through game format to discuss how heart and lung disease can affect sexual intimacy. Written material given at graduation.. Flowsheet Row Cardiac Rehab from 01/03/2023 in [facility] Cardiac and Pulmonary Rehab  Education need identified 01/03/23       Know Your Numbers and Heart Failure: - Group verbal and visual instruction to discuss disease risk  factors for cardiac and pulmonary disease and treatment options.  Reviews associated critical values for Overweight/Obesity, Hypertension, Cholesterol, and Diabetes.  Discusses basics of heart failure: signs/symptoms and treatments.  Introduces Heart Failure Zone chart for action plan for heart failure.  Written material given at graduation.   Infection Prevention: - Provides verbal and written material to individual with discussion of infection control including proper hand washing and proper equipment cleaning during exercise session. Flowsheet Row Cardiac Rehab from 12/27/2022 in [facility] Cardiac and Pulmonary Rehab  Date 12/27/22  Educator [facility]  Instruction Review Code 1- Verbalizes Understanding  Falls Prevention: - Provides verbal and written material to individual with discussion of falls prevention and safety. Flowsheet Row Cardiac Rehab from 12/27/2022 in [facility] Cardiac and Pulmonary Rehab  Date 12/27/22  Educator [facility]  Instruction Review Code 1- Verbalizes Understanding       Other: -Provides group and verbal instruction on various topics (see comments)   Knowledge Questionnaire Score:  Knowledge Questionnaire Score - 01/03/23 1651       Knowledge Questionnaire Score   Pre Score 23/26             Core Components/Risk Factors/Patient Goals at Admission:  Personal Goals and Risk Factors at Admission - 01/03/23 1714       Core Components/Risk Factors/Patient Goals on Admission    Weight Management Yes;Obesity;Weight Loss    Intervention Weight Management: Develop a combined nutrition and exercise program designed to reach desired caloric intake, while maintaining appropriate intake of nutrient and fiber, sodium and fats, and appropriate energy expenditure required for the weight goal.;Weight Management: Provide education and appropriate resources to help participant work on and attain dietary goals.;Weight Management/Obesity: Establish reasonable short term and long term  weight goals.;Obesity: Provide education and appropriate resources to help participant work on and attain dietary goals.    Admit Weight 442 lb (200.5 kg)    Goal Weight: Short Term 436 lb (197.8 kg)    Goal Weight: Long Term 200 lb (90.7 kg)   per patient goal   Expected Outcomes Short Term: Continue to assess and modify interventions until short term weight is achieved;Long Term: Adherence to nutrition and physical activity/exercise program aimed toward attainment of established weight goal;Weight Loss: Understanding of general recommendations for a balanced deficit meal plan, which promotes 1-2 lb weight loss per week and includes a negative energy balance of [phone] kcal/d;Understanding recommendations for meals to include 15-35% energy as protein, 25-35% energy from fat, 35-60% energy from carbohydrates, less than 200mg  of dietary cholesterol, 20-35 gm of total fiber daily;Understanding of distribution of calorie intake throughout the day with the consumption of 4-5 meals/snacks    Hypertension Yes    Intervention Provide education on lifestyle modifcations including regular physical activity/exercise, weight management, moderate sodium restriction and increased consumption of fresh fruit, vegetables, and low fat dairy, alcohol moderation, and smoking cessation.;Monitor prescription use compliance.    Expected Outcomes Short Term: Continued assessment and intervention until BP is < 140/42mm HG in hypertensive participants. < 130/60mm HG in hypertensive participants with diabetes, heart failure or chronic kidney disease.;Long Term: Maintenance of blood pressure at goal levels.    Lipids Yes    Intervention Provide education and support for participant on nutrition & aerobic/resistive exercise along with prescribed medications to achieve LDL 70mg , HDL >40mg .    Expected Outcomes Short Term: Participant states understanding of desired cholesterol values and is compliant with medications prescribed.  Participant is following exercise prescription and nutrition guidelines.;Long Term: Cholesterol controlled with medications as prescribed, with individualized exercise RX and with personalized nutrition plan. Value goals: LDL < 70mg , HDL > 40 mg.             Education:Diabetes - Individual verbal and written instruction to review signs/symptoms of diabetes, desired ranges of glucose level fasting, after meals and with exercise. Acknowledge that pre and post exercise glucose checks will be done for 3 sessions at entry of program.   Core Components/Risk Factors/Patient Goals Review:    Core Components/Risk Factors/Patient Goals at Discharge (Final Review):    ITP Comments:  ITP Comments  Row Name 12/27/22 1441 01/03/23 1649 01/04/23 1714 01/10/23 0800     ITP Comments Completed virtual orientation today.  EP evaluation is scheduled for Wed 5/8 at 230.  Documentation for diagnosis can be found in [facility] encounter 12/12/22. Completed and gym orientation. Initial ITP created and sent for review to Dr. Bethann Punches, Medical Director. Provided handouts on stress management, breathing techniques and progressive muscular relaxation. Reviewed education on stress management as that is patient's biggest concern at this time- talked about importance on control. First full day of exercise!  Patient was oriented to gym and equipment including functions, settings, policies, and procedures.  Patient's individual exercise prescription and treatment plan were reviewed.  All starting workloads were established based on the results of the 6 minute walk test done at initial orientation visit.  The plan for exercise progression was also introduced and progression will be customized based on patient's performance and goals. 30 Day review completed. Medical Director ITP review done, changes made as directed, and signed approval by Medical Director.   new to program             Comments:

## 2023-01-10 NOTE — Progress Notes (Signed)
Daily Session Note  Patient Details  Name: Claudia Campbell MRN: [id number] Date of Birth: August 10, 1971 Referring Provider:   Flowsheet Row Cardiac Rehab from 01/03/2023 in [facility] Cardiac and Pulmonary Rehab  Referring Provider End, Cristal Deer MD       Encounter Date: 01/10/2023  Check In:  Session Check In - 01/10/23 1730       Check-In   Supervising physician immediately available to respond to emergencies See telemetry face sheet for immediately available ER MD    Location [facility]-Cardiac & Pulmonary Rehab    Staff Present Susann Givens, RN BSN;Laureen Manson Passey, BS, RRT, CPFT;Kara Clinton Sawyer, MS, ACSM CEP, Exercise Physiologist    Virtual Visit No    Medication changes reported     Yes    Comments increased losartan    Fall or balance concerns reported    No    Warm-up and Cool-down Performed on first and last piece of equipment    Resistance Training Performed Yes    VAD Patient? No    PAD/SET Patient? No      Pain Assessment   Currently in Pain? No/denies                Social History   Tobacco Use  Smoking Status Never  Smokeless Tobacco Never    Goals Met:  Independence with exercise equipment Exercise tolerated well No report of concerns or symptoms today Strength training completed today  Goals Unmet:  Not Applicable  Comments: Pt able to follow exercise prescription today without complaint.  Will continue to monitor for progression.    Dr. Bethann Punches is Medical Director for [facility] Cardiac Rehabilitation.  Dr. Vida Rigger is Medical Director for [facility] Pulmonary Rehabilitation.

## 2023-01-15 ENCOUNTER — Encounter: Payer: BC Managed Care – PPO | Admitting: *Deleted

## 2023-01-15 DIAGNOSIS — I2542 Coronary artery dissection: Secondary | ICD-10-CM

## 2023-01-15 DIAGNOSIS — E119 Type 2 diabetes mellitus without complications: Secondary | ICD-10-CM | POA: Diagnosis not present

## 2023-01-15 DIAGNOSIS — I21A1 Myocardial infarction type 2: Secondary | ICD-10-CM

## 2023-01-15 NOTE — Progress Notes (Signed)
Daily Session Note  Patient Details  Name: Claudia Campbell MRN: [id number] Date of Birth: 1971/01/14 Referring Provider:   Flowsheet Row Cardiac Rehab from 01/03/2023 in [facility] Cardiac and Pulmonary Rehab  Referring Provider End, Cristal Deer MD       Encounter Date: 01/15/2023  Check In:  Session Check In - 01/15/23 1717       Check-In   Supervising physician immediately available to respond to emergencies See telemetry face sheet for immediately available ER MD    Location [facility]-Cardiac & Pulmonary Rehab    Staff Present Susann Givens, RN BSN;Joseph Reino Kent, RCP,RRT,BSRT;Noah [place], [place], Exercise Physiologist    Virtual Visit No    Medication changes reported     No    Fall or balance concerns reported    No    Warm-up and Cool-down Performed on first and last piece of equipment    Resistance Training Performed Yes    VAD Patient? No    PAD/SET Patient? No      Pain Assessment   Currently in Pain? No/denies                Social History   Tobacco Use  Smoking Status Never  Smokeless Tobacco Never    Goals Met:  Independence with exercise equipment Exercise tolerated well No report of concerns or symptoms today Strength training completed today  Goals Unmet:  Not Applicable  Comments: Pt able to follow exercise prescription today without complaint.  Will continue to monitor for progression.    Dr. Bethann Punches is Medical Director for [facility] Cardiac Rehabilitation.  Dr. Vida Rigger is Medical Director for [facility] Pulmonary Rehabilitation.

## 2023-01-18 ENCOUNTER — Encounter: Payer: BC Managed Care – PPO | Admitting: *Deleted

## 2023-01-18 DIAGNOSIS — I21A1 Myocardial infarction type 2: Secondary | ICD-10-CM

## 2023-01-18 DIAGNOSIS — E119 Type 2 diabetes mellitus without complications: Secondary | ICD-10-CM | POA: Diagnosis not present

## 2023-01-18 NOTE — Progress Notes (Signed)
Average heart rate of 73 beats per minute, rare early beats, no arrhythmias or pauses noted

## 2023-01-18 NOTE — Progress Notes (Signed)
Daily Session Note  Patient Details  Name: Claudia Campbell MRN: [id number] Date of Birth: Oct 15, 1970 Referring Provider:   Flowsheet Row Cardiac Rehab from 01/03/2023 in [facility] Cardiac and Pulmonary Rehab  Referring Provider End, Cristal Deer MD       Encounter Date: 01/18/2023  Check In:  Session Check In - 01/18/23 1721       Check-In   Supervising physician immediately available to respond to emergencies See telemetry face sheet for immediately available ER MD    Location [facility]-Cardiac & Pulmonary Rehab    Staff Present Susann Givens, RN BSN;Joseph Reino Kent, RCP,RRT,BSRT;Noah [place], [place], Exercise Physiologist    Virtual Visit No    Medication changes reported     No    Fall or balance concerns reported    No    Warm-up and Cool-down Performed on first and last piece of equipment    Resistance Training Performed Yes    VAD Patient? No    PAD/SET Patient? No      Pain Assessment   Currently in Pain? No/denies                Social History   Tobacco Use  Smoking Status Never  Smokeless Tobacco Never    Goals Met:  Independence with exercise equipment Improved SOB with ADL's Exercise tolerated well No report of concerns or symptoms today Strength training completed today  Goals Unmet:  Not Applicable  Comments: Pt able to follow exercise prescription today without complaint.  Will continue to monitor for progression.    Dr. Bethann Punches is Medical Director for [facility] Cardiac Rehabilitation.  Dr. Vida Rigger is Medical Director for [facility] Pulmonary Rehabilitation.

## 2023-01-24 ENCOUNTER — Encounter: Payer: BC Managed Care – PPO | Admitting: *Deleted

## 2023-01-24 DIAGNOSIS — I21A1 Myocardial infarction type 2: Secondary | ICD-10-CM

## 2023-01-24 DIAGNOSIS — E119 Type 2 diabetes mellitus without complications: Secondary | ICD-10-CM | POA: Diagnosis not present

## 2023-01-24 NOTE — Progress Notes (Signed)
Daily Session Note  Patient Details  Name: Claudia Campbell MRN: [id number] Date of Birth: 01-Sep-1970 Referring Provider:   Flowsheet Row Cardiac Rehab from 01/03/2023 in [facility] Cardiac and Pulmonary Rehab  Referring Provider End, Cristal Deer MD       Encounter Date: 01/24/2023  Check In:  Session Check In - 01/24/23 1700       Check-In   Supervising physician immediately available to respond to emergencies See telemetry face sheet for immediately available ER MD    Location [facility]-Cardiac & Pulmonary Rehab    Staff Present Susann Givens, RN BSN;Joseph [place], RCP,RRT,BSRT;Kelly [place], [place], ACSM CEP, Exercise Physiologist    Virtual Visit No    Medication changes reported     No    Fall or balance concerns reported    No    Warm-up and Cool-down Performed on first and last piece of equipment    Resistance Training Performed Yes    VAD Patient? No    PAD/SET Patient? No      Pain Assessment   Currently in Pain? No/denies                Social History   Tobacco Use  Smoking Status Never  Smokeless Tobacco Never    Goals Met:  Independence with exercise equipment Exercise tolerated well No report of concerns or symptoms today Strength training completed today  Goals Unmet:  Not Applicable  Comments: Pt able to follow exercise prescription today without complaint.  Will continue to monitor for progression.    Dr. Bethann Punches is Medical Director for [facility] Cardiac Rehabilitation.  Dr. Vida Rigger is Medical Director for [facility] Pulmonary Rehabilitation.

## 2023-01-29 ENCOUNTER — Encounter: Payer: BC Managed Care – PPO | Attending: Internal Medicine | Admitting: *Deleted

## 2023-01-29 DIAGNOSIS — Z5189 Encounter for other specified aftercare: Secondary | ICD-10-CM | POA: Insufficient documentation

## 2023-01-29 DIAGNOSIS — I252 Old myocardial infarction: Secondary | ICD-10-CM | POA: Insufficient documentation

## 2023-01-29 DIAGNOSIS — I495 Sick sinus syndrome: Secondary | ICD-10-CM | POA: Diagnosis not present

## 2023-01-29 DIAGNOSIS — I21A1 Myocardial infarction type 2: Secondary | ICD-10-CM | POA: Insufficient documentation

## 2023-01-29 DIAGNOSIS — I2542 Coronary artery dissection: Secondary | ICD-10-CM | POA: Insufficient documentation

## 2023-01-29 NOTE — Progress Notes (Signed)
Daily Session Note  Patient Details  Name: Claudia Campbell MRN: [id number] Date of Birth: 02/10/1971 Referring Provider:   Flowsheet Row Cardiac Rehab from 01/03/2023 in [facility] Cardiac and Pulmonary Rehab  Referring Provider End, Cristal Deer MD       Encounter Date: 01/29/2023  Check In:      Social History   Tobacco Use  Smoking Status Never  Smokeless Tobacco Never    Goals Met:  Independence with exercise equipment Exercise tolerated well No report of concerns or symptoms today  Goals Unmet:  Not Applicable  Comments: Pt able to follow exercise prescription today without complaint.  Will continue to monitor for progression.    Dr. Bethann Punches is Medical Director for [facility] Cardiac Rehabilitation.  Dr. Vida Rigger is Medical Director for [facility] Pulmonary Rehabilitation.

## 2023-01-30 ENCOUNTER — Other Ambulatory Visit
Admission: RE | Admit: 2023-01-30 | Discharge: 2023-01-30 | Disposition: A | Discharge status: Home / Self Care | Payer: BC Managed Care – PPO | Source: Ambulatory Visit | Attending: Internal Medicine | Admitting: Internal Medicine

## 2023-01-30 DIAGNOSIS — Z79899 Other long term (current) drug therapy: Secondary | ICD-10-CM

## 2023-01-30 LAB — BASIC METABOLIC PANEL
Anion gap: 9 (ref 5–15)
BUN: 15 mg/dL (ref 6–20)
CO2: 26 mmol/L (ref 22–32)
Calcium: 9.1 mg/dL (ref 8.9–10.3)
Chloride: 102 mmol/L (ref 98–111)
Creatinine, Ser: 0.72 mg/dL (ref 0.44–1.00)
GFR, Estimated: >60 mL/min (ref >60)
Glucose, Bld: 107 mg/dL — ABNORMAL HIGH (ref 70–99)
Potassium: 4.1 mmol/L (ref 3.5–5.1)
Sodium: 137 mmol/L (ref 135–145)

## 2023-01-31 ENCOUNTER — Encounter: Payer: BC Managed Care – PPO | Admitting: *Deleted

## 2023-01-31 DIAGNOSIS — I21A1 Myocardial infarction type 2: Secondary | ICD-10-CM

## 2023-01-31 DIAGNOSIS — I2542 Coronary artery dissection: Secondary | ICD-10-CM

## 2023-01-31 NOTE — Progress Notes (Signed)
Daily Session Note  Patient Details  Name: Claudia Campbell MRN: [id number] Date of Birth: 04-Dec-1970 Referring Provider:   Flowsheet Row Cardiac Rehab from 01/03/2023 in [facility] Cardiac and Pulmonary Rehab  Referring Provider End, Cristal Deer MD       Encounter Date: 01/31/2023  Check In:  Session Check In - 01/31/23 1731       Check-In   Supervising physician immediately available to respond to emergencies See telemetry face sheet for immediately available ER MD    Location [facility]-Cardiac & Pulmonary Rehab    Staff Present Cora Collum, RN, BSN, CCRP;Kierah Goatley Karleen Hampshire RN, BSN;Joseph Swan, [place]    Virtual Visit No    Medication changes reported     No    Fall or balance concerns reported    No    Warm-up and Cool-down Performed on first and last piece of equipment    Resistance Training Performed Yes    VAD Patient? No    PAD/SET Patient? No      Pain Assessment   Currently in Pain? No/denies                Social History   Tobacco Use  Smoking Status Never  Smokeless Tobacco Never    Goals Met:  Independence with exercise equipment Exercise tolerated well No report of concerns or symptoms today Strength training completed today  Goals Unmet:  Not Applicable  Comments: Pt able to follow exercise prescription today without complaint.  Will continue to monitor for progression.  Reviewed home exercise with pt today.  Pt plans to rejoin [employer] and use stationary bike at home for exercise.  Reviewed THR, pulse, RPE, sign and symptoms, pulse oximetery and when to call 911 or MD.  Also discussed weather considerations and indoor options.  Pt voiced understanding.     Dr. Bethann Punches is Medical Director for [facility] Cardiac Rehabilitation.  Dr. Vida Rigger is Medical Director for [facility] Pulmonary Rehabilitation.

## 2023-02-05 ENCOUNTER — Telehealth: Payer: Self-pay | Admitting: Internal Medicine

## 2023-02-05 ENCOUNTER — Encounter: Payer: BC Managed Care – PPO | Admitting: *Deleted

## 2023-02-05 DIAGNOSIS — I2542 Coronary artery dissection: Secondary | ICD-10-CM

## 2023-02-05 DIAGNOSIS — I21A1 Myocardial infarction type 2: Secondary | ICD-10-CM

## 2023-02-05 NOTE — Telephone Encounter (Signed)
Spoke to patient and she stated while sitting at work she was experiencing some pain in her upper back right side. She stated she wasn't sure if it was related to her heart or not but wanted to call and check. Patient states she takes her medications as prescribed. Patient denies chest pain, shortness of breath, nausea, vomiting, excessive sweating, and fever. Patient denies consuming foods that have caused excessive gas. Instructed patient to call back in signs and symptoms worsen or call 911 if chest pain and shortness of breath at rest occur. Patient is scheduled Friday 02/09/23 @ 1:30 PM. Patient understood with read back.

## 2023-02-05 NOTE — Progress Notes (Signed)
Daily Session Note  Patient Details  Name: Claudia Campbell MRN: [id number] Date of Birth: 11-Nov-1970 Referring Provider:   Flowsheet Row Cardiac Rehab from 01/03/2023 in [facility] Cardiac and Pulmonary Rehab  Referring Provider End, Cristal Deer MD       Encounter Date: 02/05/2023  Check In:  Session Check In - 02/05/23 1713       Check-In   Supervising physician immediately available to respond to emergencies See telemetry face sheet for immediately available ER MD    Location [facility]-Cardiac & Pulmonary Rehab    Staff Present Susann Givens, RN BSN;Joseph [place], RCP,RRT,BSRT;Laureen [place], [place], RRT, CPFT    Virtual Visit No    Medication changes reported     No    Fall or balance concerns reported    No    Warm-up and Cool-down Performed on first and last piece of equipment    Resistance Training Performed Yes    VAD Patient? No    PAD/SET Patient? No      Pain Assessment   Currently in Pain? No/denies                Social History   Tobacco Use  Smoking Status Never  Smokeless Tobacco Never    Goals Met:  Independence with exercise equipment Exercise tolerated well No report of concerns or symptoms today Strength training completed today  Goals Unmet:  Not Applicable  Comments: Pt able to follow exercise prescription today without complaint.  Will continue to monitor for progression.    Dr. Bethann Punches is Medical Director for [facility] Cardiac Rehabilitation.  Dr. Vida Rigger is Medical Director for [facility] Pulmonary Rehabilitation.

## 2023-02-05 NOTE — Telephone Encounter (Signed)
Pt c/o of Chest Pain: STAT if CP now or developed within 24 hours  1. Are you having CP right now? Pain in the right side of her back  2. Are you experiencing any other symptoms (ex. SOB, nausea, vomiting, sweating)? no  3. How long have you been experiencing CP? 25 minute today  4. Is your CP continuous or coming and going? Coming and going  5. Have you taken Nitroglycerin? no ?

## 2023-02-07 ENCOUNTER — Encounter: Payer: Self-pay | Admitting: *Deleted

## 2023-02-07 NOTE — Progress Notes (Signed)
Cardiac Individual Treatment Plan  Patient Details  Name: Claudia Campbell MRN: [id number] Date of Birth: 1971-02-02 Referring Provider:   Flowsheet Row Cardiac Rehab from 01/03/2023 in [facility] Cardiac and Pulmonary Rehab  Referring Provider End, Cristal Deer MD       Initial Encounter Date:  Flowsheet Row Cardiac Rehab from 01/03/2023 in [facility] Cardiac and Pulmonary Rehab  Date 01/03/23       Visit Diagnosis: No diagnosis found.  Patient's Home Medications on Admission:  Current Outpatient Medications:    albuterol (VENTOLIN HFA) 108 (90 Base) MCG/ACT inhaler, Inhale 2 puffs into the lungs every 6 (six) hours as needed for wheezing or shortness of breath., Disp: , Rfl:    aspirin EC 81 MG tablet, Take 1 tablet (81 mg total) by mouth daily. Swallow whole., Disp: 30 tablet, Rfl: 12   atorvastatin (LIPITOR) 40 MG tablet, Take 1 tablet (40 mg total) by mouth daily., Disp: 90 tablet, Rfl: 1   augmented betamethasone dipropionate (DIPROLENE-AF) 0.05 % cream, APPLY THIN FILM TO HANDS AND FEET SITES TWICE DAILY UNTIL CLEAR, Disp: , Rfl:    betamethasone dipropionate 0.05 % cream, Apply 1 Application topically daily., Disp: , Rfl:    Cholecalciferol 250 MCG (10000 UT) CAPS, Take 1,000 Units by mouth daily., Disp: , Rfl:    Ciclopirox 1 % shampoo, Apply 1 Application topically 3 (three) times a week., Disp: , Rfl:    esomeprazole (NEXIUM) 40 MG capsule, Take 40 mg by mouth 2 (two) times daily before a meal. , Disp: , Rfl:    fluocinonide (LIDEX) 0.05 % external solution, Apply 1 Application topically 2 (two) times daily., Disp: , Rfl:    fluticasone (FLONASE) 50 MCG/ACT nasal spray, Place 1 spray into both nostrils daily., Disp: , Rfl:    levothyroxine (SYNTHROID) 88 MCG tablet, Take 88 mcg by mouth daily before breakfast., Disp: , Rfl:    losartan (COZAAR) 25 MG tablet, Take 1 tablet (25 mg total) by mouth daily., Disp: 90 tablet, Rfl: 3   meclizine (ANTIVERT) 25 MG tablet, Take 1 tablet (25 mg total)  by mouth 3 (three) times daily as needed for dizziness., Disp: 20 tablet, Rfl: 0   metoprolol succinate (TOPROL-XL) 25 MG 24 hr tablet, Take 1 tablet (25 mg total) by mouth every evening., Disp: 30 tablet, Rfl: 1   montelukast (SINGULAIR) 10 MG tablet, Take 1 tablet by mouth at bedtime., Disp: , Rfl:    nitroGLYCERIN (NITROSTAT) 0.4 MG SL tablet, Place 1 tablet (0.4 mg total) under the tongue every 5 (five) minutes as needed for chest pain., Disp: 30 tablet, Rfl: 12   sertraline (ZOLOFT) 100 MG tablet, Take 150 mg by mouth daily. , Disp: , Rfl:    tacrolimus (PROTOPIC) 0.1 % ointment, Apply 1 Application topically 2 (two) times daily., Disp: , Rfl:   Past Medical History: Past Medical History:  Diagnosis Date   Abnormal liver function test    Anxiety    Arthritis    Asthma    Barrett esophagus    Depression    First degree AV block    GERD (gastroesophageal reflux disease)    Hypothyroidism    Post ablation   Kidney stones    Migraines    PONV (postoperative nausea and vomiting)    Restless leg    Sleep apnea    NO cpap at this time, broken   Tenosynovitis    Thyroid nodule    Vitamin D deficiency     Tobacco Use: Social History  Tobacco Use  Smoking Status Never  Smokeless Tobacco Never    Labs: Review Flowsheet       Latest Ref Rng & Units 12/14/2022  Labs for ITP Cardiac and Pulmonary Rehab  Cholestrol 0 - 200 mg/dL 161   LDL (calc) 0 - 99 mg/dL 76   HDL-C >09 mg/dL 43   Trlycerides <604 mg/dL 540      Exercise Target Goals: Exercise Program Goal: Individual exercise prescription set using results from initial 6 min walk test and THRR while considering  patient's activity barriers and safety.   Exercise Prescription Goal: Initial exercise prescription builds to 30-45 minutes a day of aerobic activity, 2-3 days per week.  Home exercise guidelines will be given to patient during program as part of exercise prescription that the participant will  acknowledge.   Education: Aerobic Exercise: - Group verbal and visual presentation on the components of exercise prescription. Introduces F.I.T.T principle from ACSM for exercise prescriptions.  Reviews F.I.T.T. principles of aerobic exercise including progression. Written material given at graduation. Flowsheet Row Cardiac Rehab from 01/31/2023 in [facility] Cardiac and Pulmonary Rehab  Education need identified 01/03/23  Date 01/31/23  Educator [facility]  Instruction Review Code 1- [employer] Understanding       Education: Resistance Exercise: - Group verbal and visual presentation on the components of exercise prescription. Introduces F.I.T.T principle from ACSM for exercise prescriptions  Reviews F.I.T.T. principles of resistance exercise including progression. Written material given at graduation.    Education: Exercise & Equipment Safety: - Individual verbal instruction and demonstration of equipment use and safety with use of the equipment. Flowsheet Row Cardiac Rehab from 12/27/2022 in [facility] Cardiac and Pulmonary Rehab  Date 12/27/22  Educator [facility]  Instruction Review Code 1- Verbalizes Understanding       Education: Exercise Physiology & General Exercise Guidelines: - Group verbal and written instruction with models to review the exercise physiology of the cardiovascular system and associated critical values. Provides general exercise guidelines with specific guidelines to those with heart or lung disease.    Education: Flexibility, Balance, Mind/Body Relaxation: - Group verbal and visual presentation with interactive activity on the components of exercise prescription. Introduces F.I.T.T principle from ACSM for exercise prescriptions. Reviews F.I.T.T. principles of flexibility and balance exercise training including progression. Also discusses the mind body connection.  Reviews various relaxation techniques to help reduce and manage stress (i.e. Deep breathing, progressive muscle relaxation,  and visualization). Balance handout provided to take home. Written material given at graduation.   Activity Barriers & Risk Stratification:  Activity Barriers & Cardiac Risk Stratification - 01/03/23 1700       Activity Barriers & Cardiac Risk Stratification   Activity Barriers Deconditioning;Muscular Weakness;Shortness of Breath;Joint Problems;Other (comment);Arthritis;Balance Concerns    Comments no cartilidge in bilateral knees, tendonitis both wrist and elbows, hip pain, hx left rotator cuff repair    Cardiac Risk Stratification Moderate             6 Minute Walk:  6 Minute Walk     Row Name 01/03/23 1703         6 Minute Walk   Phase Initial     Distance 585 feet     Walk Time 4.06 minutes     # of Rest Breaks 1  2:54-4:48     MPH 1.63     METS 1.03     RPE 11     Perceived Dyspnea  1     VO2 Peak 3.62  Symptoms Yes (comment)     Comments left sided hip pain 2/10, SOB     Resting HR 81 bpm     Resting BP 124/78     Resting Oxygen Saturation  97 %     Exercise Oxygen Saturation  during 6 min walk 96 %     Max Ex. HR 121 bpm     Max Ex. BP 162/84     2 Minute Post BP 130/80              Oxygen Initial Assessment:   Oxygen Re-Evaluation:   Oxygen Discharge (Final Oxygen Re-Evaluation):   Initial Exercise Prescription:  Initial Exercise Prescription - 01/03/23 1700       Date of Initial Exercise RX and Referring Provider   Date 01/03/23    Referring Provider End, Cristal Deer MD      Oxygen   Maintain Oxygen Saturation 88% or higher      T5 Nustep   Level 1    SPM 80    Minutes 15    METs 1      Biostep-RELP   Level 1    SPM 50    Minutes 15    METs 1      Track   Laps 9    Minutes 15    METs 1.49      Prescription Details   Frequency (times per week) 3    Duration Progress to 30 minutes of continuous aerobic without signs/symptoms of physical distress      Intensity   THRR 40-80% of Max Heartrate 117 - 153    Ratings of  Perceived Exertion 11-13    Perceived Dyspnea 0-4      Progression   Progression Continue to progress workloads to maintain intensity without signs/symptoms of physical distress.      Resistance Training   Training Prescription Yes    Weight 3 lb    Reps 10-15             Perform Capillary Blood Glucose checks as needed.  Exercise Prescription Changes:   Exercise Prescription Changes     Row Name 01/03/23 1700 01/09/23 1400 01/23/23 0800 01/31/23 1700 02/05/23 1600     Response to Exercise   Blood Pressure (Admit) 124/78 126/76 132/84 -- 112/78   Blood Pressure (Exercise) 162/84 160/84 184/100 -- 158/90   Blood Pressure (Exit) 130/80 124/86 126/78 -- 136/80   Heart Rate (Admit) 81 bpm 92 bpm 93 bpm -- 98 bpm   Heart Rate (Exercise) 121 bpm 114 bpm 119 bpm -- 105 bpm   Heart Rate (Exit) 85 bpm 91 bpm 103 bpm -- 82 bpm   Oxygen Saturation (Admit) 97 % -- -- -- --   Oxygen Saturation (Exercise) 96 % -- -- -- --   Oxygen Saturation (Exit) 97 % -- -- -- --   Rating of Perceived Exertion (Exercise) 11 13 14  -- 12   Perceived Dyspnea (Exercise) 1 -- -- -- --   Symptoms left sided hip pain 2/10, SOB hip pain -- -- none   Comments walk test results 2nd full day of exercise -- -- --   Duration -- Progress to 30 minutes of  aerobic without signs/symptoms of physical distress Progress to 30 minutes of  aerobic without signs/symptoms of physical distress Progress to 30 minutes of  aerobic without signs/symptoms of physical distress Continue with 30 min of aerobic exercise without signs/symptoms of physical distress.   Intensity -- THRR unchanged THRR unchanged  THRR unchanged THRR unchanged     Progression   Progression -- Continue to progress workloads to maintain intensity without signs/symptoms of physical distress. Continue to progress workloads to maintain intensity without signs/symptoms of physical distress. Continue to progress workloads to maintain intensity without  signs/symptoms of physical distress. Continue to progress workloads to maintain intensity without signs/symptoms of physical distress.   Average METs -- 1.9 1.9 1.9 2     Resistance Training   Training Prescription -- Yes Yes Yes Yes   Weight -- 3 lb 3 lb 3 lb 3 lb   Reps -- 10-15 10-15 10-15 10-15     Interval Training   Interval Training -- No No No No     T5 Nustep   Level -- 2 2 2 2    Minutes -- 15 15 15 30    METs -- 1.8 2 2 2      Biostep-RELP   Level -- 1 -- -- --   Minutes -- 15 -- -- --   METs -- 2 -- -- --     Track   Laps -- -- 4 4 --   Minutes -- -- 15 15 --   METs -- -- 1.22 1.22 --     Home Exercise Plan   Plans to continue exercise at -- -- -- [employer] (comment)  re-join [employer], and seated bike at home [employer] (comment)  re-join [employer], and seated bike at home   Frequency -- -- -- Add 2 additional days to program exercise sessions. Add 2 additional days to program exercise sessions.   Initial Home Exercises Provided -- -- -- 01/31/23 01/31/23     Oxygen   Maintain Oxygen Saturation -- 88% or higher 88% or higher 88% or higher 88% or higher            Exercise Comments:   Exercise Comments     Row Name 01/04/23 1714           Exercise Comments First full day of exercise!  Patient was oriented to gym and equipment including functions, settings, policies, and procedures.  Patient's individual exercise prescription and treatment plan were reviewed.  All starting workloads were established based on the results of the 6 minute walk test done at initial orientation visit.  The plan for exercise progression was also introduced and progression will be customized based on patient's performance and goals.                Exercise Goals and Review:   Exercise Goals     Row Name 01/03/23 1713             Exercise Goals   Increase Physical Activity Yes       Intervention Provide advice, education, support and  counseling about physical activity/exercise needs.;Develop an individualized exercise prescription for aerobic and resistive training based on initial evaluation findings, risk stratification, comorbidities and participant's personal goals.       Expected Outcomes Short Term: Attend rehab on a regular basis to increase amount of physical activity.;Long Term: Add in home exercise to make exercise part of routine and to increase amount of physical activity.;Long Term: Exercising regularly at least 3-5 days a week.       Increase Strength and Stamina Yes       Intervention Provide advice, education, support and counseling about physical activity/exercise needs.;Develop an individualized exercise prescription for aerobic and resistive training based on initial evaluation findings, risk stratification, comorbidities and participant's personal  goals.       Expected Outcomes Short Term: Increase workloads from initial exercise prescription for resistance, speed, and METs.;Short Term: Perform resistance training exercises routinely during rehab and add in resistance training at home;Long Term: Improve cardiorespiratory fitness, muscular endurance and strength as measured by increased METs and functional capacity ( )       Able to understand and use rate of perceived exertion (RPE) scale Yes       Intervention Provide education and explanation on how to use RPE scale       Expected Outcomes Short Term: Able to use RPE daily in rehab to express subjective intensity level;Long Term:  Able to use RPE to guide intensity level when exercising independently       Able to understand and use Dyspnea scale Yes       Intervention Provide education and explanation on how to use Dyspnea scale       Expected Outcomes Short Term: Able to use Dyspnea scale daily in rehab to express subjective sense of shortness of breath during exertion;Long Term: Able to use Dyspnea scale to guide intensity level when exercising independently        Knowledge and understanding of Target Heart Rate Range (THRR) Yes       Intervention Provide education and explanation of THRR including how the numbers were predicted and where they are located for reference       Expected Outcomes Short Term: Able to state/look up THRR;Long Term: Able to use THRR to govern intensity when exercising independently;Short Term: Able to use daily as guideline for intensity in rehab       Able to check pulse independently Yes       Intervention Provide education and demonstration on how to check pulse in carotid and radial arteries.;Review the importance of being able to check your own pulse for safety during independent exercise       Expected Outcomes Long Term: Able to check pulse independently and accurately;Short Term: Able to explain why pulse checking is important during independent exercise       Understanding of Exercise Prescription Yes       Intervention Provide education, explanation, and written materials on patient's individual exercise prescription       Expected Outcomes Short Term: Able to explain program exercise prescription;Long Term: Able to explain home exercise prescription to exercise independently                Exercise Goals Re-Evaluation :  Exercise Goals Re-Evaluation     Row Name 01/04/23 1715 01/09/23 1502 01/23/23 0854 01/31/23 1756 02/05/23 1605     Exercise Goal Re-Evaluation   Exercise Goals Review Increase Physical Activity;Able to understand and use rate of perceived exertion (RPE) scale;Knowledge and understanding of Target Heart Rate Range (THRR);Understanding of Exercise Prescription;Increase Strength and Stamina;Able to check pulse independently Increase Physical Activity;Increase Strength and Stamina;Understanding of Exercise Prescription Increase Physical Activity;Increase Strength and Stamina;Understanding of Exercise Prescription Increase Physical Activity;Increase Strength and Stamina;Understanding of Exercise  Prescription;Able to understand and use rate of perceived exertion (RPE) scale;Knowledge and understanding of Target Heart Rate Range (THRR);Able to check pulse independently Increase Physical Activity;Increase Strength and Stamina;Understanding of Exercise Prescription   Comments Reviewed RPE scale, THR and program prescription with pt today.  Pt voiced understanding and was given a copy of goals to take home. Debbi completed 2 sessions od rehab so far. she is quite limited due to some knee and hip pain that she experiences often. We  tried out the Biostep and it gave her more hip pain. She did well on the T5Nustep. Her cardiologist provided her with BP restrictions for exercise and will continue to work with Dr. Okey Dupre to figure out where her guidelines will be as it appeared that her BP went beyond her restriction, however, we are then limiting the patient with her exercise as she feels she can do more as she felt good. We are awaiting to hear back from Dr. Okey Dupre on next steps. We will continue to monitor as she progresses in the program. Debbi is doing well in the program. She has improved to level 2 on the T5 nustep. Her cardiologist provided her with BP restrictions for exercise which has limited her progress. She states that she feels she can do more as she has exceeded her BP restriction but was stopped to lower her BP. She began walking the track and walked 4 laps before being stopped due to her BP going beyond her restriction. We will continue to monitor her progress in the program. Reviewed home exercise with pt today.  Pt plans to rejoin [employer] and use stationary bike at home for exercise.  Reviewed THR, pulse, RPE, sign and symptoms, pulse oximetery and when to call 911 or MD.  Also discussed weather considerations and indoor options.  Pt voiced understanding. Debbi is doing well in rehab. She is has been doing 30 min on the T5 NuStep.  We will continue to encourage her to try to walk and monitor her  progress.   Expected Outcomes Short: Use RPE daily to regulate intensity.  Long: Follow program prescription in THR. Short: Continue initial exercise prescription, monitor BP closely and hear back from Dr.End Long: Build up overall strength and stamina Short: Continue to progress workloads without exceeding BP restrictions. Long: Continue to improve strength and stamina. Short: add1-2 days a week of at home exercise on off days of rehab. Long: become independent with exercise routine. Short: Continue to try to walk more Long: Continue to improve stamina            Discharge Exercise Prescription (Final Exercise Prescription Changes):  Exercise Prescription Changes - 02/05/23 1600       Response to Exercise   Blood Pressure (Admit) 112/78    Blood Pressure (Exercise) 158/90    Blood Pressure (Exit) 136/80    Heart Rate (Admit) 98 bpm    Heart Rate (Exercise) 105 bpm    Heart Rate (Exit) 82 bpm    Rating of Perceived Exertion (Exercise) 12    Symptoms none    Duration Continue with 30 min of aerobic exercise without signs/symptoms of physical distress.    Intensity THRR unchanged      Progression   Progression Continue to progress workloads to maintain intensity without signs/symptoms of physical distress.    Average METs 2      Resistance Training   Training Prescription Yes    Weight 3 lb    Reps 10-15      Interval Training   Interval Training No      T5 Nustep   Level 2    Minutes 30    METs 2      Home Exercise Plan   Plans to continue exercise at [employer] (comment)   re-join [employer], and seated bike at home   Frequency Add 2 additional days to program exercise sessions.    Initial Home Exercises Provided 01/31/23      Oxygen  Maintain Oxygen Saturation 88% or higher             Nutrition:  Target Goals: Understanding of nutrition guidelines, daily intake of sodium 1500mg , cholesterol 200mg , calories 30% from fat and 7% or less from  saturated fats, daily to have 5 or more servings of fruits and vegetables.  Education: All About Nutrition: -Group instruction provided by verbal, written material, interactive activities, discussions, models, and posters to present general guidelines for heart healthy nutrition including fat, fiber, MyPlate, the role of sodium in heart healthy nutrition, utilization of the nutrition label, and utilization of this knowledge for meal planning. Follow up email sent as well. Written material given at graduation. Flowsheet Row Cardiac Rehab from 01/31/2023 in [facility] Cardiac and Pulmonary Rehab  Education need identified 01/03/23       Biometrics:  Pre Biometrics - 01/03/23 1701       Pre Biometrics   Height 5' 9.75" (1.772 m)    Weight 442 lb 3.2 oz (200.6 kg)    BMI (Calculated) 63.88    Single Leg Stand 2.03 seconds              Nutrition Therapy Plan and Nutrition Goals:  Nutrition Therapy & Goals - 01/03/23 1651       Nutrition Therapy   RD appointment deferred Yes   Deferred- enrolled in a group at [facility] that includes a dietician     Intervention Plan   Intervention Prescribe, educate and counsel regarding individualized specific dietary modifications aiming towards targeted core components such as weight, hypertension, lipid management, diabetes, heart failure and other comorbidities.    Expected Outcomes Short Term Goal: Understand basic principles of dietary content, such as calories, fat, sodium, cholesterol and nutrients.;Short Term Goal: A plan has been developed with personal nutrition goals set during dietitian appointment.;Long Term Goal: Adherence to prescribed nutrition plan.             Nutrition Assessments:  MEDIFICTS Score Key: ?70 Need to make dietary changes  40-70 Heart Healthy Diet ? 40 Therapeutic Level Cholesterol Diet  Flowsheet Row Cardiac Rehab from 01/03/2023 in [facility] Cardiac and Pulmonary Rehab  Picture Your Plate Total Score on Admission 63       Picture Your Plate Scores: <16 Unhealthy dietary pattern with much room for improvement. 41-50 Dietary pattern unlikely to meet recommendations for good health and room for improvement. 51-60 More healthful dietary pattern, with some room for improvement.  >60 Healthy dietary pattern, although there may be some specific behaviors that could be improved.    Nutrition Goals Re-Evaluation:  Nutrition Goals Re-Evaluation     Row Name 01/31/23 1752             Goals   Current Weight 446 lb (202.3 kg)       Nutrition Goal Meet with RD       Comment Debbie wnats to continue with a keto diet but sometimes finds herself eating the wrong foods.       Expected Outcome Short: meet with the RD. Long: adhere a diet that adheres to her.                Nutrition Goals Discharge (Final Nutrition Goals Re-Evaluation):  Nutrition Goals Re-Evaluation - 01/31/23 1752       Goals   Current Weight 446 lb (202.3 kg)    Nutrition Goal Meet with RD    Comment Debbie wnats to continue with a keto diet but sometimes finds herself eating the  wrong foods.    Expected Outcome Short: meet with the RD. Long: adhere a diet that adheres to her.             Psychosocial: Target Goals: Acknowledge presence or absence of significant depression and/or stress, maximize coping skills, provide positive support system. Participant is able to verbalize types and ability to use techniques and skills needed for reducing stress and depression.   Education: Stress, Anxiety, and Depression - Group verbal and visual presentation to define topics covered.  Reviews how body is impacted by stress, anxiety, and depression.  Also discusses healthy ways to reduce stress and to treat/manage anxiety and depression.  Written material given at graduation.   Education: Sleep Hygiene -Provides group verbal and written instruction about how sleep can affect your health.  Define sleep hygiene, discuss sleep cycles and  impact of sleep habits. Review good sleep hygiene tips.    Initial Review & Psychosocial Screening:  Initial Psych Review & Screening - 12/27/22 1427       Initial Review   Current issues with Current Psychotropic Meds;Current Depression;Current Anxiety/Panic;Current Stress Concerns    Source of Stress Concerns Family      Family Dynamics   Good Support System? Yes   lives with sister, parents live nearby too, Son     Barriers   Psychosocial barriers to participate in program The patient should benefit from training in stress management and relaxation.;Psychosocial barriers identified (see note)      Screening Interventions   Interventions Encouraged to exercise;Provide feedback about the scores to participant;To provide support and resources with identified psychosocial needs    Expected Outcomes Short Term goal: Utilizing psychosocial counselor, staff and physician to assist with identification of specific Stressors or current issues interfering with healing process. Setting desired goal for each stressor or current issue identified.;Long Term Goal: Stressors or current issues are controlled or eliminated.;Short Term goal: Identification and review with participant of any Quality of Life or Depression concerns found by scoring the questionnaire.;Long Term goal: The participant improves quality of Life and PHQ9 Scores as seen by post scores and/or verbalization of changes             Quality of Life Scores:   Quality of Life - 01/03/23 1651       Quality of Life   Select Quality of Life      Quality of Life Scores   Health/Function Pre 17.2 %    Socioeconomic Pre 23.63 %    Psych/Spiritual Pre 17.79 %    Family Pre 19.2 %    GLOBAL Pre 19.07 %            Scores of 19 and below usually indicate a poorer quality of life in these areas.  A difference of  2-3 points is a clinically meaningful difference.  A difference of 2-3 points in the total score of the Quality of Life  Index has been associated with significant improvement in overall quality of life, self-image, physical symptoms, and general health in studies assessing change in quality of life.  PHQ-9: Review Flowsheet       01/03/2023  Depression screen PHQ 2/9  Decreased Interest 0  Down, Depressed, Hopeless 1  PHQ - 2 Score 1  Altered sleeping 0  Tired, decreased energy 2  Change in appetite 0  Feeling bad or failure about yourself  0  Trouble concentrating 0  Moving slowly or fidgety/restless 0  Suicidal thoughts 0  PHQ-9 Score 3  Difficult doing work/chores Somewhat difficult   Interpretation of Total Score  Total Score Depression Severity:  1-4 = Minimal depression, 5-9 = Mild depression, 10-14 = Moderate depression, 15-19 = Moderately severe depression, 20-27 = Severe depression   Psychosocial Evaluation and Intervention:  Psychosocial Evaluation - 01/03/23 1658       Psychosocial Evaluation & Interventions   Comments Patient states she feels well mentally at this time compared to how she used to feel about 10 years ago when she was in a "darker" place. 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 She is excited to start the program and denies any other SI concerns at this time. Encouraged patient to notify if anything changes or starts to feel worse.             Psychosocial Re-Evaluation:  Psychosocial Re-Evaluation     Row Name 01/31/23 1757             Psychosocial Re-Evaluation   Current issues with Current Psychotropic Meds;Current Sleep Concerns;History of Depression       Comments Her son is dating someone who is the same age as her but has broke up with her. The ex girlfriend of her son is trying to make waves and get into her family buisness. It has been a stressor on her since her son was dating her. She is getting up alot in the night the use the bathroom and is  interupting her sleep.       Expected Outcomes Short: Attend HeartTrack stress management education to decrease stress. Long: Maintain exercise Post HeartTrack to keep stress at a minimum.       Interventions Encouraged to attend Cardiac Rehabilitation for the exercise       Continue Psychosocial Services  Follow up required by staff                Psychosocial Discharge (Final Psychosocial Re-Evaluation):  Psychosocial Re-Evaluation - 01/31/23 1757       Psychosocial Re-Evaluation   Current issues with Current Psychotropic Meds;Current Sleep Concerns;History of Depression    Comments Her son is dating someone who is the same age as her but has broke up with her. The ex girlfriend of her son is trying to make waves and get into her family buisness. It has been a stressor on her since her son was dating her. She is getting up alot in the night the use the bathroom and is interupting her sleep.    Expected Outcomes Short: Attend HeartTrack stress management education to decrease stress. Long: Maintain exercise Post HeartTrack to keep stress at a minimum.    Interventions Encouraged to attend Cardiac Rehabilitation for the exercise    Continue Psychosocial Services  Follow up required by staff             Vocational Rehabilitation: Provide vocational rehab assistance to qualifying candidates.   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 Participant will state understanding/return demonstration of topics presented as noted by education  test scores.  Learning Barriers/Preferences:  Learning Barriers/Preferences -  12/27/22 1425       Learning Barriers/Preferences   Learning Barriers Sight   glasses   Learning Preferences Group Instruction;Individual Instruction;Skilled Demonstration;Verbal Instruction;Written Material             General Cardiac Education Topics:  AED/CPR: - Group verbal and written instruction with the use of models to demonstrate the basic use of the AED with the basic ABC's of resuscitation.   Anatomy and Cardiac Procedures: - Group verbal and visual presentation and models provide information about basic cardiac anatomy and function. Reviews the testing methods done to diagnose heart disease and the outcomes of the test results. Describes the treatment choices: Medical Management, Angioplasty, or Coronary Bypass Surgery for treating various heart conditions including Myocardial Infarction, Angina, Valve Disease, and Cardiac Arrhythmias.  Written material given at graduation. Flowsheet Row Cardiac Rehab from 01/31/2023 in [facility] Cardiac and Pulmonary Rehab  Education need identified 01/03/23       Medication Safety: - Group verbal and visual instruction to review commonly prescribed medications for heart and lung disease. Reviews the medication, class of the drug, and side effects. Includes the steps to properly store meds and maintain the prescription regimen.  Written material given at graduation.   Intimacy: - Group verbal instruction through game format to discuss how heart and lung disease can affect sexual intimacy. Written material given at graduation.. Flowsheet Row Cardiac Rehab from 01/31/2023 in [facility] Cardiac and Pulmonary Rehab  Education need identified 01/03/23  Date 01/31/23  Educator [facility]  Instruction Review Code 1- Verbalizes Understanding       Know Your Numbers and Heart Failure: - Group verbal and visual instruction to discuss disease risk factors for cardiac and pulmonary disease and treatment options.  Reviews associated critical values for  Overweight/Obesity, Hypertension, Cholesterol, and Diabetes.  Discusses basics of heart failure: signs/symptoms and treatments.  Introduces Heart Failure Zone chart for action plan for heart failure.  Written material given at graduation.   Infection Prevention: - Provides verbal and written material to individual with discussion of infection control including proper hand washing and proper equipment cleaning during exercise session. Flowsheet Row Cardiac Rehab from 12/27/2022 in [facility] Cardiac and Pulmonary Rehab  Date 12/27/22  Educator [facility]  Instruction Review Code 1- Verbalizes Understanding       Falls Prevention: - Provides verbal and written material to individual with discussion of falls prevention and safety. Flowsheet Row Cardiac Rehab from 12/27/2022 in [facility] Cardiac and Pulmonary Rehab  Date 12/27/22  Educator [facility]  Instruction Review Code 1- Verbalizes Understanding       Other: -Provides group and verbal instruction on various topics (see comments)   Knowledge Questionnaire Score:  Knowledge Questionnaire Score - 01/03/23 1651       Knowledge Questionnaire Score   Pre Score 23/26             Core Components/Risk Factors/Patient Goals at Admission:  Personal Goals and Risk Factors at Admission - 01/03/23 1714       Core Components/Risk Factors/Patient Goals on Admission    Weight Management Yes;Obesity;Weight Loss    Intervention Weight Management: Develop a combined nutrition and exercise program designed to reach desired caloric intake, while maintaining appropriate intake of nutrient and fiber, sodium and fats, and appropriate energy expenditure required for the weight goal.;Weight Management: Provide education and appropriate resources to help participant work on and attain dietary goals.;Weight Management/Obesity: Establish reasonable short term and long term weight  goals.;Obesity: Provide education and appropriate resources to help participant work on and attain  dietary goals.    Admit Weight 442 lb (200.5 kg)    Goal Weight: Short Term 436 lb (197.8 kg)    Goal Weight: Long Term 200 lb (90.7 kg)   per patient goal   Expected Outcomes Short Term: Continue to assess and modify interventions until short term weight is achieved;Long Term: Adherence to nutrition and physical activity/exercise program aimed toward attainment of established weight goal;Weight Loss: Understanding of general recommendations for a balanced deficit meal plan, which promotes 1-2 lb weight loss per week and includes a negative energy balance of [phone] kcal/d;Understanding recommendations for meals to include 15-35% energy as protein, 25-35% energy from fat, 35-60% energy from carbohydrates, less than 200mg  of dietary cholesterol, 20-35 gm of total fiber daily;Understanding of distribution of calorie intake throughout the day with the consumption of 4-5 meals/snacks    Hypertension Yes    Intervention Provide education on lifestyle modifcations including regular physical activity/exercise, weight management, moderate sodium restriction and increased consumption of fresh fruit, vegetables, and low fat dairy, alcohol moderation, and smoking cessation.;Monitor prescription use compliance.    Expected Outcomes Short Term: Continued assessment and intervention until BP is < 140/60mm HG in hypertensive participants. < 130/109mm HG in hypertensive participants with diabetes, heart failure or chronic kidney disease.;Long Term: Maintenance of blood pressure at goal levels.    Lipids Yes    Intervention Provide education and support for participant on nutrition & aerobic/resistive exercise along with prescribed medications to achieve LDL 70mg , HDL >40mg .    Expected Outcomes Short Term: Participant states understanding of desired cholesterol values and is compliant with medications prescribed. Participant is following exercise prescription and nutrition guidelines.;Long Term: Cholesterol controlled  with medications as prescribed, with individualized exercise RX and with personalized nutrition plan. Value goals: LDL < 70mg , HDL > 40 mg.             Education:Diabetes - Individual verbal and written instruction to review signs/symptoms of diabetes, desired ranges of glucose level fasting, after meals and with exercise. Acknowledge that pre and post exercise glucose checks will be done for 3 sessions at entry of program.   Core Components/Risk Factors/Patient Goals Review:   Goals and Risk Factor Review     Row Name 01/31/23 1756             Core Components/Risk Factors/Patient Goals Review   Personal Goals Review Weight Management/Obesity       Review Debbi would like to work on weight loss. She is working with her sister to come up with a diet plan. She also wants to meet with the RD to go over her diet.       Expected Outcomes Short: lose 5 poiunds in the next few weeks. Long: Reach weight goal.                Core Components/Risk Factors/Patient Goals at Discharge (Final Review):   Goals and Risk Factor Review - 01/31/23 1756       Core Components/Risk Factors/Patient Goals Review   Personal Goals Review Weight Management/Obesity    Review Debbi would like to work on weight loss. She is working with her sister to come up with a diet plan. She also wants to meet with the RD to go over her diet.    Expected Outcomes Short: lose 5 poiunds in the next few weeks. Long: Reach weight goal.  ITP Comments:  ITP Comments     Row Name 12/27/22 1441 01/03/23 1649 01/04/23 1714 01/10/23 0800     ITP Comments Completed virtual orientation today.  EP evaluation is scheduled for Wed 5/8 at 230.  Documentation for diagnosis can be found in [facility] encounter 12/12/22. Completed and gym orientation. Initial ITP created and sent for review to Dr. Bethann Punches, Medical Director. Provided handouts on stress management, breathing techniques and progressive muscular  relaxation. Reviewed education on stress management as that is patient's biggest concern at this time- talked about importance on control. First full day of exercise!  Patient was oriented to gym and equipment including functions, settings, policies, and procedures.  Patient's individual exercise prescription and treatment plan were reviewed.  All starting workloads were established based on the results of the 6 minute walk test done at initial orientation visit.  The plan for exercise progression was also introduced and progression will be customized based on patient's performance and goals. 30 Day review completed. Medical Director ITP review done, changes made as directed, and signed approval by Medical Director.   new to program             Comments:

## 2023-02-09 ENCOUNTER — Ambulatory Visit: Payer: BC Managed Care – PPO | Attending: Cardiology | Admitting: Cardiology

## 2023-02-09 ENCOUNTER — Encounter: Payer: Self-pay | Admitting: Cardiology

## 2023-02-09 VITALS — BP 122/76 | HR 90 | Ht 69.0 in | Wt >= 6400 oz

## 2023-02-09 DIAGNOSIS — I2542 Coronary artery dissection: Secondary | ICD-10-CM | POA: Diagnosis not present

## 2023-02-09 DIAGNOSIS — H34212 Partial retinal artery occlusion, left eye: Secondary | ICD-10-CM

## 2023-02-09 DIAGNOSIS — G4730 Sleep apnea, unspecified: Secondary | ICD-10-CM | POA: Diagnosis not present

## 2023-02-09 DIAGNOSIS — I214 Non-ST elevation (NSTEMI) myocardial infarction: Secondary | ICD-10-CM | POA: Diagnosis not present

## 2023-02-09 DIAGNOSIS — R0602 Shortness of breath: Secondary | ICD-10-CM

## 2023-02-09 MED ORDER — METOPROLOL SUCCINATE ER 50 MG PO TB24: 50.0000 mg | ORAL_TABLET | Freq: Every evening | ORAL | 3 refills | Status: DC

## 2023-02-09 NOTE — Patient Instructions (Signed)
Medication Instructions:  Your physician has recommended you make the following change in your medication:  metoprolol succinate (TOPROL-XL) 50 MG 24 hr tablet - Take 1 tablet (50 mg total) by mouth every evening  *If you need a refill on your cardiac medications before your next appointment, please call your pharmacy*   Lab Work: -None ordered  Testing/Procedures: Your physician has requested that you have a carotid duplex. This test is an ultrasound of the carotid arteries in your neck. It looks at blood flow through these arteries that supply the brain with blood. Allow one hour for this exam. There are no restrictions or special instructions.   Follow-Up: At [facility], you and your health needs are our priority.  As part of our continuing mission to provide you with exceptional heart care, we have created designated Provider Care Teams.  These Care Teams include your primary Cardiologist (physician) and Advanced Practice Providers (APPs -  Physician Assistants and Nurse Practitioners) who all work together to provide you with the care you need, when you need it.  Your next appointment:   After testing is completed  Provider:   [name], MD    Other Instructions -None

## 2023-02-09 NOTE — Progress Notes (Signed)
Cardiology Office Note:  .   Date:  02/09/2023  ID:  Claudia Campbell, DOB [birth year], MRN [id number] PCP: Patrice Paradise, MD  [facility] [place] [facility] Providers Cardiologist:  None    History of Present Illness: .   Claudia Campbell is a 52 y.o. female with a past medical history of anxiety, panic esophagus, depression, gastroesophageal reflux disease, hypothyroidism status post ablation, kidney stones, migraines, restless leg syndrome, sleep apnea not currently on CPAP, vitamin D deficiency, family history of coronary artery disease, and NSTEMI/SCAD who is here today for follow-up.  Patient previously been found to be bradycardic and encouraged to follow-up with a sleep study completed as well discontinue her weight loss journey.  She lost 25 pounds and remains chest pain-free with no anginal symptoms.  She was also followed with EP for previous syncopal episodes and was deemed a vasovagal event in nature.  Unfortunately she presented to the [facility] emergency department 11/2022 with nausea and dry heaves, high-sensitivity troponin peaked at 1559, she was placed on heparin infusion was taken for left heart catheterization.  Catheterization revealed single-vessel coronary artery disease with irregular tapering in the distal LAD and small D4 branch with up to 70% stenosis.  Findings are suspicious for scad.  No significant disease observed in LMCA, left circumflex or RCA.  She was placed on 81 mg of aspirin daily and a follow-up echocardiogram which revealed LVEF 50-55%, no valvular abnormalities were noted.  She was then subsequently discharged on 12/15/2022.  Was last seen in clinic and overall been doing well she had a few episodes of palpitations and continued to have shortness of breath and dyspnea on exertion.  This is primarily worse with stairs.  She was sent for labs and was sent to cardiac rehab.  She returns to clinic today accompanied by her sister.  Overall she has been doing fairly well.  She is  concerned today as she is unable to increase her reach at the necessary stage cardiac rehab because she is still on the limitations of the blood pressure.  She did have 1 episode where she had some discomfort in her back until the triage line and spontaneously resolved.  She was think potentially she may have orthostatic follow-up just little better over it as it was a different feeling.  She has been encouraged by cardiac rehab to increase her activity on but she is nervous about doing that.  She was told that she did not increase her stage due to elevated blood pressures.  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  Today she denies any chest pain, worsening shortness of breath, palpitations, or peripheral edema.  She denies any recent hospitalizations or visits to the emergency department.  ROS: 10 point review of systems has been completed and considered negative with the exception of what is listed in the HPI.  Studies Reviewed: Marland Kitchen    EKG: Sinus rhythm with first-degree AV block with a rate of 90  TTE 12/13/22 1. Left ventricular ejection fraction, by estimation, is 50 to 55%. The  left ventricle has low normal function. The left ventricle demonstrates  regional wall motion abnormalities (hypokinesis of the mid to distal  anterior,anteroseptal and apical  region). Left ventricular diastolic parameters were normal.   2. Right ventricular systolic function is normal. The right ventricular  size is normal. Tricuspid regurgitation signal is inadequate for assessing  PA pressure.  3. The mitral valve is normal in structure. No evidence of mitral valve  regurgitation. No evidence of mitral stenosis.   4. The aortic valve is normal in structure. Aortic valve regurgitation is  not visualized. No aortic stenosis is present.   5. The inferior vena cava is normal in size with greater than 50%   respiratory variability, suggesting right atrial pressure of 3 mmHg.      [facility]HC 12/10/22 Conclusions: Single-vessel coronary artery disease with irregular tapering distal LAD and small D4 branch with up to 70% stenosis.  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  No significant disease observed in the LMCA, LCx, or RCA. Probably mildly reduced left ventricular systolic function with apical hypokinesis, though suboptimal LV opacification limits evaluation (LVEF 45-50%). Moderately elevated left ventricular filling pressure (LVEDP 25-30 mmHg).   Recommendations: Continue aspirin 81 mg daily.  Defer addition of P2Y12 and other antithrombotic agents in the setting of spontaneous coronary artery dissection. Follow-up echocardiogram. Gentle diuresis and escalation of goal-directed medical therapy as tolerated. Recommend observation at least 1-2 more nights to ensure patient does not have recurrent chest pain to suggest propagation/recurrence of SCAD.   [name], MD [facility] Risk Assessment/Calculations:             Physical Exam:   VS:  BP 122/76   Pulse 90   Ht 5\' 9"  (1.753 m)   Wt (!) 443 lb 12.8 oz (201.3 kg)   SpO2 95%   BMI 65.54 kg/m    Wt Readings from Last 3 Encounters:  02/09/23 (!) 443 lb 12.8 oz (201.3 kg)  01/03/23 (!) 442 lb 3.2 oz (200.6 kg)  12/22/22 (!) 443 lb (200.9 kg)    GEN: Well nourished, well developed in no acute distress NECK: No JVD; No carotid bruits CARDIAC: RRR, no murmurs, rubs, gallops RESPIRATORY:  Clear to auscultation without rales, wheezing or rhonchi  ABDOMEN: Soft, non-tender, non-distended EXTREMITIES:  No edema; No deformity   ASSESSMENT AND PLAN: .   NSTEMI/scad with single-vessel coronary artery disease with irregular tapering of the distal LAD and small T4 branch with 70% stenosis.  Findings were suspicious for scad.  No significant disease observed.  She denies any chest pain since hospital discharge  she did have 1 episode of back discomfort.  EKG today reveals sinus rhythm with first-degree AV block without any ischemic changes.  She has been continued on aspirin 81 mg daily and atorvastatin 40 mg daily and Nitrostat as needed.  She is also encouraged to continue with cardiac rehab.  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  This is primarily noted on the left side.  Of note the patient's father has had a stroke that affected vision in 1 eye.  She has been scheduled for carotid duplex.  Elevated blood pressures without the diagnoses of hypertension and cardiac rehab with her losartan recently increased from 12.5 mg daily to 25 mg daily with stable labs.  With stable blood pressure today she has noted to have an elevated heart rate her Toprol-XL was been increased to 50 mg daily.  Encouraged to monitor blood pressure at home 1 to 2 hours after taking medications.  Obstructive sleep apnea not currently on CPAP  Post ablative hypothyroidism.  She is continued on levothyroxine.  Continues to be monitored by PCP.  Morbid obesity with a BMI of 69.54 she is encouraged to continue to increase her activity as tolerated as well as to continue with her dietary and  lifestyle modification.       Dispo: Patient is to return to clinic to see primary cardiologist Dr. Okey Dupre in 2 months or sooner if needed.  She is encouraged to continue with cardiac rehab.  Results of her carotid duplex will be sent via MyChart.  Signed, Jordan Pardini, NP

## 2023-02-12 ENCOUNTER — Encounter: Payer: BC Managed Care – PPO | Admitting: *Deleted

## 2023-02-12 DIAGNOSIS — I2542 Coronary artery dissection: Secondary | ICD-10-CM

## 2023-02-12 DIAGNOSIS — I21A1 Myocardial infarction type 2: Secondary | ICD-10-CM | POA: Diagnosis not present

## 2023-02-12 NOTE — Progress Notes (Signed)
Daily Session Note  Patient Details  Name: Claudia Campbell MRN: [id number] Date of Birth: [birth year] Referring Provider:   Flowsheet Row Cardiac Rehab from 01/03/2023 in [facility] Cardiac and Pulmonary Rehab  Referring Provider End, Cristal Deer MD       Encounter Date: 02/12/2023  Check In:  Session Check In - 02/12/23 1723       Check-In   Supervising physician immediately available to respond to emergencies See telemetry face sheet for immediately available ER MD    Location [facility]-Cardiac & Pulmonary Rehab    Staff Present Susann Givens, RN BSN;Joseph [place], RCP,RRT,BSRT;Noah [place], [place], Exercise Physiologist    Virtual Visit No    Medication changes reported     Yes    Comments increased metoprolol    Fall or balance concerns reported    No    Warm-up and Cool-down Performed on first and last piece of equipment    Resistance Training Performed Yes    VAD Patient? No      Pain Assessment   Currently in Pain? No/denies                Social History   Tobacco Use  Smoking Status Never  Smokeless Tobacco Never    Goals Met:  Independence with exercise equipment Exercise tolerated well No report of concerns or symptoms today Strength training completed today  Goals Unmet:  Not Applicable  Comments: Pt able to follow exercise prescription today without complaint.  Will continue to monitor for progression.    Dr. Bethann Punches is Medical Director for [facility] Cardiac Rehabilitation.  Dr. Vida Rigger is Medical Director for [facility] Pulmonary Rehabilitation.

## 2023-02-19 ENCOUNTER — Encounter: Payer: BC Managed Care – PPO | Admitting: *Deleted

## 2023-02-19 DIAGNOSIS — I21A1 Myocardial infarction type 2: Secondary | ICD-10-CM | POA: Diagnosis not present

## 2023-02-19 DIAGNOSIS — I2542 Coronary artery dissection: Secondary | ICD-10-CM

## 2023-02-19 NOTE — Progress Notes (Signed)
Daily Session Note  Patient Details  Name: Claudia Campbell MRN: [id number] Date of Birth: 22-Dec-1970 Referring Provider:   Flowsheet Row Cardiac Rehab from 01/03/2023 in [facility] Cardiac and Pulmonary Rehab  Referring Provider End, Cristal Deer MD       Encounter Date: 02/19/2023  Check In:  Session Check In - 02/19/23 1723       Check-In   Supervising physician immediately available to respond to emergencies See telemetry face sheet for immediately available ER MD    Location [facility]-Cardiac & Pulmonary Rehab    Staff Present Susann Givens, RN BSN;Noah Tickle, BS, Exercise Physiologist;Laureen Manson Passey, BS, RRT, CPFT    Virtual Visit No    Medication changes reported     No    Fall or balance concerns reported    No    Warm-up and Cool-down Performed on first and last piece of equipment    Resistance Training Performed Yes    VAD Patient? No    PAD/SET Patient? No      Pain Assessment   Currently in Pain? No/denies                Social History   Tobacco Use  Smoking Status Never  Smokeless Tobacco Never    Goals Met:  Independence with exercise equipment Exercise tolerated well No report of concerns or symptoms today Strength training completed today  Goals Unmet:  Not Applicable  Comments: Pt able to follow exercise prescription today without complaint.  Will continue to monitor for progression.    Dr. Bethann Punches is Medical Director for [facility] Cardiac Rehabilitation.  Dr. Vida Rigger is Medical Director for [facility] Pulmonary Rehabilitation.

## 2023-02-21 ENCOUNTER — Encounter: Payer: BC Managed Care – PPO | Admitting: *Deleted

## 2023-02-21 DIAGNOSIS — I21A1 Myocardial infarction type 2: Secondary | ICD-10-CM | POA: Diagnosis not present

## 2023-02-21 DIAGNOSIS — I2542 Coronary artery dissection: Secondary | ICD-10-CM | POA: Diagnosis not present

## 2023-02-21 NOTE — Progress Notes (Signed)
Daily Session Note  Patient Details  Name: Claudia Campbell MRN: [id number] Date of Birth: Nov 07, 1970 Referring Provider:   Flowsheet Row Cardiac Rehab from 01/03/2023 in [facility] Cardiac and Pulmonary Rehab  Referring Provider End, Cristal Deer MD       Encounter Date: 02/21/2023  Check In:  Session Check In - 02/21/23 1730       Check-In   Supervising physician immediately available to respond to emergencies See telemetry face sheet for immediately available ER MD    Location [facility]-Cardiac & Pulmonary Rehab    Staff Present Susann Givens, RN BSN;Laureen Manson Passey, BS, RRT, CPFT;Kelly Madilyn Fireman, BS, ACSM CEP, Exercise Physiologist    Virtual Visit No    Medication changes reported     No    Fall or balance concerns reported    No    Warm-up and Cool-down Performed on first and last piece of equipment    Resistance Training Performed Yes    VAD Patient? No    PAD/SET Patient? No      Pain Assessment   Currently in Pain? No/denies                Social History   Tobacco Use  Smoking Status Never  Smokeless Tobacco Never    Goals Met:  Independence with exercise equipment Exercise tolerated well No report of concerns or symptoms today Strength training completed today  Goals Unmet:  Not Applicable  Comments: Pt able to follow exercise prescription today without complaint.  Will continue to monitor for progression.    Dr. Bethann Punches is Medical Director for [facility] Cardiac Rehabilitation.  Dr. Vida Rigger is Medical Director for [facility] Pulmonary Rehabilitation.

## 2023-02-26 ENCOUNTER — Encounter: Payer: BC Managed Care – PPO | Attending: Internal Medicine | Admitting: *Deleted

## 2023-02-26 DIAGNOSIS — I495 Sick sinus syndrome: Secondary | ICD-10-CM | POA: Insufficient documentation

## 2023-02-26 DIAGNOSIS — Z48812 Encounter for surgical aftercare following surgery on the circulatory system: Secondary | ICD-10-CM | POA: Diagnosis not present

## 2023-02-26 DIAGNOSIS — I21A1 Myocardial infarction type 2: Secondary | ICD-10-CM

## 2023-02-26 DIAGNOSIS — I214 Non-ST elevation (NSTEMI) myocardial infarction: Secondary | ICD-10-CM | POA: Diagnosis present

## 2023-02-26 DIAGNOSIS — I252 Old myocardial infarction: Secondary | ICD-10-CM | POA: Diagnosis not present

## 2023-02-26 DIAGNOSIS — I2542 Coronary artery dissection: Secondary | ICD-10-CM | POA: Diagnosis present

## 2023-02-26 NOTE — Progress Notes (Signed)
Daily Session Note  Patient Details  Name: Claudia Campbell MRN: [id number] Date of Birth: [birth year] Referring Provider:   Flowsheet Row Cardiac Rehab from 01/03/2023 in [facility] Cardiac and Pulmonary Rehab  Referring Provider End, Cristal Deer MD       Encounter Date: 02/26/2023  Check In:  Session Check In - 02/26/23 1715       Check-In   Supervising physician immediately available to respond to emergencies See telemetry face sheet for immediately available ER MD    Location [facility]-Cardiac & Pulmonary Rehab    Staff Present Susann Givens, RN BSN;Joseph [place], RCP,RRT,BSRT;Kelly [place], [place], ACSM CEP, Exercise Physiologist    Virtual Visit No    Medication changes reported     No    Fall or balance concerns reported    No    Warm-up and Cool-down Performed on first and last piece of equipment    Resistance Training Performed Yes    VAD Patient? No    PAD/SET Patient? No      Pain Assessment   Currently in Pain? No/denies                Social History   Tobacco Use  Smoking Status Never  Smokeless Tobacco Never    Goals Met:  Independence with exercise equipment Exercise tolerated well No report of concerns or symptoms today Strength training completed today  Goals Unmet:  Not Applicable  Comments: Pt able to follow exercise prescription today without complaint.  Will continue to monitor for progression.    Dr. Bethann Punches is Medical Director for [facility] Cardiac Rehabilitation.  Dr. Vida Rigger is Medical Director for [facility] Pulmonary Rehabilitation.

## 2023-02-28 ENCOUNTER — Ambulatory Visit: Payer: BC Managed Care – PPO | Admitting: Internal Medicine

## 2023-02-28 ENCOUNTER — Encounter: Payer: BC Managed Care – PPO | Admitting: *Deleted

## 2023-02-28 DIAGNOSIS — Z48812 Encounter for surgical aftercare following surgery on the circulatory system: Secondary | ICD-10-CM | POA: Diagnosis not present

## 2023-02-28 DIAGNOSIS — I2542 Coronary artery dissection: Secondary | ICD-10-CM

## 2023-02-28 DIAGNOSIS — I21A1 Myocardial infarction type 2: Secondary | ICD-10-CM

## 2023-02-28 NOTE — Progress Notes (Signed)
Daily Session Note  Patient Details  Name: Claudia Campbell MRN: [id number] Date of Birth: August 24, 1971 Referring Provider:   Flowsheet Row Cardiac Rehab from 01/03/2023 in [facility] Cardiac and Pulmonary Rehab  Referring Provider End, Cristal Deer MD       Encounter Date: 02/28/2023  Check In:  Session Check In - 02/28/23 1719       Check-In   Supervising physician immediately available to respond to emergencies See telemetry face sheet for immediately available ER MD    Location [facility]-Cardiac & Pulmonary Rehab    Staff Present Hulen Luster, BS, RRT, CPFT;Joseph [place], RN BSN    Virtual Visit No    Medication changes reported     No    Fall or balance concerns reported    No    Warm-up and Cool-down Performed on first and last piece of equipment    Resistance Training Performed Yes    VAD Patient? No    PAD/SET Patient? No      Pain Assessment   Currently in Pain? No/denies                Social History   Tobacco Use  Smoking Status Never  Smokeless Tobacco Never    Goals Met:  Independence with exercise equipment Exercise tolerated well No report of concerns or symptoms today Strength training completed today  Goals Unmet:  Not Applicable  Comments: Pt able to follow exercise prescription today without complaint.  Will continue to monitor for progression.    Dr. Bethann Punches is Medical Director for [facility] Cardiac Rehabilitation.  Dr. Vida Rigger is Medical Director for [facility] Pulmonary Rehabilitation.

## 2023-03-06 ENCOUNTER — Encounter: Payer: Self-pay | Admitting: *Deleted

## 2023-03-06 DIAGNOSIS — I21A1 Myocardial infarction type 2: Secondary | ICD-10-CM

## 2023-03-06 NOTE — Progress Notes (Signed)
Assessment: Comorbidities prediabetes A1C 6.0%  Accessibility to food: no concerns Digestive issues/concerns: no known food allergies  24-hours Recall: B: eggs, bacon or sausages, coffee L: pork chop (salt and pepper), green beans, shallots D: hot dog no bun   Beverages water (48-64oz), coffee Alcohol none  Pt eats away from home 1 times a month. Patient and sister do the grocery shopping and cooking  Interview conducted via phone call: Patient has been doing keto for several years now. She's aware of the strict requirements needed to stay in ketosis. Told her than if she is not in ketosis she should do a low carb diet allowing for more flexibility and balanced plates (~161WRUE as a example). She reports she lost ~50lbs following the diet and says it works for her. Educated on keto diet, potential pit falls, how protein and fat intake is higher than other diet. Explained the types of fat, sources, and how to read food labels. Encouraged her to choose heart healthy fats over poor fats like saturated or trans fat, especially given keto's high fat distribution. Reviewed 24hr food recall, and emphasized the need to choose leaner protein, include more healthy fats, veggies, and fiber. Sodium is also a concern with some of the foods she eats. Talked with her about ways to reduce intake and cut out salting at table.   Goal 1: watch sodium, less than 2000mg  per day Goal 2: Include veggies at meals

## 2023-03-06 NOTE — Progress Notes (Signed)
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 Swallow whole., Disp: 30 tablet, Rfl: 12   atorvastatin (LIPITOR) 40 MG tablet, Take 1 tablet (40 mg total) by mouth daily., Disp: 90 tablet, Rfl: 1   augmented betamethasone dipropionate (DIPROLENE-AF) 0.05 % cream, APPLY THIN FILM TO HANDS AND FEET SITES TWICE DAILY UNTIL CLEAR, Disp: , Rfl:    betamethasone dipropionate 0.05 % cream, Apply 1 Application topically daily., Disp: , Rfl:    Cholecalciferol 250 MCG (10000 UT) CAPS, Take 1,000 Units by mouth daily., Disp: , Rfl:    Ciclopirox 1 % shampoo, Apply 1 Application topically 3 (three) times a week., Disp: , Rfl:    esomeprazole (NEXIUM) 40 MG capsule, Take 40 mg by mouth 2 (two) times daily before a meal. , Disp: , Rfl:    fluocinonide (LIDEX) 0.05 % external solution, Apply 1 Application topically 2 (two) times daily., Disp: , Rfl:    fluticasone (FLONASE) 50 MCG/ACT nasal spray, Place 1 spray into both nostrils daily., Disp: , Rfl:    levothyroxine (SYNTHROID) 100 MCG tablet, Take by mouth., Disp: , Rfl:    losartan (COZAAR) 25 MG tablet, Take 1 tablet (25 mg total) by mouth daily., Disp: 90 tablet, Rfl: 3   meclizine (ANTIVERT) 25 MG tablet, Take 1 tablet (25 mg total) by mouth  3 (three) times daily as needed for dizziness., Disp: 20 tablet, Rfl: 0   metoprolol succinate (TOPROL-XL) 50 MG 24 hr tablet, Take 1 tablet (50 mg total) by mouth every evening., Disp: 90 tablet, Rfl: 3   montelukast (SINGULAIR) 10 MG tablet, Take 1 tablet by mouth at bedtime., Disp: , Rfl:    nitroGLYCERIN (NITROSTAT) 0.4 MG SL tablet, Place 1 tablet (0.4 mg total) under the tongue every 5 (five) minutes as needed for chest pain., Disp: 30 tablet, Rfl: 12   sertraline (ZOLOFT) 100 MG tablet, Take 150 mg by mouth daily. , Disp: , Rfl:    tacrolimus (PROTOPIC) 0.1 % ointment, Apply 1 Application topically 2 (two) times daily., Disp: , Rfl:   Past Medical History: Past Medical History:  Diagnosis Date   Abnormal liver function test    Anxiety    Arthritis    Asthma    Barrett esophagus    Depression    First degree AV block    GERD (gastroesophageal reflux disease)    Hypothyroidism    Post ablation   Kidney stones    Migraines    PONV (postoperative nausea and vomiting)    Restless leg    Sleep apnea    NO cpap at this time, broken   Tenosynovitis    Thyroid nodule    Vitamin D deficiency     Tobacco Use: Social History  Tobacco Use  Smoking Status Never  Smokeless Tobacco Never    Labs: Review Flowsheet       Latest Ref Rng & Units 12/14/2022  Labs for ITP Cardiac and Pulmonary Rehab  Cholestrol 0 - 200 mg/dL 161   LDL (calc) 0 - 99 mg/dL 76   HDL-C >09 mg/dL 43   Trlycerides <604 mg/dL 540      Exercise Target Goals: Exercise Program Goal: Individual exercise prescription set using results from initial 6 min walk test and THRR while considering  patient's activity barriers and safety.   Exercise Prescription Goal: Initial exercise prescription builds to 30-45 minutes a day of aerobic activity, 2-3 days per week.  Home exercise guidelines will be given to patient during program as part of exercise prescription that the participant will  acknowledge.   Education: Aerobic Exercise: - Group verbal and visual presentation on the components of exercise prescription. Introduces F.I.T.T principle from ACSM for exercise prescriptions.  Reviews F.I.T.T. principles of aerobic exercise including progression. Written material given at graduation. Flowsheet Row Cardiac Rehab from 02/28/2023 in [facility] Cardiac and Pulmonary Rehab  Education need identified 01/03/23  Date 01/31/23  Educator [facility]  Instruction Review Code 1- [employer] Understanding       Education: Resistance Exercise: - Group verbal and visual presentation on the components of exercise prescription. Introduces F.I.T.T principle from ACSM for exercise prescriptions  Reviews F.I.T.T. principles of resistance exercise including progression. Written material given at graduation.    Education: Exercise & Equipment Safety: - Individual verbal instruction and demonstration of equipment use and safety with use of the equipment. Flowsheet Row Cardiac Rehab from 02/28/2023 in [facility] Cardiac and Pulmonary Rehab  Date 12/27/22  Educator [facility]  Instruction Review Code 1- Verbalizes Understanding       Education: Exercise Physiology & General Exercise Guidelines: - Group verbal and written instruction with models to review the exercise physiology of the cardiovascular system and associated critical values. Provides general exercise guidelines with specific guidelines to those with heart or lung disease.    Education: Flexibility, Balance, Mind/Body Relaxation: - Group verbal and visual presentation with interactive activity on the components of exercise prescription. Introduces F.I.T.T principle from ACSM for exercise prescriptions. Reviews F.I.T.T. principles of flexibility and balance exercise training including progression. Also discusses the mind body connection.  Reviews various relaxation techniques to help reduce and manage stress (i.e. Deep breathing, progressive muscle relaxation,  and visualization). Balance handout provided to take home. Written material given at graduation.   Activity Barriers & Risk Stratification:  Activity Barriers & Cardiac Risk Stratification - 01/03/23 1700       Activity Barriers & Cardiac Risk Stratification   Activity Barriers Deconditioning;Muscular Weakness;Shortness of Breath;Joint Problems;Other (comment);Arthritis;Balance Concerns    Comments no cartilidge in bilateral knees, tendonitis both wrist and elbows, hip pain, hx left rotator cuff repair    Cardiac Risk Stratification Moderate             6 Minute Walk:  6 Minute Walk     Row Name 01/03/23 1703         6 Minute Walk   Phase Initial     Distance 585 feet     Walk Time 4.06 minutes     # of Rest Breaks 1  2:54-4:48     MPH 1.63     METS 1.03     RPE 11     Perceived Dyspnea  1     VO2 Peak 3.62  Symptoms Yes (comment)     Comments left sided hip pain 2/10, SOB     Resting HR 81 bpm     Resting BP 124/78     Resting Oxygen Saturation  97 %     Exercise Oxygen Saturation  during 6 min walk 96 %     Max Ex. HR 121 bpm     Max Ex. BP 162/84     2 Minute Post BP 130/80              Oxygen Initial Assessment:   Oxygen Re-Evaluation:   Oxygen Discharge (Final Oxygen Re-Evaluation):   Initial Exercise Prescription:  Initial Exercise Prescription - 01/03/23 1700       Date of Initial Exercise RX and Referring Provider   Date 01/03/23    Referring Provider End, Cristal Deer MD      Oxygen   Maintain Oxygen Saturation 88% or higher      T5 Nustep   Level 1    SPM 80    Minutes 15    METs 1      Biostep-RELP   Level 1    SPM 50    Minutes 15    METs 1      Track   Laps 9    Minutes 15    METs 1.49      Prescription Details   Frequency (times per week) 3    Duration Progress to 30 minutes of continuous aerobic without signs/symptoms of physical distress      Intensity   THRR 40-80% of Max Heartrate 117 - 153    Ratings of  Perceived Exertion 11-13    Perceived Dyspnea 0-4      Progression   Progression Continue to progress workloads to maintain intensity without signs/symptoms of physical distress.      Resistance Training   Training Prescription Yes    Weight 3 lb    Reps 10-15             Perform Capillary Blood Glucose checks as needed.  Exercise Prescription Changes:   Exercise Prescription Changes     Row Name 01/03/23 1700 01/09/23 1400 01/23/23 0800 01/31/23 1700 02/05/23 1600     Response to Exercise   Blood Pressure (Admit) 124/78 126/76 132/84 -- 112/78   Blood Pressure (Exercise) 162/84 160/84 184/100 -- 158/90   Blood Pressure (Exit) 130/80 124/86 126/78 -- 136/80   Heart Rate (Admit) 81 bpm 92 bpm 93 bpm -- 98 bpm   Heart Rate (Exercise) 121 bpm 114 bpm 119 bpm -- 105 bpm   Heart Rate (Exit) 85 bpm 91 bpm 103 bpm -- 82 bpm   Oxygen Saturation (Admit) 97 % -- -- -- --   Oxygen Saturation (Exercise) 96 % -- -- -- --   Oxygen Saturation (Exit) 97 % -- -- -- --   Rating of Perceived Exertion (Exercise) 11 13 14  -- 12   Perceived Dyspnea (Exercise) 1 -- -- -- --   Symptoms left sided hip pain 2/10, SOB hip pain -- -- none   Comments walk test results 2nd full day of exercise -- -- --   Duration -- Progress to 30 minutes of  aerobic without signs/symptoms of physical distress Progress to 30 minutes of  aerobic without signs/symptoms of physical distress Progress to 30 minutes of  aerobic without signs/symptoms of physical distress Continue with 30 min of aerobic exercise without signs/symptoms of physical distress.   Intensity -- THRR unchanged THRR unchanged  THRR unchanged THRR unchanged     Progression   Progression -- Continue to progress workloads to maintain intensity without signs/symptoms of physical distress. Continue to progress workloads to maintain intensity without signs/symptoms of physical distress. Continue to progress workloads to maintain intensity without  signs/symptoms of physical distress. Continue to progress workloads to maintain intensity without signs/symptoms of physical distress.   Average METs -- 1.9 1.9 1.9 2     Resistance Training   Training Prescription -- Yes Yes Yes Yes   Weight -- 3 lb 3 lb 3 lb 3 lb   Reps -- 10-15 10-15 10-15 10-15     Interval Training   Interval Training -- No No No No     T5 Nustep   Level -- 2 2 2 2    Minutes -- 15 15 15 30    METs -- 1.8 2 2 2      Biostep-RELP   Level -- 1 -- -- --   Minutes -- 15 -- -- --   METs -- 2 -- -- --     Track   Laps -- -- 4 4 --   Minutes -- -- 15 15 --   METs -- -- 1.22 1.22 --     Home Exercise Plan   Plans to continue exercise at -- -- -- [employer] (comment)  re-join [employer], and seated bike at home [employer] (comment)  re-join [employer], and seated bike at home   Frequency -- -- -- Add 2 additional days to program exercise sessions. Add 2 additional days to program exercise sessions.   Initial Home Exercises Provided -- -- -- 01/31/23 01/31/23     Oxygen   Maintain Oxygen Saturation -- 88% or higher 88% or higher 88% or higher 88% or higher    Row Name 02/21/23 1500             Response to Exercise   Blood Pressure (Admit) 128/76       Blood Pressure (Exercise) 162/98       Blood Pressure (Exit) 130/80       Heart Rate (Admit) 87 bpm       Heart Rate (Exercise) 117 bpm       Heart Rate (Exit) 98 bpm       Rating of Perceived Exertion (Exercise) 13       Duration Continue with 30 min of aerobic exercise without signs/symptoms of physical distress.       Intensity THRR unchanged         Progression   Progression Continue to progress workloads to maintain intensity without signs/symptoms of physical distress.       Average METs 1.9         Resistance Training   Training Prescription Yes       Weight 3 lb       Reps 10-15         Interval Training   Interval Training No         T5 Nustep   Level 4        Minutes 30       METs 1.9         Home Exercise Plan   Plans to continue exercise at [employer] (comment)  re-join [employer], and seated bike at home       Frequency Add 2 additional days to program exercise sessions.       Initial Home Exercises Provided 01/31/23  Oxygen   Maintain Oxygen Saturation 88% or higher                Exercise Comments:   Exercise Comments     Row Name 01/04/23 1714           Exercise Comments First full day of exercise!  Patient was oriented to gym and equipment including functions, settings, policies, and procedures.  Patient's individual exercise prescription and treatment plan were reviewed.  All starting workloads were established based on the results of the 6 minute walk test done at initial orientation visit.  The plan for exercise progression was also introduced and progression will be customized based on patient's performance and goals.                Exercise Goals and Review:   Exercise Goals     Row Name 01/03/23 1713             Exercise Goals   Increase Physical Activity Yes       Intervention Provide advice, education, support and counseling about physical activity/exercise needs.;Develop an individualized exercise prescription for aerobic and resistive training based on initial evaluation findings, risk stratification, comorbidities and participant's personal goals.       Expected Outcomes Short Term: Attend rehab on a regular basis to increase amount of physical activity.;Long Term: Add in home exercise to make exercise part of routine and to increase amount of physical activity.;Long Term: Exercising regularly at least 3-5 days a week.       Increase Strength and Stamina Yes       Intervention Provide advice, education, support and counseling about physical activity/exercise needs.;Develop an individualized exercise prescription for aerobic and resistive training based on initial evaluation findings,  risk stratification, comorbidities and participant's personal goals.       Expected Outcomes Short Term: Increase workloads from initial exercise prescription for resistance, speed, and METs.;Short Term: Perform resistance training exercises routinely during rehab and add in resistance training at home;Long Term: Improve cardiorespiratory fitness, muscular endurance and strength as measured by increased METs and functional capacity ( )       Able to understand and use rate of perceived exertion (RPE) scale Yes       Intervention Provide education and explanation on how to use RPE scale       Expected Outcomes Short Term: Able to use RPE daily in rehab to express subjective intensity level;Long Term:  Able to use RPE to guide intensity level when exercising independently       Able to understand and use Dyspnea scale Yes       Intervention Provide education and explanation on how to use Dyspnea scale       Expected Outcomes Short Term: Able to use Dyspnea scale daily in rehab to express subjective sense of shortness of breath during exertion;Long Term: Able to use Dyspnea scale to guide intensity level when exercising independently       Knowledge and understanding of Target Heart Rate Range (THRR) Yes       Intervention Provide education and explanation of THRR including how the numbers were predicted and where they are located for reference       Expected Outcomes Short Term: Able to state/look up THRR;Long Term: Able to use THRR to govern intensity when exercising independently;Short Term: Able to use daily as guideline for intensity in rehab       Able to check pulse independently Yes  Intervention Provide education and demonstration on how to check pulse in carotid and radial arteries.;Review the importance of being able to check your own pulse for safety during independent exercise       Expected Outcomes Long Term: Able to check pulse independently and accurately;Short Term: Able to  explain why pulse checking is important during independent exercise       Understanding of Exercise Prescription Yes       Intervention Provide education, explanation, and written materials on patient's individual exercise prescription       Expected Outcomes Short Term: Able to explain program exercise prescription;Long Term: Able to explain home exercise prescription to exercise independently                Exercise Goals Re-Evaluation :  Exercise Goals Re-Evaluation     Row Name 01/04/23 1715 01/09/23 1502 01/23/23 0854 01/31/23 1756 02/05/23 1605     Exercise Goal Re-Evaluation   Exercise Goals Review Increase Physical Activity;Able to understand and use rate of perceived exertion (RPE) scale;Knowledge and understanding of Target Heart Rate Range (THRR);Understanding of Exercise Prescription;Increase Strength and Stamina;Able to check pulse independently Increase Physical Activity;Increase Strength and Stamina;Understanding of Exercise Prescription Increase Physical Activity;Increase Strength and Stamina;Understanding of Exercise Prescription Increase Physical Activity;Increase Strength and Stamina;Understanding of Exercise Prescription;Able to understand and use rate of perceived exertion (RPE) scale;Knowledge and understanding of Target Heart Rate Range (THRR);Able to check pulse independently Increase Physical Activity;Increase Strength and Stamina;Understanding of Exercise Prescription   Comments Reviewed RPE scale, THR and program prescription with pt today.  Pt voiced understanding and was given a copy of goals to take home. Claudia Campbell completed 2 sessions od rehab so far. she is quite limited due to some knee and hip pain that she experiences often. We tried out the Biostep and it gave her more hip pain. She did well on the T5Nustep. Her cardiologist provided her with BP restrictions for exercise and will continue to work with Dr. Okey Dupre to figure out where her guidelines will be as it appeared  that her BP went beyond her restriction, however, we are then limiting the patient with her exercise as she feels she can do more as she felt good. We are awaiting to hear back from Dr. Okey Dupre on next steps. We will continue to monitor as she progresses in the program. Claudia Campbell is doing well in the program. She has improved to level 2 on the T5 nustep. Her cardiologist provided her with BP restrictions for exercise which has limited her progress. She states that she feels she can do more as she has exceeded her BP restriction but was stopped to lower her BP. She began walking the track and walked 4 laps before being stopped due to her BP going beyond her restriction. We will continue to monitor her progress in the program. Reviewed home exercise with pt today.  Pt plans to rejoin [employer] and use stationary bike at home for exercise.  Reviewed THR, pulse, RPE, sign and symptoms, pulse oximetery and when to call 911 or MD.  Also discussed weather considerations and indoor options.  Pt voiced understanding. Claudia Campbell is doing well in rehab. She is has been doing 30 min on the T5 NuStep.  We will continue to encourage her to try to walk and monitor her progress.   Expected Outcomes Short: Use RPE daily to regulate intensity.  Long: Follow program prescription in THR. Short: Continue initial exercise prescription, monitor BP closely and hear back from Dr.End  Long: Build up overall strength and stamina Short: Continue to progress workloads without exceeding BP restrictions. Long: Continue to improve strength and stamina. Short: add1-2 days a week of at home exercise on off days of rehab. Long: become independent with exercise routine. Short: Continue to try to walk more Long: Continue to improve stamina    Row Name 02/21/23 1523             Exercise Goal Re-Evaluation   Exercise Goals Review Increase Physical Activity;Increase Strength and Stamina;Understanding of Exercise Prescription       Comments Debbie  attended 2 sessions this past 2 weeks.  She continues with the T5Nustep at level 5. She is using 3 Lb hand weights.Marland Kitchen WIll ask about home exercise activity next review and continue to monitor her progress.       Expected Outcomes STG Attend all scheduled sessions. Encourage to try other equipment. LTG Conitue with exercise progression                Discharge Exercise Prescription (Final Exercise Prescription Changes):  Exercise Prescription Changes - 02/21/23 1500       Response to Exercise   Blood Pressure (Admit) 128/76    Blood Pressure (Exercise) 162/98    Blood Pressure (Exit) 130/80    Heart Rate (Admit) 87 bpm    Heart Rate (Exercise) 117 bpm    Heart Rate (Exit) 98 bpm    Rating of Perceived Exertion (Exercise) 13    Duration Continue with 30 min of aerobic exercise without signs/symptoms of physical distress.    Intensity THRR unchanged      Progression   Progression Continue to progress workloads to maintain intensity without signs/symptoms of physical distress.    Average METs 1.9      Resistance Training   Training Prescription Yes    Weight 3 lb    Reps 10-15      Interval Training   Interval Training No      T5 Nustep   Level 4    Minutes 30    METs 1.9      Home Exercise Plan   Plans to continue exercise at [employer] (comment)   re-join [employer], and seated bike at home   Frequency Add 2 additional days to program exercise sessions.    Initial Home Exercises Provided 01/31/23      Oxygen   Maintain Oxygen Saturation 88% or higher             Nutrition:  Target Goals: Understanding of nutrition guidelines, daily intake of sodium 1500mg , cholesterol 200mg , calories 30% from fat and 7% or less from saturated fats, daily to have 5 or more servings of fruits and vegetables.  Education: All About Nutrition: -Group instruction provided by verbal, written material, interactive activities, discussions, models, and posters to present  general guidelines for heart healthy nutrition including fat, fiber, MyPlate, the role of sodium in heart healthy nutrition, utilization of the nutrition label, and utilization of this knowledge for meal planning. Follow up email sent as well. Written material given at graduation. Flowsheet Row Cardiac Rehab from 02/28/2023 in [facility] Cardiac and Pulmonary Rehab  Education need identified 01/03/23       Biometrics:  Pre Biometrics - 01/03/23 1701       Pre Biometrics   Height 5' 9.75" (1.772 m)    Weight 442 lb 3.2 oz (200.6 kg)    BMI (Calculated) 63.88    Single Leg Stand 2.03 seconds  Nutrition Therapy Plan and Nutrition Goals:  Nutrition Therapy & Goals - 03/06/23 1100       Nutrition Therapy   Diet Cardiac, Low Na    Protein (specify units) 90    Fiber 25 grams    Whole Grain Foods 3 servings    Saturated Fats 15 max. grams    Fruits and Vegetables 5 servings/day    Sodium 2 grams      Personal Nutrition Goals   Nutrition Goal watch sodium, less than 2000mg  per day    Personal Goal #2 Include veggies at meals    Comments Patient has been doing keto for several years now. She's aware of the strict requirements needed to stay in ketosis. Told her than if she is not in ketosis she should do a low carb diet allowing for more flexibility and balanced plates (~[id number] as a example). She reports she lost ~50lbs following the diet and says it works for her. Educated on keto diet, potential pit falls, how protein and fat intake is higher than other diet. Explained the types of fat, sources, and how to read food labels. Encouraged her to choose heart healthy fats over poor fats like saturated or trans fat, especially given keto's high fat distribution. Reviewed 24hr food recall, and emphasized the need to choose leaner protein, include more healthy fats, veggies, and fiber. Sodium is also a concern with some of the foods she eats. Talked with her about ways to reduce intake  and cut out salting at table. 24-hours Recall:  B: eggs, bacon or sausages, coffee  L: pork chop (salt and pepper), green beans, shallots  D: hot dog no bun      Intervention Plan   Intervention Prescribe, educate and counsel regarding individualized specific dietary modifications aiming towards targeted core components such as weight, hypertension, lipid management, diabetes, heart failure and other comorbidities.;Nutrition handout(s) given to patient.    Expected Outcomes Short Term Goal: Understand basic principles of dietary content, such as calories, fat, sodium, cholesterol and nutrients.;Short Term Goal: A plan has been developed with personal nutrition goals set during dietitian appointment.;Long Term Goal: Adherence to prescribed nutrition plan.             Nutrition Assessments:  MEDIFICTS Score Key: ?70 Need to make dietary changes  40-70 Heart Healthy Diet ? 40 Therapeutic Level Cholesterol Diet  Flowsheet Row Cardiac Rehab from 01/03/2023 in [facility] Cardiac and Pulmonary Rehab  Picture Your Plate Total Score on Admission 63      Picture Your Plate Scores: <69 Unhealthy dietary pattern with much room for improvement. 41-50 Dietary pattern unlikely to meet recommendations for good health and room for improvement. 51-60 More healthful dietary pattern, with some room for improvement.  >60 Healthy dietary pattern, although there may be some specific behaviors that could be improved.    Nutrition Goals Re-Evaluation:  Nutrition Goals Re-Evaluation     Row Name 01/31/23 1752 02/28/23 1730           Goals   Current Weight 446 lb (202.3 kg) 445 lb (201.9 kg)      Nutrition Goal Meet with RD Meet with RD      Comment Debbie wnats to continue with a keto diet but sometimes finds herself eating the wrong foods. Debbie wants to meet the RD      Expected Outcome Short: meet with the RD. Long: adhere a diet that adheres to her. Short: meet with the RD. Long: adhere a diet that  adheres to her.               Nutrition Goals Discharge (Final Nutrition Goals Re-Evaluation):  Nutrition Goals Re-Evaluation - 02/28/23 1730       Goals   Current Weight 445 lb (201.9 kg)    Nutrition Goal Meet with RD    Comment Debbie wants to meet the RD    Expected Outcome Short: meet with the RD. Long: adhere a diet that adheres to her.             Psychosocial: Target Goals: Acknowledge presence or absence of significant depression and/or stress, maximize coping skills, provide positive support system. Participant is able to verbalize types and ability to use techniques and skills needed for reducing stress and depression.   Education: Stress, Anxiety, and Depression - Group verbal and visual presentation to define topics covered.  Reviews how body is impacted by stress, anxiety, and depression.  Also discusses healthy ways to reduce stress and to treat/manage anxiety and depression.  Written material given at graduation.   Education: Sleep Hygiene -Provides group verbal and written instruction about how sleep can affect your health.  Define sleep hygiene, discuss sleep cycles and impact of sleep habits. Review good sleep hygiene tips.    Initial Review & Psychosocial Screening:  Initial Psych Review & Screening - 12/27/22 1427       Initial Review   Current issues with Current Psychotropic Meds;Current Depression;Current Anxiety/Panic;Current Stress Concerns    Source of Stress Concerns Family      Family Dynamics   Good Support System? Yes   lives with sister, parents live nearby too, Son     Barriers   Psychosocial barriers to participate in program The patient should benefit from training in stress management and relaxation.;Psychosocial barriers identified (see note)      Screening Interventions   Interventions Encouraged to exercise;Provide feedback about the scores to participant;To provide support and resources with identified psychosocial needs     Expected Outcomes Short Term goal: Utilizing psychosocial counselor, staff and physician to assist with identification of specific Stressors or current issues interfering with healing process. Setting desired goal for each stressor or current issue identified.;Long Term Goal: Stressors or current issues are controlled or eliminated.;Short Term goal: Identification and review with participant of any Quality of Life or Depression concerns found by scoring the questionnaire.;Long Term goal: The participant improves quality of Life and PHQ9 Scores as seen by post scores and/or verbalization of changes             Quality of Life Scores:   Quality of Life - 01/03/23 1651       Quality of Life   Select Quality of Life      Quality of Life Scores   Health/Function Pre 17.2 %    Socioeconomic Pre 23.63 %    Psych/Spiritual Pre 17.79 %    Family Pre 19.2 %    GLOBAL Pre 19.07 %            Scores of 19 and below usually indicate a poorer quality of life in these areas.  A difference of  2-3 points is a clinically meaningful difference.  A difference of 2-3 points in the total score of the Quality of Life Index has been associated with significant improvement in overall quality of life, self-image, physical symptoms, and general health in studies assessing change in quality of life.  PHQ-9: Review Flowsheet       01/03/2023  Depression screen PHQ 2/9  Decreased Interest 0  Down, Depressed, Hopeless 1  PHQ - 2 Score 1  Altered sleeping 0  Tired, decreased energy 2  Change in appetite 0  Feeling bad or failure about yourself  0  Trouble concentrating 0  Moving slowly or fidgety/restless 0  Suicidal thoughts 0  PHQ-9 Score 3  Difficult doing work/chores Somewhat difficult   Interpretation of Total Score  Total Score Depression Severity:  1-4 = Minimal depression, 5-9 = Mild depression, 10-14 = Moderate depression, 15-19 = Moderately severe depression, 20-27 = Severe depression    Psychosocial Evaluation and Intervention:  Psychosocial Evaluation - 01/03/23 1658       Psychosocial Evaluation & Interventions   Comments Patient states she feels well mentally at this time compared to how she used to feel about 10 years ago when she was in a "darker" place. 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 She is excited to start the program and denies any other SI concerns at this time. Encouraged patient to notify if anything changes or starts to feel worse.             Psychosocial Re-Evaluation:  Psychosocial Re-Evaluation     Row Name 01/31/23 1757 02/28/23 1736           Psychosocial Re-Evaluation   Current issues with Current Psychotropic Meds;Current Sleep Concerns;History of Depression Current Psychotropic Meds;Current Sleep Concerns;History of Depression      Comments Her son is dating someone who is the same age as her but has broke up with her. The ex girlfriend of her son is trying to make waves and get into her family buisness. It has been a stressor on her since her son was dating her. She is getting up alot in the night the use the bathroom and is interupting her sleep. Her sister is going to change to a different school system for work. She may have to move again due to her sisters work. Its creating some stress since she did not want to move again,      Expected Outcomes Short: Attend [facility] stress management education to decrease stress. Long: Maintain exercise Post Heart[facility] to keep stress at a minimum. Short: Attend [facility] stress management education to decrease stress. Long: Maintain exercise Post [facility] to keep stress at a minimum.      Interventions Encouraged to attend Cardiac Rehabilitation for the exercise Encouraged to attend Cardiac Rehabilitation for the exercise      Continue Psychosocial Services  Follow up required by staff  Follow up required by staff               Psychosocial Discharge (Final Psychosocial Re-Evaluation):  Psychosocial Re-Evaluation - 02/28/23 1736       Psychosocial Re-Evaluation   Current issues with Current Psychotropic Meds;Current Sleep Concerns;History of Depression    Comments Her sister is going to change to a different school system for work. She may have to move again due to her sisters work. Its creating some stress since she did not want to move again,    Expected Outcomes Short: Attend [facility] stress management education to decrease stress. Long: Maintain exercise Post HeartTrack to keep stress at a minimum.    Interventions Encouraged to attend Cardiac Rehabilitation for the exercise    Continue Psychosocial Services  Follow up required by staff  Vocational Rehabilitation: Provide vocational rehab assistance to qualifying candidates.   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 Participant will state understanding/return demonstration of topics presented as noted by education test scores.  Learning Barriers/Preferences:  Learning Barriers/Preferences - 12/27/22 1425       Learning Barriers/Preferences   Learning Barriers Sight   glasses   Learning Preferences Group Instruction;Individual Instruction;Skilled Demonstration;Verbal Instruction;Written Material             General Cardiac Education Topics:  AED/CPR: - Group verbal and written instruction with the use of models to demonstrate the basic use of the AED with the basic ABC's of resuscitation.   Anatomy and  Cardiac Procedures: - Group verbal and visual presentation and models provide information about basic cardiac anatomy and function. Reviews the testing methods done to diagnose heart disease and the outcomes of the test results. Describes the treatment choices: Medical Management, Angioplasty, or Coronary Bypass Surgery for treating various heart conditions including Myocardial Infarction, Angina, Valve Disease, and Cardiac Arrhythmias.  Written material given at graduation. Flowsheet Row Cardiac Rehab from 02/28/2023 in [facility] Cardiac and Pulmonary Rehab  Education need identified 01/03/23  Date 02/28/23  Educator MS  Instruction Review Code 1- Verbalizes Understanding       Medication Safety: - Group verbal and visual instruction to review commonly prescribed medications for heart and lung disease. Reviews the medication, class of the drug, and side effects. Includes the steps to properly store meds and maintain the prescription regimen.  Written material given at graduation.   Intimacy: - Group verbal instruction through game format to discuss how heart and lung disease can affect sexual intimacy. Written material given at graduation.. Flowsheet Row Cardiac Rehab from 02/28/2023 in [facility] Cardiac and Pulmonary Rehab  Education need identified 01/03/23  Date 01/31/23  Educator [facility]  Instruction Review Code 1- Verbalizes Understanding       Know Your Numbers and Heart Failure: - Group verbal and visual instruction to discuss disease risk factors for cardiac and pulmonary disease and treatment options.  Reviews associated critical values for Overweight/Obesity, Hypertension, Cholesterol, and Diabetes.  Discusses basics of heart failure: signs/symptoms and treatments.  Introduces Heart Failure Zone chart for action plan for heart failure.  Written material given at graduation.   Infection Prevention: - Provides verbal and written material to individual with discussion of infection control  including proper hand washing and proper equipment cleaning during exercise session. Flowsheet Row Cardiac Rehab from 02/28/2023 in [facility] Cardiac and Pulmonary Rehab  Date 12/27/22  Educator [facility]  Instruction Review Code 1- Verbalizes Understanding       Falls Prevention: - Provides verbal and written material to individual with discussion of falls prevention and safety. Flowsheet Row Cardiac Rehab from 02/28/2023 in [facility] Cardiac and Pulmonary Rehab  Date 12/27/22  Educator [facility]  Instruction Review Code 1- Verbalizes Understanding       Other: -Provides group and verbal instruction on various topics (see comments) Flowsheet Row Cardiac Rehab from 02/28/2023 in [facility] Cardiac and Pulmonary Rehab  Date 02/21/23  Educator [employer]- [employer]  Instruction Review Code 1- Verbalizes Understanding  Knowledge Questionnaire Score:  Knowledge Questionnaire Score - 01/03/23 1651       Knowledge Questionnaire Score   Pre Score 23/26             Core Components/Risk Factors/Patient Goals at Admission:  Personal Goals and Risk Factors at Admission - 01/03/23 1714       Core Components/Risk Factors/Patient Goals on Admission    Weight Management Yes;Obesity;Weight Loss    Intervention Weight Management: Develop a combined nutrition and exercise program designed to reach desired caloric intake, while maintaining appropriate intake of nutrient and fiber, sodium and fats, and appropriate energy expenditure required for the weight goal.;Weight Management: Provide education and appropriate resources to help participant work on and attain dietary goals.;Weight Management/Obesity: Establish reasonable short term and long term weight goals.;Obesity: Provide education and appropriate resources to help participant work on and attain dietary goals.    Admit Weight 442 lb (200.5 kg)    Goal Weight: Short Term 436 lb (197.8 kg)    Goal Weight: Long Term 200 lb (90.7 kg)   per patient goal   Expected  Outcomes Short Term: Continue to assess and modify interventions until short term weight is achieved;Long Term: Adherence to nutrition and physical activity/exercise program aimed toward attainment of established weight goal;Weight Loss: Understanding of general recommendations for a balanced deficit meal plan, which promotes 1-2 lb weight loss per week and includes a negative energy balance of [phone] kcal/d;Understanding recommendations for meals to include 15-35% energy as protein, 25-35% energy from fat, 35-60% energy from carbohydrates, less than 200mg  of dietary cholesterol, 20-35 gm of total fiber daily;Understanding of distribution of calorie intake throughout the day with the consumption of 4-5 meals/snacks    Hypertension Yes    Intervention Provide education on lifestyle modifcations including regular physical activity/exercise, weight management, moderate sodium restriction and increased consumption of fresh fruit, vegetables, and low fat dairy, alcohol moderation, and smoking cessation.;Monitor prescription use compliance.    Expected Outcomes Short Term: Continued assessment and intervention until BP is < 140/71mm HG in hypertensive participants. < 130/51mm HG in hypertensive participants with diabetes, heart failure or chronic kidney disease.;Long Term: Maintenance of blood pressure at goal levels.    Lipids Yes    Intervention Provide education and support for participant on nutrition & aerobic/resistive exercise along with prescribed medications to achieve LDL 70mg , HDL >40mg .    Expected Outcomes Short Term: Participant states understanding of desired cholesterol values and is compliant with medications prescribed. Participant is following exercise prescription and nutrition guidelines.;Long Term: Cholesterol controlled with medications as prescribed, with individualized exercise RX and with personalized nutrition plan. Value goals: LDL < 70mg , HDL > 40 mg.              Education:Diabetes - Individual verbal and written instruction to review signs/symptoms of diabetes, desired ranges of glucose level fasting, after meals and with exercise. Acknowledge that pre and post exercise glucose checks will be done for 3 sessions at entry of program.   Core Components/Risk Factors/Patient Goals Review:   Goals and Risk Factor Review     Row Name 01/31/23 1756 02/28/23 1739           Core Components/Risk Factors/Patient Goals Review   Personal Goals Review Weight Management/Obesity Hypertension      Review Claudia Campbell would like to work on weight loss. She is working with her sister to come up with a diet plan. She also wants to meet with the RD to go over her diet. Claudia Campbell  has been up and down with her blood pressures, She is supposed to be 150/90 and below. She has trouble keeping her blood pressure down when she wants to move faster and do more. She does not like that she cannot work hard to get healthier and has to slow down. Her doctor will not reasses blood pressure ranges aftter Cardiac rehab.      Expected Outcomes Short: lose 5 poiunds in the next few weeks. Long: Reach weight goal. Short: talk to doctor about blood pressure. Long: maintain adequate blood pressure readings.               Core Components/Risk Factors/Patient Goals at Discharge (Final Review):   Goals and Risk Factor Review - 02/28/23 1739       Core Components/Risk Factors/Patient Goals Review   Personal Goals Review Hypertension    Review Claudia Campbell has been up and down with her blood pressures, She is supposed to be 150/90 and below. She has trouble keeping her blood pressure down when she wants to move faster and do more. She does not like that she cannot work hard to get healthier and has to slow down. Her doctor will not reasses blood pressure ranges aftter Cardiac rehab.    Expected Outcomes Short: talk to doctor about blood pressure. Long: maintain adequate blood pressure readings.              ITP Comments:  ITP Comments     Row Name 12/27/22 1441 01/03/23 1649 01/04/23 1714 01/10/23 0800 03/06/23 1357   ITP Comments Completed virtual orientation today.  EP evaluation is scheduled for Wed 5/8 at 230.  Documentation for diagnosis can be found in [facility] encounter 12/12/22. Completed and gym orientation. Initial ITP created and sent for review to Dr. Bethann Punches, Medical Director. Provided handouts on stress management, breathing techniques and progressive muscular relaxation. Reviewed education on stress management as that is patient's biggest concern at this time- talked about importance on control. First full day of exercise!  Patient was oriented to gym and equipment including functions, settings, policies, and procedures.  Patient's individual exercise prescription and treatment plan were reviewed.  All starting workloads were established based on the results of the 6 minute walk test done at initial orientation visit.  The plan for exercise progression was also introduced and progression will be customized based on patient's performance and goals. 30 Day review completed. Medical Director ITP review done, changes made as directed, and signed approval by Medical Director.   new to program 30 Day review completed. Medical Director ITP review done, changes made as directed, and signed approval by Medical Director.            Comments:

## 2023-03-07 ENCOUNTER — Encounter: Payer: BC Managed Care – PPO | Admitting: *Deleted

## 2023-03-07 DIAGNOSIS — I2542 Coronary artery dissection: Secondary | ICD-10-CM

## 2023-03-07 DIAGNOSIS — I21A1 Myocardial infarction type 2: Secondary | ICD-10-CM

## 2023-03-07 DIAGNOSIS — Z48812 Encounter for surgical aftercare following surgery on the circulatory system: Secondary | ICD-10-CM | POA: Diagnosis not present

## 2023-03-07 NOTE — Progress Notes (Signed)
Daily Session Note  Patient Details  Name: Claudia Campbell MRN: [id number] Date of Birth: May 31, 1971 Referring Provider:   Flowsheet Row Cardiac Rehab from 01/03/2023 in [facility] Cardiac and Pulmonary Rehab  Referring Provider End, Cristal Deer MD       Encounter Date: 03/07/2023  Check In:  Session Check In - 03/07/23 1648       Check-In   Supervising physician immediately available to respond to emergencies See telemetry face sheet for immediately available ER MD    Location [facility]-Cardiac & Pulmonary Rehab    Staff Present Hulen Luster, BS, RRT, CPFT;Ladesha Pacini Jewel Baize, RN BSN;Joseph Reino Kent, RCP,RRT,BSRT    Virtual Visit No    Medication changes reported     No    Fall or balance concerns reported    No    Warm-up and Cool-down Performed on first and last piece of equipment    Resistance Training Performed Yes    VAD Patient? No    PAD/SET Patient? No      Pain Assessment   Currently in Pain? No/denies                Social History   Tobacco Use  Smoking Status Never  Smokeless Tobacco Never    Goals Met:  Independence with exercise equipment Exercise tolerated well No report of concerns or symptoms today Strength training completed today  Goals Unmet:  Not Applicable  Comments: Pt able to follow exercise prescription today without complaint.  Will continue to monitor for progression.    Dr. Bethann Punches is Medical Director for [facility] Cardiac Rehabilitation.  Dr. Vida Rigger is Medical Director for [facility] Pulmonary Rehabilitation.

## 2023-03-08 ENCOUNTER — Other Ambulatory Visit: Payer: Self-pay

## 2023-03-08 ENCOUNTER — Emergency Department
Admission: EM | Admit: 2023-03-08 | Discharge: 2023-03-08 | Disposition: A | Discharge status: Home / Self Care | Payer: BC Managed Care – PPO | Attending: Student in an Organized Health Care Education/Training Program | Admitting: Student in an Organized Health Care Education/Training Program

## 2023-03-08 ENCOUNTER — Encounter: Payer: BC Managed Care – PPO | Admitting: *Deleted

## 2023-03-08 ENCOUNTER — Emergency Department: Payer: BC Managed Care – PPO

## 2023-03-08 DIAGNOSIS — R072 Precordial pain: Secondary | ICD-10-CM | POA: Diagnosis not present

## 2023-03-08 DIAGNOSIS — R0789 Other chest pain: Secondary | ICD-10-CM

## 2023-03-08 DIAGNOSIS — M25511 Pain in right shoulder: Secondary | ICD-10-CM | POA: Insufficient documentation

## 2023-03-08 DIAGNOSIS — I21A1 Myocardial infarction type 2: Secondary | ICD-10-CM

## 2023-03-08 DIAGNOSIS — I2542 Coronary artery dissection: Secondary | ICD-10-CM

## 2023-03-08 LAB — CBC WITH DIFFERENTIAL/PLATELET
Abs Immature Granulocytes: 0.05 10*3/uL (ref 0.00–0.07)
Basophils Absolute: 0.1 10*3/uL (ref 0.0–0.1)
Basophils Relative: 1 %
Eosinophils Absolute: 0.4 10*3/uL (ref 0.0–0.5)
Eosinophils Relative: 4 %
HCT: 40.9 % (ref 36.0–46.0)
Hemoglobin: 13.1 g/dL (ref 12.0–15.0)
Immature Granulocytes: 1 %
Lymphocytes Relative: 22 %
Lymphs Abs: 2 10*3/uL (ref 0.7–4.0)
MCH: 28 pg (ref 26.0–34.0)
MCHC: 32 g/dL (ref 30.0–36.0)
MCV: 87.4 fL (ref 80.0–100.0)
Monocytes Absolute: 0.6 10*3/uL (ref 0.1–1.0)
Monocytes Relative: 7 %
Neutro Abs: 6.3 10*3/uL (ref 1.7–7.7)
Neutrophils Relative %: 65 %
Platelets: 313 10*3/uL (ref 150–400)
RBC: 4.68 MIL/uL (ref 3.87–5.11)
RDW: 14.5 % (ref 11.5–15.5)
WBC: 9.4 10*3/uL (ref 4.0–10.5)
nRBC: 0 % (ref 0.0–0.2)

## 2023-03-08 LAB — COMPREHENSIVE METABOLIC PANEL
ALT: 47 U/L — ABNORMAL HIGH (ref 0–44)
AST: 35 U/L (ref 15–41)
Albumin: 3.8 g/dL (ref 3.5–5.0)
Alkaline Phosphatase: 69 U/L (ref 38–126)
Anion gap: 8 (ref 5–15)
BUN: 16 mg/dL (ref 6–20)
CO2: 25 mmol/L (ref 22–32)
Calcium: 9.1 mg/dL (ref 8.9–10.3)
Chloride: 104 mmol/L (ref 98–111)
Creatinine, Ser: 0.77 mg/dL (ref 0.44–1.00)
GFR, Estimated: >60 mL/min (ref >60)
Glucose, Bld: 101 mg/dL — ABNORMAL HIGH (ref 70–99)
Potassium: 4 mmol/L (ref 3.5–5.1)
Sodium: 137 mmol/L (ref 135–145)
Total Bilirubin: 0.5 mg/dL (ref 0.3–1.2)
Total Protein: 7.1 g/dL (ref 6.5–8.1)

## 2023-03-08 LAB — TROPONIN I (HIGH SENSITIVITY)
Troponin I (High Sensitivity): 3 ng/L (ref <18)
Troponin I (High Sensitivity): 3 ng/L (ref <18)

## 2023-03-08 LAB — LIPASE, BLOOD: Lipase: 30 U/L (ref 11–51)

## 2023-03-08 NOTE — ED Triage Notes (Signed)
Pt presents to ER with c/o chest pain that radiates into upper back that started around 4 hours ago.  Pt states she used her ntg at home without relief.  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  Pt denies taking any blood thinners at home.  Pt denies any SOB, dizziness or n/v.  Pt is otherwise A&O x4 and in NAD in triage.

## 2023-03-08 NOTE — ED Provider Notes (Signed)
[facility] Provider Note    Event Date/Time   First MD Initiated Contact with Patient 03/08/23 [phone]     (approximate)   History   Chest Pain   HPI  Claudia Campbell is a 52 y.o. female with a history of scad back and April of this past year presents to the ER for evaluation of midsternal chest pain as well as some right shoulder pain woke her from sleep around 2 AM no associated diaphoresis.  Tried nitro without any improvement.  This does feel somewhat different from her scad symptoms as those were primarily left-sided.  She is status postcholecystectomy.  Denies any abdominal pain no nausea or vomiting.  States that currently her pain is very mild does not want a thing for pain.  States that yesterday was a normal day for her no symptoms or discomfort prior to going to bed.     Physical Exam   Triage Vital Signs: ED Triage Vitals  Encounter Vitals Group     BP 03/08/23 0700 (!) 143/99     Systolic BP Percentile --      Diastolic BP Percentile --      Pulse Rate 03/08/23 0700 75     Resp 03/08/23 0700 20     Temp 03/08/23 0700 98.2 F (36.8 C)     Temp src --      SpO2 03/08/23 0700 90 %     Weight 03/08/23 0700 (!) 444 lb (201.4 kg)     Height 03/08/23 0700 5\' 9"  (1.753 m)     Head Circumference --      Peak Flow --      Pain Score 03/08/23 0659 9     Pain Loc --      Pain Education --      Exclude from Growth Chart --     Most recent vital signs: Vitals:   03/08/23 0700 03/08/23 1022  BP: (!) 143/99 115/78  Pulse: 75 62  Resp: 20 16  Temp: 98.2 F (36.8 C)   SpO2: 90% 100%     Constitutional: Alert  Eyes: Conjunctivae are normal.  Head: Atraumatic. Nose: No congestion/rhinnorhea. Mouth/Throat: Mucous membranes are moist.   Neck: Painless ROM.  Cardiovascular:   Good peripheral circulation. Respiratory: Normal respiratory effort.  No retractions.  Gastrointestinal: Soft and nontender.  Musculoskeletal:  no  deformity Neurologic:  MAE spontaneously. No gross focal neurologic deficits are appreciated.  Skin:  Skin is warm, dry and intact. No rash noted. Psychiatric: Mood and affect are normal. Speech and behavior are normal.    ED Results / Procedures / Treatments   Labs (all labs ordered are listed, but only abnormal results are displayed) Labs Reviewed  COMPREHENSIVE METABOLIC PANEL - Abnormal; Notable for the following components:      Result Value   Glucose, Bld 101 (*)    ALT 47 (*)    All other components within normal limits  CBC WITH DIFFERENTIAL/PLATELET  LIPASE, BLOOD  TROPONIN I (HIGH SENSITIVITY)  TROPONIN I (HIGH SENSITIVITY)     EKG  ED ECG REPORT I, Willy Eddy, the attending physician, personally viewed and interpreted this ECG.   Date: 03/08/2023  EKG Time: 6:58  Rate: 75  Rhythm: sinus  Axis: normal  Intervals: normal  ST&T Change: no stemi, no depressions    RADIOLOGY Please see ED Course for my review and interpretation.  I personally reviewed all radiographic images ordered to evaluate for the above acute complaints and  reviewed radiology reports and findings.  These findings were personally discussed with the patient.  Please see medical record for radiology report.    PROCEDURES:  Critical Care performed:   Procedures   MEDICATIONS ORDERED IN ED: Medications - No data to display   IMPRESSION / MDM / ASSESSMENT AND PLAN / ED COURSE  I reviewed the triage vital signs and the nursing notes.                              Differential diagnosis includes, but is not limited to, ACS, pericarditis, esophagitis, boerhaaves, pe, dissection, pna, bronchitis, costochondritis  Patient presenting to the ER for evaluation of symptoms as described above.  Based on symptoms, risk factors and considered above differential, this presenting complaint could reflect a potentially life-threatening illness therefore the patient will be placed on  continuous pulse oximetry and telemetry for monitoring.  Laboratory evaluation will be sent to evaluate for the above complaints.  Patient is low risk by Wells criteria she is PERC negative.  Her EKG is nonischemic appearing will observe for serial cardiac enzymes.  Abdominal exam is soft and benign.   Clinical Course as of 03/08/23 1136  Thu Mar 08, 2023  0825 Initial troponin is 3. [PR]    Clinical Course User Index [PR] Willy Eddy, MD   Repeat troponin negative.  Does appear stable and appropriate for outpatient follow-up.  FINAL CLINICAL IMPRESSION(S) / ED DIAGNOSES   Final diagnoses:  Atypical chest pain     Rx / DC Orders   ED Discharge Orders     None        Note:  This document was prepared using Dragon voice recognition software and may include unintentional dictation errors.    Willy Eddy, MD 03/08/23 1136

## 2023-03-08 NOTE — Progress Notes (Signed)
Daily Session Note  Patient Details  Name: Claudia Campbell MRN: [id number] Date of Birth: 1971/06/21 Referring Provider:   Flowsheet Row Cardiac Rehab from 01/03/2023 in [facility] Cardiac and Pulmonary Rehab  Referring Provider End, Cristal Deer MD       Encounter Date: 03/08/2023  Check In:  Session Check In - 03/08/23 1718       Check-In   Supervising physician immediately available to respond to emergencies See telemetry face sheet for immediately available ER MD    Location [facility]-Cardiac & Pulmonary Rehab    Staff Present Cora Collum, RN, BSN, CCRP;[place] Gentle Jewel Baize, RN BSN;Joseph Wall, [place]    Virtual Visit No    Medication changes reported     No    Fall or balance concerns reported    No    Warm-up and Cool-down Performed on first and last piece of equipment    Resistance Training Performed Yes    VAD Patient? No    PAD/SET Patient? No      Pain Assessment   Currently in Pain? No/denies                Social History   Tobacco Use  Smoking Status Never  Smokeless Tobacco Never    Goals Met:  Independence with exercise equipment Exercise tolerated well No report of concerns or symptoms today Strength training completed today  Goals Unmet:  Not Applicable  Comments: Pt able to follow exercise prescription today without complaint.  Will continue to monitor for progression.    Dr. Bethann Punches is Medical Director for [facility] Cardiac Rehabilitation.  Dr. Vida Rigger is Medical Director for [facility] Pulmonary Rehabilitation.

## 2023-03-12 ENCOUNTER — Encounter: Payer: BC Managed Care – PPO | Admitting: *Deleted

## 2023-03-12 DIAGNOSIS — I2542 Coronary artery dissection: Secondary | ICD-10-CM

## 2023-03-12 DIAGNOSIS — Z48812 Encounter for surgical aftercare following surgery on the circulatory system: Secondary | ICD-10-CM | POA: Diagnosis not present

## 2023-03-12 DIAGNOSIS — I21A1 Myocardial infarction type 2: Secondary | ICD-10-CM

## 2023-03-12 NOTE — Progress Notes (Signed)
Daily Session Note  Patient Details  Name: Claudia Campbell MRN: [id number] Date of Birth: March 24, 1971 Referring Provider:   Flowsheet Row Cardiac Rehab from 01/03/2023 in [facility] Cardiac and Pulmonary Rehab  Referring Provider End, Cristal Deer MD       Encounter Date: 03/12/2023  Check In:  Session Check In - 03/12/23 1738       Check-In   Supervising physician immediately available to respond to emergencies See telemetry face sheet for immediately available ER MD    Location [facility]-Cardiac & Pulmonary Rehab    Staff Present Susann Givens, RN BSN;Joseph [place], RCP,RRT,BSRT;Laureen [place], [place], RRT, CPFT    Virtual Visit No    Medication changes reported     No    Fall or balance concerns reported    No    Warm-up and Cool-down Performed on first and last piece of equipment    Resistance Training Performed Yes    VAD Patient? No    PAD/SET Patient? No      Pain Assessment   Currently in Pain? No/denies                Social History   Tobacco Use  Smoking Status Never  Smokeless Tobacco Never    Goals Met:  Independence with exercise equipment Exercise tolerated well No report of concerns or symptoms today Strength training completed today  Goals Unmet:  Not Applicable  Comments: Pt able to follow exercise prescription today without complaint.  Will continue to monitor for progression.    Dr. Bethann Punches is Medical Director for [facility] Cardiac Rehabilitation.  Dr. Vida Rigger is Medical Director for [facility] Pulmonary Rehabilitation.

## 2023-03-14 ENCOUNTER — Encounter: Payer: BC Managed Care – PPO | Admitting: *Deleted

## 2023-03-14 DIAGNOSIS — Z48812 Encounter for surgical aftercare following surgery on the circulatory system: Secondary | ICD-10-CM | POA: Diagnosis not present

## 2023-03-14 DIAGNOSIS — I21A1 Myocardial infarction type 2: Secondary | ICD-10-CM

## 2023-03-14 NOTE — Progress Notes (Signed)
Daily Session Note  Patient Details  Name: Claudia Campbell MRN: [id number] Date of Birth: 1970-09-11 Referring Provider:   Flowsheet Row Cardiac Rehab from 01/03/2023 in [facility] Cardiac and Pulmonary Rehab  Referring Provider End, Cristal Deer MD       Encounter Date: 03/14/2023  Check In:  Session Check In - 03/14/23 1714       Check-In   Supervising physician immediately available to respond to emergencies See telemetry face sheet for immediately available ER MD    Location [facility]-Cardiac & Pulmonary Rehab    Staff Present Susann Givens, RN BSN;Joseph [place], RCP,RRT,BSRT;Laureen [place], [place], RRT, CPFT    Virtual Visit No    Medication changes reported     No    Fall or balance concerns reported    No    Warm-up and Cool-down Performed on first and last piece of equipment    Resistance Training Performed Yes    VAD Patient? No    PAD/SET Patient? No      Pain Assessment   Currently in Pain? No/denies                Social History   Tobacco Use  Smoking Status Never  Smokeless Tobacco Never    Goals Met:  Independence with exercise equipment Exercise tolerated well No report of concerns or symptoms today Strength training completed today  Goals Unmet:  Not Applicable  Comments: Pt able to follow exercise prescription today without complaint.  Will continue to monitor for progression.    Dr. Bethann Punches is Medical Director for [facility] Cardiac Rehabilitation.  Dr. Vida Rigger is Medical Director for [facility] Pulmonary Rehabilitation.

## 2023-03-15 ENCOUNTER — Encounter: Payer: BC Managed Care – PPO | Admitting: *Deleted

## 2023-03-15 DIAGNOSIS — Z48812 Encounter for surgical aftercare following surgery on the circulatory system: Secondary | ICD-10-CM | POA: Diagnosis not present

## 2023-03-15 DIAGNOSIS — I21A1 Myocardial infarction type 2: Secondary | ICD-10-CM

## 2023-03-15 DIAGNOSIS — I2542 Coronary artery dissection: Secondary | ICD-10-CM

## 2023-03-15 NOTE — Progress Notes (Signed)
Daily Session Note  Patient Details  Name: Claudia Campbell MRN: [id number] Date of Birth: 1971/06/14 Referring Provider:   Flowsheet Row Cardiac Rehab from 01/03/2023 in [facility] Cardiac and Pulmonary Rehab  Referring Provider End, Cristal Deer MD       Encounter Date: 03/15/2023  Check In:  Session Check In - 03/15/23 1706       Check-In   Supervising physician immediately available to respond to emergencies See telemetry face sheet for immediately available ER MD    Location [facility]-Cardiac & Pulmonary Rehab    Staff Present Elige Ko, RCP,RRT,BSRT;Ric Rosenberg Jewel Baize, RN BSN;Susanne Bice, RN, BSN, CCRP    Virtual Visit No    Medication changes reported     No    Fall or balance concerns reported    No    Warm-up and Cool-down Performed on first and last piece of equipment    Resistance Training Performed Yes    VAD Patient? No    PAD/SET Patient? No      Pain Assessment   Currently in Pain? No/denies                Social History   Tobacco Use  Smoking Status Never  Smokeless Tobacco Never    Goals Met:  Independence with exercise equipment Exercise tolerated well No report of concerns or symptoms today Strength training completed today  Goals Unmet:  Not Applicable  Comments: Pt able to follow exercise prescription today without complaint.  Will continue to monitor for progression.    Dr. Bethann Punches is Medical Director for [facility] Cardiac Rehabilitation.  Dr. Vida Rigger is Medical Director for [facility] Pulmonary Rehabilitation.

## 2023-03-19 ENCOUNTER — Encounter: Payer: BC Managed Care – PPO | Admitting: *Deleted

## 2023-03-19 DIAGNOSIS — I21A1 Myocardial infarction type 2: Secondary | ICD-10-CM

## 2023-03-19 DIAGNOSIS — I2542 Coronary artery dissection: Secondary | ICD-10-CM

## 2023-03-19 DIAGNOSIS — Z48812 Encounter for surgical aftercare following surgery on the circulatory system: Secondary | ICD-10-CM | POA: Diagnosis not present

## 2023-03-19 NOTE — Progress Notes (Signed)
Daily Session Note  Patient Details  Name: Claudia Campbell MRN: [id number] Date of Birth: 1971-01-27 Referring Provider:   Flowsheet Row Cardiac Rehab from 01/03/2023 in [facility] Cardiac and Pulmonary Rehab  Referring Provider End, Cristal Deer MD       Encounter Date: 03/19/2023  Check In:  Session Check In - 03/19/23 1726       Check-In   Supervising physician immediately available to respond to emergencies See telemetry face sheet for immediately available ER MD    Location [facility]-Cardiac & Pulmonary Rehab    Staff Present Susann Givens, RN BSN;Joseph [place], RCP,RRT,BSRT;Laureen [place], [place], RRT, CPFT    Virtual Visit No    Medication changes reported     No    Fall or balance concerns reported    No    Warm-up and Cool-down Performed on first and last piece of equipment    Resistance Training Performed Yes    VAD Patient? No    PAD/SET Patient? No      Pain Assessment   Currently in Pain? No/denies                Social History   Tobacco Use  Smoking Status Never  Smokeless Tobacco Never    Goals Met:  Independence with exercise equipment Exercise tolerated well No report of concerns or symptoms today Strength training completed today  Goals Unmet:  Not Applicable  Comments: Pt able to follow exercise prescription today without complaint.  Will continue to monitor for progression.    Dr. Bethann Punches is Medical Director for [facility] Cardiac Rehabilitation.  Dr. Vida Rigger is Medical Director for [facility] Pulmonary Rehabilitation.

## 2023-03-21 ENCOUNTER — Encounter: Payer: BC Managed Care – PPO | Admitting: *Deleted

## 2023-03-21 DIAGNOSIS — I21A1 Myocardial infarction type 2: Secondary | ICD-10-CM

## 2023-03-21 DIAGNOSIS — I2542 Coronary artery dissection: Secondary | ICD-10-CM

## 2023-03-21 NOTE — Progress Notes (Signed)
Incomplete Session Note  Patient Details  Name: Claudia Campbell MRN: [id number]    Date of Birth: Mar 25, 1971 Referring Provider:   Flowsheet Row Cardiac Rehab from 01/03/2023 in [facility] Cardiac and Pulmonary Rehab  Referring Provider End, Cristal Deer MD       [name] did not complete her rehab session.  Her blood pressure was over 150s/90s even after rest which is greater than her restriction. Information reviewed on what to discuss with her doctor. She will contact them this evening and report back doctor's decision.

## 2023-03-26 ENCOUNTER — Encounter: Payer: BC Managed Care – PPO | Admitting: *Deleted

## 2023-03-26 DIAGNOSIS — Z48812 Encounter for surgical aftercare following surgery on the circulatory system: Secondary | ICD-10-CM | POA: Diagnosis not present

## 2023-03-26 DIAGNOSIS — I21A1 Myocardial infarction type 2: Secondary | ICD-10-CM

## 2023-03-26 DIAGNOSIS — I2542 Coronary artery dissection: Secondary | ICD-10-CM

## 2023-03-26 NOTE — Progress Notes (Signed)
Daily Session Note  Patient Details  Name: Claudia Campbell MRN: [id number] Date of Birth: [birth year] Referring Provider:   Flowsheet Row Cardiac Rehab from 01/03/2023 in [facility] Cardiac and Pulmonary Rehab  Referring Provider End, Cristal Deer MD       Encounter Date: 03/26/2023  Check In:  Session Check In - 03/26/23 1724       Check-In   Supervising physician immediately available to respond to emergencies See telemetry face sheet for immediately available ER MD    Location [facility]-Cardiac & Pulmonary Rehab    Staff Present Susann Givens, RN Mabeline Caras, BS, ACSM CEP, Exercise Physiologist;Joseph Reino [place], [place]    Virtual Visit No    Medication changes reported     No    Fall or balance concerns reported    No    Warm-up and Cool-down Performed on first and last piece of equipment    Resistance Training Performed Yes    VAD Patient? No    PAD/SET Patient? No      Pain Assessment   Currently in Pain? No/denies                Social History   Tobacco Use  Smoking Status Never  Smokeless Tobacco Never    Goals Met:  Independence with exercise equipment Exercise tolerated well No report of concerns or symptoms today Strength training completed today  Goals Unmet:  Not Applicable  Comments: Pt able to follow exercise prescription today without complaint.  Will continue to monitor for progression.    Dr. Bethann Punches is Medical Director for [facility] Cardiac Rehabilitation.  Dr. Vida Rigger is Medical Director for [facility] Pulmonary Rehabilitation.

## 2023-03-28 ENCOUNTER — Encounter: Payer: BC Managed Care – PPO | Admitting: *Deleted

## 2023-03-28 DIAGNOSIS — I2542 Coronary artery dissection: Secondary | ICD-10-CM

## 2023-03-28 DIAGNOSIS — Z48812 Encounter for surgical aftercare following surgery on the circulatory system: Secondary | ICD-10-CM | POA: Diagnosis not present

## 2023-03-28 DIAGNOSIS — I21A1 Myocardial infarction type 2: Secondary | ICD-10-CM

## 2023-03-28 NOTE — Progress Notes (Signed)
Daily Session Note  Patient Details  Name: Niani Harpring MRN: [id number] Date of Birth: 12/13/1970 Referring Provider:   Flowsheet Row Cardiac Rehab from 01/03/2023 in [facility] Cardiac and Pulmonary Rehab  Referring Provider End, Cristal Deer MD       Encounter Date: 03/28/2023  Check In:  Session Check In - 03/28/23 1624       Check-In   Supervising physician immediately available to respond to emergencies See telemetry face sheet for immediately available ER MD    Location [facility]-Cardiac & Pulmonary Rehab    Staff Present Susann Givens, RN BSN;Joseph Reino [place], Guinevere Ferrari, RN, [place]    Virtual Visit No    Medication changes reported     No    Fall or balance concerns reported    No    Warm-up and Cool-down Performed on first and last piece of equipment    Resistance Training Performed Yes    VAD Patient? No    PAD/SET Patient? No      Pain Assessment   Currently in Pain? No/denies                Social History   Tobacco Use  Smoking Status Never  Smokeless Tobacco Never    Goals Met:  Independence with exercise equipment Exercise tolerated well No report of concerns or symptoms today Strength training completed today  Goals Unmet:  Not Applicable  Comments: Pt able to follow exercise prescription today without complaint.  Will continue to monitor for progression.    Dr. Bethann Punches is Medical Director for [facility] Cardiac Rehabilitation.  Dr. Vida Rigger is Medical Director for [facility] Pulmonary Rehabilitation.

## 2023-03-29 ENCOUNTER — Encounter: Payer: BC Managed Care – PPO | Attending: Internal Medicine | Admitting: *Deleted

## 2023-03-29 DIAGNOSIS — Z48812 Encounter for surgical aftercare following surgery on the circulatory system: Secondary | ICD-10-CM | POA: Diagnosis present

## 2023-03-29 DIAGNOSIS — I2542 Coronary artery dissection: Secondary | ICD-10-CM

## 2023-03-29 DIAGNOSIS — I21A1 Myocardial infarction type 2: Secondary | ICD-10-CM

## 2023-03-29 DIAGNOSIS — I252 Old myocardial infarction: Secondary | ICD-10-CM | POA: Diagnosis not present

## 2023-03-29 NOTE — Progress Notes (Signed)
Daily Session Note  Patient Details  Name: Claudia Campbell MRN: [id number] Date of Birth: Mar 11, 1971 Referring Provider:   Flowsheet Row Cardiac Rehab from 01/03/2023 in [facility] Cardiac and Pulmonary Rehab  Referring Provider End, Cristal Deer MD       Encounter Date: 03/29/2023  Check In:  Session Check In - 03/29/23 1706       Check-In   Supervising physician immediately available to respond to emergencies See telemetry face sheet for immediately available ER MD    Location [facility]-Cardiac & Pulmonary Rehab    Staff Present Elige Ko, RCP,RRT,BSRT;Diontre Harps Jewel Baize, RN BSN;Other   Maggie Best ad Jetta Lout   Virtual Visit No    Medication changes reported     No    Fall or balance concerns reported    No    Warm-up and Cool-down Performed on first and last piece of equipment    Resistance Training Performed Yes    VAD Patient? No      Pain Assessment   Currently in Pain? No/denies                Social History   Tobacco Use  Smoking Status Never  Smokeless Tobacco Never    Goals Met:  Independence with exercise equipment Exercise tolerated well No report of concerns or symptoms today Strength training completed today  Goals Unmet:  Not Applicable  Comments: Pt able to follow exercise prescription today without complaint.  Will continue to monitor for progression.    Dr. Bethann Punches is Medical Director for [facility] Cardiac Rehabilitation.  Dr. Vida Rigger is Medical Director for [facility] Pulmonary Rehabilitation.

## 2023-03-30 ENCOUNTER — Ambulatory Visit: Payer: BC Managed Care – PPO | Attending: Cardiology

## 2023-03-30 DIAGNOSIS — H34212 Partial retinal artery occlusion, left eye: Secondary | ICD-10-CM | POA: Diagnosis not present

## 2023-03-30 NOTE — Progress Notes (Signed)
Normal study without evidence of blood clots, dissection, plaque, or stenosis.

## 2023-04-02 ENCOUNTER — Encounter: Payer: BC Managed Care – PPO | Admitting: *Deleted

## 2023-04-02 VITALS — Ht 69.76 in | Wt >= 6400 oz

## 2023-04-02 DIAGNOSIS — I2542 Coronary artery dissection: Secondary | ICD-10-CM

## 2023-04-02 DIAGNOSIS — I21A1 Myocardial infarction type 2: Secondary | ICD-10-CM

## 2023-04-02 DIAGNOSIS — Z48812 Encounter for surgical aftercare following surgery on the circulatory system: Secondary | ICD-10-CM | POA: Diagnosis not present

## 2023-04-02 NOTE — Progress Notes (Signed)
Daily Session Note  Patient Details  Name: Claudia Campbell MRN: [id number] Date of Birth: Nov 09, 1970 Referring Provider:   Flowsheet Row Cardiac Rehab from 01/03/2023 in [facility] Cardiac and Pulmonary Rehab  Referring Provider End, Cristal Deer MD       Encounter Date: 04/02/2023  Check In:  Session Check In - 04/02/23 1737       Check-In   Supervising physician immediately available to respond to emergencies See telemetry face sheet for immediately available ER MD    Location [facility]-Cardiac & Pulmonary Rehab    Staff Present Elige Ko, Algernon Huxley, BS, ACSM CEP, Exercise Physiologist;Izic Stfort Jewel Baize, RN BSN    Virtual Visit No    Medication changes reported     No    Fall or balance concerns reported    No    Warm-up and Cool-down Performed on first and last piece of equipment    Resistance Training Performed Yes    VAD Patient? No    PAD/SET Patient? No      Pain Assessment   Currently in Pain? No/denies                Social History   Tobacco Use  Smoking Status Never  Smokeless Tobacco Never    Goals Met:  Independence with exercise equipment Exercise tolerated well No report of concerns or symptoms today Strength training completed today  Goals Unmet:  Not Applicable  Comments: Pt able to follow exercise prescription today without complaint.  Will continue to monitor for progression.    6 Minute Walk     Row Name 01/03/23 1703 04/02/23 1723       6 Minute Walk   Phase Initial Discharge    Distance 585 feet 1000 feet    Distance % Change -- 71 %    Distance Feet Change -- 415 ft    Walk Time 4.06 minutes 6 minutes    # of Rest Breaks 1  2:54-4:48 0    MPH 1.63 1.89    METS 1.03 1.48    RPE 11 15    Perceived Dyspnea  1 1    VO2 Peak 3.62 5.2    Symptoms Yes (comment) Yes (comment)    Comments left sided hip pain 2/10, SOB knee pain    Resting HR 81 bpm 85 bpm    Resting BP 124/78 122/80    Resting Oxygen Saturation  97 % 95 %     Exercise Oxygen Saturation  during 6 min walk 96 % 97 %    Max Ex. HR 121 bpm 98 bpm    Max Ex. BP 162/84 162/84    2 Minute Post BP 130/80 120/80               Dr. Bethann Punches is Medical Director for [facility] Cardiac Rehabilitation.  Dr. Vida Rigger is Medical Director for [facility] Pulmonary Rehabilitation.

## 2023-04-02 NOTE — Patient Instructions (Signed)
Discharge Patient Instructions  Patient Details  Name: Claudia Campbell MRN: [id number] Date of Birth: [birth year] Referring Provider:  Jerrilyn Cairo Primary Ca*   Number of Visits: 36  Reason for Discharge:  Patient reached a stable level of exercise. Patient independent in their exercise. Patient has met program and personal goals. Diagnosis:  Type 2 MI (myocardial infarction) [facility])  Coronary artery dissection  Discharge Exercise Prescription (Final Exercise Prescription Changes):  Exercise Prescription Changes - 03/21/23 0800       Response to Exercise   Blood Pressure (Admit) 118/74    Blood Pressure (Exercise) 160/80    Blood Pressure (Exit) 126/76    Heart Rate (Admit) 81 bpm    Heart Rate (Exercise) 108 bpm    Heart Rate (Exit) 90 bpm    Oxygen Saturation (Admit) 93 %    Oxygen Saturation (Exercise) 96 %    Oxygen Saturation (Exit) 95 %    Rating of Perceived Exertion (Exercise) 12    Duration Continue with 30 min of aerobic exercise without signs/symptoms of physical distress.    Intensity THRR unchanged      Progression   Progression Continue to progress workloads to maintain intensity without signs/symptoms of physical distress.    Average METs 2.04      Resistance Training   Training Prescription Yes    Weight 3lb    Reps 10-15      Interval Training   Interval Training No      Oxygen   Oxygen Continuous      T5 Nustep   Level 4    Minutes 30    METs 2.2      Home Exercise Plan   Plans to continue exercise at [employer] (comment)   re-join [employer], and seated bike at home   Frequency Add 2 additional days to program exercise sessions.    Initial Home Exercises Provided 01/31/23      Oxygen   Maintain Oxygen Saturation 88% or higher             Functional Capacity:  6 Minute Walk     Row Name 01/03/23 1703 04/02/23 1723       6 Minute Walk   Phase Initial Discharge    Distance 585 feet 1000 feet    Distance % Change --  71 %    Distance Feet Change -- 415 ft    Walk Time 4.06 minutes 6 minutes    # of Rest Breaks 1  2:54-4:48 0    MPH 1.63 1.89    METS 1.03 1.48    RPE 11 15    Perceived Dyspnea  1 1    VO2 Peak 3.62 5.2    Symptoms Yes (comment) Yes (comment)    Comments left sided hip pain 2/10, SOB knee pain    Resting HR 81 bpm 85 bpm    Resting BP 124/78 122/80    Resting Oxygen Saturation  97 % 95 %    Exercise Oxygen Saturation  during 6 min walk 96 % 97 %    Max Ex. HR 121 bpm 98 bpm    Max Ex. BP 162/84 162/84    2 Minute Post BP 130/80 120/80             Nutrition & Weight - Outcomes:  Pre Biometrics - 04/02/23 1726       Pre Biometrics   Height 5' 9.76" (1.772 m)    Weight 442 lb (200.5 kg)  BMI (Calculated) 63.85    Single Leg Stand 1 seconds             Goals reviewed with patient; copy given to patient.

## 2023-04-04 ENCOUNTER — Encounter: Payer: Self-pay | Admitting: *Deleted

## 2023-04-04 ENCOUNTER — Encounter: Payer: BC Managed Care – PPO | Admitting: *Deleted

## 2023-04-04 DIAGNOSIS — Z48812 Encounter for surgical aftercare following surgery on the circulatory system: Secondary | ICD-10-CM | POA: Diagnosis not present

## 2023-04-04 DIAGNOSIS — I2542 Coronary artery dissection: Secondary | ICD-10-CM

## 2023-04-04 DIAGNOSIS — I21A1 Myocardial infarction type 2: Secondary | ICD-10-CM

## 2023-04-04 NOTE — Progress Notes (Signed)
Daily Session Note  Patient Details  Name: Claudia Campbell MRN: [id number] Date of Birth: [birth year] Referring Provider:   Flowsheet Row Cardiac Rehab from 01/03/2023 in [facility]iac [facility]  Referring Provider End, Cristal Deer MD       Encounter Date: 04/04/2023  Check In:  Session Check In - 04/04/23 1640       Check-In   Supervising physician immediately available to respond to emergencies See telemetry face sheet for immediately available ER MD    Location [facility]-Cardiac & Pulmonary Rehab    Staff Present Susann Givens, RN BSN;Megan Katrinka Blazing, RN, Bobetta Lime, RCP,RRT,BSRT    Virtual Visit No    Medication changes reported     No    Fall or balance concerns reported    No    Warm-up and Cool-down Performed on first and last piece of equipment    Resistance Training Performed Yes    VAD Patient? No    PAD/SET Patient? No      Pain Assessment   Currently in Pain? No/denies                Social History   Tobacco Use  Smoking Status Never  Smokeless Tobacco Never    Goals Met:  Independence with exercise equipment Exercise tolerated well No report of concerns or symptoms today Strength training completed today  Goals Unmet:  Not Applicable  Comments: Pt able to follow exercise prescription today without complaint.  Will continue to monitor for progression.    Dr. Bethann Punches is Medical Director for [facility] Cardiac Rehabilitation.  Dr. Vida Rigger is Medical Director for [facility] Pulmonary Rehabilitation.

## 2023-04-04 NOTE — Progress Notes (Signed)
Cardiac Individual Treatment Plan  Patient Details  Name: Mckenlee Klass MRN: [id number] Date of Birth: 01-14-1971 Referring Provider:   Flowsheet Row Cardiac Rehab from 01/03/2023 in [facility] Cardiac and Pulmonary Rehab  Referring Provider End, Cristal Deer MD       Initial Encounter Date:  Flowsheet Row Cardiac Rehab from 01/03/2023 in [facility] Cardiac and Pulmonary Rehab  Date 01/03/23       Visit Diagnosis: Type 2 MI (myocardial infarction) (HCC)  Patient's Home Medications on Admission:  Current Outpatient Medications:    albuterol (VENTOLIN HFA) 108 (90 Base) MCG/ACT inhaler, Inhale 2 puffs into the lungs every 6 (six) hours as needed for wheezing or shortness of breath., Disp: , Rfl:    aspirin EC 81 MG tablet, Take 1 tablet (81 mg total) by mouth daily. Swallow whole., Disp: 30 tablet, Rfl: 12   atorvastatin (LIPITOR) 40 MG tablet, Take 1 tablet (40 mg total) by mouth daily., Disp: 90 tablet, Rfl: 1   augmented betamethasone dipropionate (DIPROLENE-AF) 0.05 % cream, APPLY THIN FILM TO HANDS AND FEET SITES TWICE DAILY UNTIL CLEAR, Disp: , Rfl:    betamethasone dipropionate 0.05 % cream, Apply 1 Application topically daily., Disp: , Rfl:    Cholecalciferol 250 MCG (10000 UT) CAPS, Take 1,000 Units by mouth daily., Disp: , Rfl:    Ciclopirox 1 % shampoo, Apply 1 Application topically 3 (three) times a week., Disp: , Rfl:    esomeprazole (NEXIUM) 40 MG capsule, Take 40 mg by mouth 2 (two) times daily before a meal. , Disp: , Rfl:    fluocinonide (LIDEX) 0.05 % external solution, Apply 1 Application topically 2 (two) times daily., Disp: , Rfl:    fluticasone (FLONASE) 50 MCG/ACT nasal spray, Place 1 spray into both nostrils daily., Disp: , Rfl:    levothyroxine (SYNTHROID) 100 MCG tablet, Take by mouth., Disp: , Rfl:    losartan (COZAAR) 25 MG tablet, Take 1 tablet (25 mg total) by mouth daily., Disp: 90 tablet, Rfl: 3   meclizine (ANTIVERT) 25 MG tablet, Take 1 tablet (25 mg total) by mouth  3 (three) times daily as needed for dizziness., Disp: 20 tablet, Rfl: 0   metoprolol succinate (TOPROL-XL) 50 MG 24 hr tablet, Take 1 tablet (50 mg total) by mouth every evening., Disp: 90 tablet, Rfl: 3   montelukast (SINGULAIR) 10 MG tablet, Take 1 tablet by mouth at bedtime., Disp: , Rfl:    nitroGLYCERIN (NITROSTAT) 0.4 MG SL tablet, Place 1 tablet (0.4 mg total) under the tongue every 5 (five) minutes as needed for chest pain., Disp: 30 tablet, Rfl: 12   sertraline (ZOLOFT) 100 MG tablet, Take 150 mg by mouth daily. , Disp: , Rfl:    tacrolimus (PROTOPIC) 0.1 % ointment, Apply 1 Application topically 2 (two) times daily., Disp: , Rfl:   Past Medical History: Past Medical History:  Diagnosis Date   Abnormal liver function test    Anxiety    Arthritis    Asthma    Barrett esophagus    Depression    First degree AV block    GERD (gastroesophageal reflux disease)    Hypothyroidism    Post ablation   Kidney stones    Migraines    PONV (postoperative nausea and vomiting)    Restless leg    Sleep apnea    NO cpap at this time, broken   Tenosynovitis    Thyroid nodule    Vitamin D deficiency     Tobacco Use: Social History  Tobacco Use  Smoking Status Never  Smokeless Tobacco Never    Labs: Review Flowsheet       Latest Ref Rng & Units 12/14/2022  Labs for ITP Cardiac and Pulmonary Rehab  Cholestrol 0 - 200 mg/dL 657   LDL (calc) 0 - 99 mg/dL 76   HDL-C >84 mg/dL 43   Trlycerides <696 mg/dL 295     Details             Exercise Target Goals: Exercise Program Goal: Individual exercise prescription set using results from initial 6 min walk test and THRR while considering  patient's activity barriers and safety.   Exercise Prescription Goal: Initial exercise prescription builds to 30-45 minutes a day of aerobic activity, 2-3 days per week.  Home exercise guidelines will be given to patient during program as part of exercise prescription that the participant  will acknowledge.   Education: Aerobic Exercise: - Group verbal and visual presentation on the components of exercise prescription. Introduces F.I.T.T principle from ACSM for exercise prescriptions.  Reviews F.I.T.T. principles of aerobic exercise including progression. Written material given at graduation. Flowsheet Row Cardiac Rehab from 03/28/2023 in [facility] Cardiac and Pulmonary Rehab  Education need identified 01/03/23  Date 01/31/23  Educator [facility]  Instruction Review Code 1- [employer] Understanding       Education: Resistance Exercise: - Group verbal and visual presentation on the components of exercise prescription. Introduces F.I.T.T principle from ACSM for exercise prescriptions  Reviews F.I.T.T. principles of resistance exercise including progression. Written material given at graduation.    Education: Exercise & Equipment Safety: - Individual verbal instruction and demonstration of equipment use and safety with use of the equipment. Flowsheet Row Cardiac Rehab from 03/28/2023 in [facility] Cardiac and Pulmonary Rehab  Date 12/27/22  Educator [facility]  Instruction Review Code 1- Verbalizes Understanding       Education: Exercise Physiology & General Exercise Guidelines: - Group verbal and written instruction with models to review the exercise physiology of the cardiovascular system and associated critical values. Provides general exercise guidelines with specific guidelines to those with heart or lung disease.    Education: Flexibility, Balance, Mind/Body Relaxation: - Group verbal and visual presentation with interactive activity on the components of exercise prescription. Introduces F.I.T.T principle from ACSM for exercise prescriptions. Reviews F.I.T.T. principles of flexibility and balance exercise training including progression. Also discusses the mind body connection.  Reviews various relaxation techniques to help reduce and manage stress (i.e. Deep breathing, progressive muscle  relaxation, and visualization). Balance handout provided to take home. Written material given at graduation.   Activity Barriers & Risk Stratification:  Activity Barriers & Cardiac Risk Stratification - 01/03/23 1700       Activity Barriers & Cardiac Risk Stratification   Activity Barriers Deconditioning;Muscular Weakness;Shortness of Breath;Joint Problems;Other (comment);Arthritis;Balance Concerns    Comments no cartilidge in bilateral knees, tendonitis both wrist and elbows, hip pain, hx left rotator cuff repair    Cardiac Risk Stratification Moderate             6 Minute Walk:  6 Minute Walk     Row Name 01/03/23 1703 04/02/23 1723       6 Minute Walk   Phase Initial Discharge    Distance 585 feet 1000 feet    Distance % Change -- 71 %    Distance Feet Change -- 415 ft    Walk Time 4.06 minutes 6 minutes    # of Rest Breaks 1  2:54-4:48 0  MPH 1.63 1.89    METS 1.03 1.48    RPE 11 15    Perceived Dyspnea  1 1    VO2 Peak 3.62 5.2    Symptoms Yes (comment) Yes (comment)    Comments left sided hip pain 2/10, SOB knee pain    Resting HR 81 bpm 85 bpm    Resting BP 124/78 122/80    Resting Oxygen Saturation  97 % 95 %    Exercise Oxygen Saturation  during 6 min walk 96 % 97 %    Max Ex. HR 121 bpm 98 bpm    Max Ex. BP 162/84 162/84    2 Minute Post BP 130/80 120/80             Oxygen Initial Assessment:   Oxygen Re-Evaluation:   Oxygen Discharge (Final Oxygen Re-Evaluation):   Initial Exercise Prescription:  Initial Exercise Prescription - 01/03/23 1700       Date of Initial Exercise RX and Referring Provider   Date 01/03/23    Referring Provider End, Cristal Deer MD      Oxygen   Maintain Oxygen Saturation 88% or higher      T5 Nustep   Level 1    SPM 80    Minutes 15    METs 1      Biostep-RELP   Level 1    SPM 50    Minutes 15    METs 1      Track   Laps 9    Minutes 15    METs 1.49      Prescription Details   Frequency  (times per week) 3    Duration Progress to 30 minutes of continuous aerobic without signs/symptoms of physical distress      Intensity   THRR 40-80% of Max Heartrate 117 - 153    Ratings of Perceived Exertion 11-13    Perceived Dyspnea 0-4      Progression   Progression Continue to progress workloads to maintain intensity without signs/symptoms of physical distress.      Resistance Training   Training Prescription Yes    Weight 3 lb    Reps 10-15             Perform Capillary Blood Glucose checks as needed.  Exercise Prescription Changes:   Exercise Prescription Changes     Row Name 01/03/23 1700 01/09/23 1400 01/23/23 0800 01/31/23 1700 02/05/23 1600     Response to Exercise   Blood Pressure (Admit) 124/78 126/76 132/84 -- 112/78   Blood Pressure (Exercise) 162/84 160/84 184/100 -- 158/90   Blood Pressure (Exit) 130/80 124/86 126/78 -- 136/80   Heart Rate (Admit) 81 bpm 92 bpm 93 bpm -- 98 bpm   Heart Rate (Exercise) 121 bpm 114 bpm 119 bpm -- 105 bpm   Heart Rate (Exit) 85 bpm 91 bpm 103 bpm -- 82 bpm   Oxygen Saturation (Admit) 97 % -- -- -- --   Oxygen Saturation (Exercise) 96 % -- -- -- --   Oxygen Saturation (Exit) 97 % -- -- -- --   Rating of Perceived Exertion (Exercise) 11 13 14  -- 12   Perceived Dyspnea (Exercise) 1 -- -- -- --   Symptoms left sided hip pain 2/10, SOB hip pain -- -- none   Comments walk test results 2nd full day of exercise -- -- --   Duration -- Progress to 30 minutes of  aerobic without signs/symptoms of physical distress Progress to 30 minutes of  aerobic without signs/symptoms of physical distress Progress to 30 minutes of  aerobic without signs/symptoms of physical distress Continue with 30 min of aerobic exercise without signs/symptoms of physical distress.   Intensity -- THRR unchanged THRR unchanged THRR unchanged THRR unchanged     Progression   Progression -- Continue to progress workloads to maintain intensity without  signs/symptoms of physical distress. Continue to progress workloads to maintain intensity without signs/symptoms of physical distress. Continue to progress workloads to maintain intensity without signs/symptoms of physical distress. Continue to progress workloads to maintain intensity without signs/symptoms of physical distress.   Average METs -- 1.9 1.9 1.9 2     Resistance Training   Training Prescription -- Yes Yes Yes Yes   Weight -- 3 lb 3 lb 3 lb 3 lb   Reps -- 10-15 10-15 10-15 10-15     Interval Training   Interval Training -- No No No No     T5 Nustep   Level -- 2 2 2 2    Minutes -- 15 15 15 30    METs -- 1.8 2 2 2      Biostep-RELP   Level -- 1 -- -- --   Minutes -- 15 -- -- --   METs -- 2 -- -- --     Track   Laps -- -- 4 4 --   Minutes -- -- 15 15 --   METs -- -- 1.22 1.22 --     Home Exercise Plan   Plans to continue exercise at -- -- -- [employer] (comment)  re-join [employer], and seated bike at home [employer] (comment)  re-join [employer], and seated bike at home   Frequency -- -- -- Add 2 additional days to program exercise sessions. Add 2 additional days to program exercise sessions.   Initial Home Exercises Provided -- -- -- 01/31/23 01/31/23     Oxygen   Maintain Oxygen Saturation -- 88% or higher 88% or higher 88% or higher 88% or higher    Row Name 02/21/23 1500 03/07/23 1700 03/21/23 0800         Response to Exercise   Blood Pressure (Admit) 128/76 132/82 118/74     Blood Pressure (Exercise) 162/98 146/82 160/80     Blood Pressure (Exit) 130/80 134/82 126/76     Heart Rate (Admit) 87 bpm 84 bpm 81 bpm     Heart Rate (Exercise) 117 bpm 92 bpm 108 bpm     Heart Rate (Exit) 98 bpm 87 bpm 90 bpm     Oxygen Saturation (Admit) -- 96 % 93 %     Oxygen Saturation (Exercise) -- 96 % 96 %     Oxygen Saturation (Exit) -- 95 % 95 %     Rating of Perceived Exertion (Exercise) 13 12 12      Duration Continue with 30 min of aerobic  exercise without signs/symptoms of physical distress. Continue with 30 min of aerobic exercise without signs/symptoms of physical distress. Continue with 30 min of aerobic exercise without signs/symptoms of physical distress.     Intensity THRR unchanged THRR unchanged THRR unchanged       Progression   Progression Continue to progress workloads to maintain intensity without signs/symptoms of physical distress. Continue to progress workloads to maintain intensity without signs/symptoms of physical distress. Continue to progress workloads to maintain intensity without signs/symptoms of physical distress.     Average METs 1.9 1.96 2.04       Resistance Training   Training  Prescription Yes Yes Yes     Weight 3 lb 3 lb 3lb     Reps 10-15 10-15 10-15       Interval Training   Interval Training No No No       Oxygen   Oxygen -- -- Continuous       T5 Nustep   Level 4 4 4      Minutes 30 15 30      METs 1.9 2 2.2       Home Exercise Plan   Plans to continue exercise at [employer] (comment)  re-join [employer], and seated bike at home [employer] (comment)  re-join [employer], and seated bike at home [employer] (comment)  re-join [employer], and seated bike at home     Frequency Add 2 additional days to program exercise sessions. Add 2 additional days to program exercise sessions. Add 2 additional days to program exercise sessions.     Initial Home Exercises Provided 01/31/23 01/31/23 01/31/23       Oxygen   Maintain Oxygen Saturation 88% or higher 88% or higher 88% or higher              Exercise Comments:   Exercise Comments     Row Name 01/04/23 1714           Exercise Comments First full day of exercise!  Patient was oriented to gym and equipment including functions, settings, policies, and procedures.  Patient's individual exercise prescription and treatment plan were reviewed.  All starting workloads were established based on the results of  the 6 minute walk test done at initial orientation visit.  The plan for exercise progression was also introduced and progression will be customized based on patient's performance and goals.                Exercise Goals and Review:   Exercise Goals     Row Name 01/03/23 1713             Exercise Goals   Increase Physical Activity Yes       Intervention Provide advice, education, support and counseling about physical activity/exercise needs.;Develop an individualized exercise prescription for aerobic and resistive training based on initial evaluation findings, risk stratification, comorbidities and participant's personal goals.       Expected Outcomes Short Term: Attend rehab on a regular basis to increase amount of physical activity.;Long Term: Add in home exercise to make exercise part of routine and to increase amount of physical activity.;Long Term: Exercising regularly at least 3-5 days a week.       Increase Strength and Stamina Yes       Intervention Provide advice, education, support and counseling about physical activity/exercise needs.;Develop an individualized exercise prescription for aerobic and resistive training based on initial evaluation findings, risk stratification, comorbidities and participant's personal goals.       Expected Outcomes Short Term: Increase workloads from initial exercise prescription for resistance, speed, and METs.;Short Term: Perform resistance training exercises routinely during rehab and add in resistance training at home;Long Term: Improve cardiorespiratory fitness, muscular endurance and strength as measured by increased METs and functional capacity ( )       Able to understand and use rate of perceived exertion (RPE) scale Yes       Intervention Provide education and explanation on how to use RPE scale       Expected Outcomes Short Term: Able to use RPE daily in rehab to express subjective intensity level;Long  Term:  Able to use RPE to guide  intensity level when exercising independently       Able to understand and use Dyspnea scale Yes       Intervention Provide education and explanation on how to use Dyspnea scale       Expected Outcomes Short Term: Able to use Dyspnea scale daily in rehab to express subjective sense of shortness of breath during exertion;Long Term: Able to use Dyspnea scale to guide intensity level when exercising independently       Knowledge and understanding of Target Heart Rate Range (THRR) Yes       Intervention Provide education and explanation of THRR including how the numbers were predicted and where they are located for reference       Expected Outcomes Short Term: Able to state/look up THRR;Long Term: Able to use THRR to govern intensity when exercising independently;Short Term: Able to use daily as guideline for intensity in rehab       Able to check pulse independently Yes       Intervention Provide education and demonstration on how to check pulse in carotid and radial arteries.;Review the importance of being able to check your own pulse for safety during independent exercise       Expected Outcomes Long Term: Able to check pulse independently and accurately;Short Term: Able to explain why pulse checking is important during independent exercise       Understanding of Exercise Prescription Yes       Intervention Provide education, explanation, and written materials on patient's individual exercise prescription       Expected Outcomes Short Term: Able to explain program exercise prescription;Long Term: Able to explain home exercise prescription to exercise independently                Exercise Goals Re-Evaluation :  Exercise Goals Re-Evaluation     Row Name 01/04/23 1715 01/09/23 1502 01/23/23 0854 01/31/23 1756 02/05/23 1605     Exercise Goal Re-Evaluation   Exercise Goals Review Increase Physical Activity;Able to understand and use rate of perceived exertion (RPE) scale;Knowledge and  understanding of Target Heart Rate Range (THRR);Understanding of Exercise Prescription;Increase Strength and Stamina;Able to check pulse independently Increase Physical Activity;Increase Strength and Stamina;Understanding of Exercise Prescription Increase Physical Activity;Increase Strength and Stamina;Understanding of Exercise Prescription Increase Physical Activity;Increase Strength and Stamina;Understanding of Exercise Prescription;Able to understand and use rate of perceived exertion (RPE) scale;Knowledge and understanding of Target Heart Rate Range (THRR);Able to check pulse independently Increase Physical Activity;Increase Strength and Stamina;Understanding of Exercise Prescription   Comments Reviewed RPE scale, THR and program prescription with pt today.  Pt voiced understanding and was given a copy of goals to take home. Debbi completed 2 sessions od rehab so far. she is quite limited due to some knee and hip pain that she experiences often. We tried out the Biostep and it gave her more hip pain. She did well on the T5Nustep. Her cardiologist provided her with BP restrictions for exercise and will continue to work with Dr. Okey Dupre to figure out where her guidelines will be as it appeared that her BP went beyond her restriction, however, we are then limiting the patient with her exercise as she feels she can do more as she felt good. We are awaiting to hear back from Dr. Okey Dupre on next steps. We will continue to monitor as she progresses in the program. Debbi is doing well in the program. She has improved to level 2 on the  T5 nustep. Her cardiologist provided her with BP restrictions for exercise which has limited her progress. She states that she feels she can do more as she has exceeded her BP restriction but was stopped to lower her BP. She began walking the track and walked 4 laps before being stopped due to her BP going beyond her restriction. We will continue to monitor her progress in the program. Reviewed  home exercise with pt today.  Pt plans to rejoin [employer] and use stationary bike at home for exercise.  Reviewed THR, pulse, RPE, sign and symptoms, pulse oximetery and when to call 911 or MD.  Also discussed weather considerations and indoor options.  Pt voiced understanding. Debbi is doing well in rehab. She is has been doing 30 min on the T5 NuStep.  We will continue to encourage her to try to walk and monitor her progress.   Expected Outcomes Short: Use RPE daily to regulate intensity.  Long: Follow program prescription in THR. Short: Continue initial exercise prescription, monitor BP closely and hear back from Dr.End Long: Build up overall strength and stamina Short: Continue to progress workloads without exceeding BP restrictions. Long: Continue to improve strength and stamina. Short: add1-2 days a week of at home exercise on off days of rehab. Long: become independent with exercise routine. Short: Continue to try to walk more Long: Continue to improve stamina    Row Name 02/21/23 1523 03/07/23 1705 03/21/23 0835 03/28/23 1741 03/28/23 1756     Exercise Goal Re-Evaluation   Exercise Goals Review Increase Physical Activity;Increase Strength and Stamina;Understanding of Exercise Prescription Increase Physical Activity;Increase Strength and Stamina;Understanding of Exercise Prescription Increase Physical Activity;Understanding of Exercise Prescription Increase Physical Activity;Understanding of Exercise Prescription --   Comments Debbie attended 2 sessions this past 2 weeks.  She continues with the T5Nustep at level 5. She is using 3 Lb hand weights.Marland Kitchen WIll ask about home exercise activity next review and continue to monitor her progress. Debbie attended 2 sessions this past 2 weeks.  She continues with the T5Nustep at level 5. She is using 3 Lb hand weights.Marland Kitchen WIll ask about home exercise activity next review and continue to monitor her progress. Debbie attended 5 sessions for this review. She  maintians the T5Nustep at level 4. RPE are 12.  Will work towards her increasing a level on the Nustep.  Will continue to encourage and monitor exercise  progression. Eunice Blase has not added exercise at home because she has a strict BP restrition of 150/90 or less and feels more comfortable exercising in rehab where we can monitor her blood pressure more efficiently during exercise. She does have a BP cuff at home and plans to continue monitoring BP at home upon graduation. she was encouraged that when she graduates and starts exercising at home to stop in the middle of her exercise to check BP with her automated cuff to make sure she is within range. She plans to exercise 3 days a week once she graduates. --   Expected Outcomes STG Attend all scheduled sessions. Encourage to try other equipment. LTG Conitue with exercise progression STG Attend all scheduled sessions. Encourage to try other equipment. LTG Continue with exercise progression ION:[id number] workloads as tolerated with goal of increased stamina and strength.   LTG: Continued exercise progression as tolerated during program and after discharge. Short: graduate from Cardiac rehab. Long: continue to exercise independently upon graduation and monitor BP --            Discharge  Exercise Prescription (Final Exercise Prescription Changes):  Exercise Prescription Changes - 03/21/23 0800       Response to Exercise   Blood Pressure (Admit) 118/74    Blood Pressure (Exercise) 160/80    Blood Pressure (Exit) 126/76    Heart Rate (Admit) 81 bpm    Heart Rate (Exercise) 108 bpm    Heart Rate (Exit) 90 bpm    Oxygen Saturation (Admit) 93 %    Oxygen Saturation (Exercise) 96 %    Oxygen Saturation (Exit) 95 %    Rating of Perceived Exertion (Exercise) 12    Duration Continue with 30 min of aerobic exercise without signs/symptoms of physical distress.    Intensity THRR unchanged      Progression   Progression Continue to progress workloads to  maintain intensity without signs/symptoms of physical distress.    Average METs 2.04      Resistance Training   Training Prescription Yes    Weight 3lb    Reps 10-15      Interval Training   Interval Training No      Oxygen   Oxygen Continuous      T5 Nustep   Level 4    Minutes 30    METs 2.2      Home Exercise Plan   Plans to continue exercise at [employer] (comment)   re-join [employer], and seated bike at home   Frequency Add 2 additional days to program exercise sessions.    Initial Home Exercises Provided 01/31/23      Oxygen   Maintain Oxygen Saturation 88% or higher             Nutrition:  Target Goals: Understanding of nutrition guidelines, daily intake of sodium 1500mg , cholesterol 200mg , calories 30% from fat and 7% or less from saturated fats, daily to have 5 or more servings of fruits and vegetables.  Education: All About Nutrition: -Group instruction provided by verbal, written material, interactive activities, discussions, models, and posters to present general guidelines for heart healthy nutrition including fat, fiber, MyPlate, the role of sodium in heart healthy nutrition, utilization of the nutrition label, and utilization of this knowledge for meal planning. Follow up email sent as well. Written material given at graduation. Flowsheet Row Cardiac Rehab from 03/28/2023 in [facility] Cardiac and Pulmonary Rehab  Education need identified 01/03/23       Biometrics:  Pre Biometrics - 04/02/23 1726       Pre Biometrics   Height 5' 9.76" (1.772 m)    Weight 442 lb (200.5 kg)    BMI (Calculated) 63.85    Single Leg Stand 1 seconds              Nutrition Therapy Plan and Nutrition Goals:  Nutrition Therapy & Goals - 03/06/23 1100       Nutrition Therapy   Diet Cardiac, Low Na    Protein (specify units) 90    Fiber 25 grams    Whole Grain Foods 3 servings    Saturated Fats 15 max. grams    Fruits and Vegetables 5 servings/day     Sodium 2 grams      Personal Nutrition Goals   Nutrition Goal watch sodium, less than 2000mg  per day    Personal Goal #2 Include veggies at meals    Comments Patient has been doing keto for several years now. She's aware of the strict requirements needed to stay in ketosis. Told her than if she is not in ketosis  she should do a low carb diet allowing for more flexibility and balanced plates (~[id number] as a example). She reports she lost ~50lbs following the diet and says it works for her. Educated on keto diet, potential pit falls, how protein and fat intake is higher than other diet. Explained the types of fat, sources, and how to read food labels. Encouraged her to choose heart healthy fats over poor fats like saturated or trans fat, especially given keto's high fat distribution. Reviewed 24hr food recall, and emphasized the need to choose leaner protein, include more healthy fats, veggies, and fiber. Sodium is also a concern with some of the foods she eats. Talked with her about ways to reduce intake and cut out salting at table. 24-hours Recall:  B: eggs, bacon or sausages, coffee  L: pork chop (salt and pepper), green beans, shallots  D: hot dog no bun      Intervention Plan   Intervention Prescribe, educate and counsel regarding individualized specific dietary modifications aiming towards targeted core components such as weight, hypertension, lipid management, diabetes, heart failure and other comorbidities.;Nutrition handout(s) given to patient.    Expected Outcomes Short Term Goal: Understand basic principles of dietary content, such as calories, fat, sodium, cholesterol and nutrients.;Short Term Goal: A plan has been developed with personal nutrition goals set during dietitian appointment.;Long Term Goal: Adherence to prescribed nutrition plan.             Nutrition Assessments:  MEDIFICTS Score Key: ?70 Need to make dietary changes  40-70 Heart Healthy Diet ? 40 Therapeutic Level  Cholesterol Diet  Flowsheet Row Cardiac Rehab from 01/03/2023 in [facility] Cardiac and Pulmonary Rehab  Picture Your Plate Total Score on Admission 63      Picture Your Plate Scores: <45 Unhealthy dietary pattern with much room for improvement. 41-50 Dietary pattern unlikely to meet recommendations for good health and room for improvement. 51-60 More healthful dietary pattern, with some room for improvement.  >60 Healthy dietary pattern, although there may be some specific behaviors that could be improved.    Nutrition Goals Re-Evaluation:  Nutrition Goals Re-Evaluation     Row Name 01/31/23 1752 02/28/23 1730           Goals   Current Weight 446 lb (202.3 kg) 445 lb (201.9 kg)      Nutrition Goal Meet with RD Meet with RD      Comment Debbie wnats to continue with a keto diet but sometimes finds herself eating the wrong foods. Debbie wants to meet the RD      Expected Outcome Short: meet with the RD. Long: adhere a diet that adheres to her. Short: meet with the RD. Long: adhere a diet that adheres to her.               Nutrition Goals Discharge (Final Nutrition Goals Re-Evaluation):  Nutrition Goals Re-Evaluation - 02/28/23 1730       Goals   Current Weight 445 lb (201.9 kg)    Nutrition Goal Meet with RD    Comment Debbie wants to meet the RD    Expected Outcome Short: meet with the RD. Long: adhere a diet that adheres to her.             Psychosocial: Target Goals: Acknowledge presence or absence of significant depression and/or stress, maximize coping skills, provide positive support system. Participant is able to verbalize types and ability to use techniques and skills needed for reducing stress and depression.  Education: Stress, Anxiety, and Depression - Group verbal and visual presentation to define topics covered.  Reviews how body is impacted by stress, anxiety, and depression.  Also discusses healthy ways to reduce stress and to treat/manage anxiety and  depression.  Written material given at graduation. Flowsheet Row Cardiac Rehab from 03/28/2023 in [facility] Cardiac and Pulmonary Rehab  Date 03/28/23  Educator SB  Instruction Review Code 1- [employer] Understanding       Education: Sleep Hygiene -Provides group verbal and written instruction about how sleep can affect your health.  Define sleep hygiene, discuss sleep cycles and impact of sleep habits. Review good sleep hygiene tips.    Initial Review & Psychosocial Screening:  Initial Psych Review & Screening - 12/27/22 1427       Initial Review   Current issues with Current Psychotropic Meds;Current Depression;Current Anxiety/Panic;Current Stress Concerns    Source of Stress Concerns Family      Family Dynamics   Good Support System? Yes   lives with sister, parents live nearby too, Son     Barriers   Psychosocial barriers to participate in program The patient should benefit from training in stress management and relaxation.;Psychosocial barriers identified (see note)      Screening Interventions   Interventions Encouraged to exercise;Provide feedback about the scores to participant;To provide support and resources with identified psychosocial needs    Expected Outcomes Short Term goal: Utilizing psychosocial counselor, staff and physician to assist with identification of specific Stressors or current issues interfering with healing process. Setting desired goal for each stressor or current issue identified.;Long Term Goal: Stressors or current issues are controlled or eliminated.;Short Term goal: Identification and review with participant of any Quality of Life or Depression concerns found by scoring the questionnaire.;Long Term goal: The participant improves quality of Life and PHQ9 Scores as seen by post scores and/or verbalization of changes             Quality of Life Scores:   Quality of Life - 01/03/23 1651       Quality of Life   Select Quality of Life      Quality of  Life Scores   Health/Function Pre 17.2 %    Socioeconomic Pre 23.63 %    Psych/Spiritual Pre 17.79 %    Family Pre 19.2 %    GLOBAL Pre 19.07 %            Scores of 19 and below usually indicate a poorer quality of life in these areas.  A difference of  2-3 points is a clinically meaningful difference.  A difference of 2-3 points in the total score of the Quality of Life Index has been associated with significant improvement in overall quality of life, self-image, physical symptoms, and general health in studies assessing change in quality of life.  PHQ-9: Review Flowsheet       01/03/2023  Depression screen PHQ 2/9  Decreased Interest 0  Down, Depressed, Hopeless 1  PHQ - 2 Score 1  Altered sleeping 0  Tired, decreased energy 2  Change in appetite 0  Feeling bad or failure about yourself  0  Trouble concentrating 0  Moving slowly or fidgety/restless 0  Suicidal thoughts 0  PHQ-9 Score 3  Difficult doing work/chores Somewhat difficult    Details           Interpretation of Total Score  Total Score Depression Severity:  1-4 = Minimal depression, 5-9 = Mild depression, 10-14 = Moderate depression, 15-19 =  Moderately severe depression, 20-27 = Severe depression   Psychosocial Evaluation and Intervention:  Psychosocial Evaluation - 01/03/23 1658       Psychosocial Evaluation & Interventions   Comments Patient states she feels well mentally at this time compared to how she used to feel about 10 years ago when she was in a "darker" place. 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 She is excited to start the program and denies any other SI concerns at this time. Encouraged patient to notify if anything changes or starts to feel worse.             Psychosocial Re-Evaluation:  Psychosocial Re-Evaluation     Row Name 01/31/23 1757 02/28/23 1736            Psychosocial Re-Evaluation   Current issues with Current Psychotropic Meds;Current Sleep Concerns;History of Depression Current Psychotropic Meds;Current Sleep Concerns;History of Depression      Comments Her son is dating someone who is the same age as her but has broke up with her. The ex girlfriend of her son is trying to make waves and get into her family buisness. It has been a stressor on her since her son was dating her. She is getting up alot in the night the use the bathroom and is interupting her sleep. Her sister is going to change to a different school system for work. She may have to move again due to her sisters work. Its creating some stress since she did not want to move again,      Expected Outcomes Short: Attend [facility] stress management education to decrease stress. Long: Maintain exercise Post HeartTrack to keep stress at a minimum. Short: Attend [facility] stress management education to decrease stress. Long: Maintain exercise Post [facility] to keep stress at a minimum.      Interventions Encouraged to attend Cardiac Rehabilitation for the exercise Encouraged to attend Cardiac Rehabilitation for the exercise      Continue Psychosocial Services  Follow up required by staff Follow up required by staff               Psychosocial Discharge (Final Psychosocial Re-Evaluation):  Psychosocial Re-Evaluation - 02/28/23 1736       Psychosocial Re-Evaluation   Current issues with Current Psychotropic Meds;Current Sleep Concerns;History of Depression    Comments Her sister is going to change to a different school system for work. She may have to move again due to her sisters work. Its creating some stress since she did not want to move again,    Expected Outcomes Short: Attend [facility] stress management education to decrease stress. Long: Maintain exercise Post HeartTrack to keep stress at a minimum.    Interventions Encouraged to attend Cardiac Rehabilitation for the exercise     Continue Psychosocial Services  Follow up required by staff             Vocational Rehabilitation: Provide vocational rehab assistance to qualifying candidates.   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  Participant will state understanding/return demonstration of topics presented as noted by education test scores.  Learning Barriers/Preferences:  Learning Barriers/Preferences - 12/27/22 1425       Learning Barriers/Preferences   Learning Barriers Sight   glasses   Learning Preferences Group Instruction;Individual Instruction;Skilled Demonstration;Verbal Instruction;Written Material             General Cardiac Education Topics:  AED/CPR: - Group verbal and written instruction with the use of models to demonstrate the basic use of the AED with the basic ABC's of resuscitation.   Anatomy and Cardiac Procedures: - Group verbal and visual presentation and models provide information about basic cardiac anatomy and function. Reviews the testing methods done to diagnose heart disease and the outcomes of the test results. Describes the treatment choices: Medical Management, Angioplasty, or Coronary Bypass Surgery for treating various heart conditions including Myocardial Infarction, Angina, Valve Disease, and Cardiac Arrhythmias.  Written material given at graduation. Flowsheet Row Cardiac Rehab from 03/28/2023 in [facility] Cardiac and Pulmonary Rehab  Education need identified 01/03/23  Date 02/28/23  Educator MS  Instruction Review Code 1- Verbalizes Understanding       Medication Safety: - Group verbal and  visual instruction to review commonly prescribed medications for heart and lung disease. Reviews the medication, class of the drug, and side effects. Includes the steps to properly store meds and maintain the prescription regimen.  Written material given at graduation. Flowsheet Row Cardiac Rehab from 03/28/2023 in [facility] Cardiac and Pulmonary Rehab  Date 03/07/23  Educator MS  Instruction Review Code 1- Verbalizes Understanding       Intimacy: - Group verbal instruction through game format to discuss how heart and lung disease can affect sexual intimacy. Written material given at graduation.. Flowsheet Row Cardiac Rehab from 03/28/2023 in [facility] Cardiac and Pulmonary Rehab  Education need identified 01/03/23  Date 01/31/23  Educator [facility]  Instruction Review Code 1- Verbalizes Understanding       Know Your Numbers and Heart Failure: - Group verbal and visual instruction to discuss disease risk factors for cardiac and pulmonary disease and treatment options.  Reviews associated critical values for Overweight/Obesity, Hypertension, Cholesterol, and Diabetes.  Discusses basics of heart failure: signs/symptoms and treatments.  Introduces Heart Failure Zone chart for action plan for heart failure.  Written material given at graduation. Flowsheet Row Cardiac Rehab from 03/28/2023 in [facility] Cardiac and Pulmonary Rehab  Date 03/14/23  Educator MS  Instruction Review Code 1- Verbalizes Understanding       Infection Prevention: - Provides verbal and written material to individual with discussion of infection control including proper hand washing and proper equipment cleaning during exercise session. Flowsheet Row Cardiac Rehab from 03/28/2023 in [facility] Cardiac and Pulmonary Rehab  Date 12/27/22  Educator [facility]  Instruction Review Code 1- Verbalizes Understanding       Falls Prevention: - Provides verbal and written material to individual with discussion of falls prevention and safety. Flowsheet Row  Cardiac Rehab from 03/28/2023 in [facility] Cardiac and Pulmonary Rehab  Date 12/27/22  Educator [facility]  Instruction Review Code 1- Verbalizes Understanding       Other: -Provides group and verbal instruction on various topics (see comments) Flowsheet Row Cardiac Rehab from 03/28/2023 in [facility] Cardiac and Pulmonary Rehab  Date 02/21/23  Educator MS- [employer]  Instruction Review Code 1- Verbalizes Understanding       Knowledge Questionnaire Score:  Knowledge Questionnaire Score - 01/03/23 1651       Knowledge Questionnaire Score   Pre  Score 23/26             Core Components/Risk Factors/Patient Goals at Admission:  Personal Goals and Risk Factors at Admission - 01/03/23 1714       Core Components/Risk Factors/Patient Goals on Admission    Weight Management Yes;Obesity;Weight Loss    Intervention Weight Management: Develop a combined nutrition and exercise program designed to reach desired caloric intake, while maintaining appropriate intake of nutrient and fiber, sodium and fats, and appropriate energy expenditure required for the weight goal.;Weight Management: Provide education and appropriate resources to help participant work on and attain dietary goals.;Weight Management/Obesity: Establish reasonable short term and long term weight goals.;Obesity: Provide education and appropriate resources to help participant work on and attain dietary goals.    Admit Weight 442 lb (200.5 kg)    Goal Weight: Short Term 436 lb (197.8 kg)    Goal Weight: Long Term 200 lb (90.7 kg)   per patient goal   Expected Outcomes Short Term: Continue to assess and modify interventions until short term weight is achieved;Long Term: Adherence to nutrition and physical activity/exercise program aimed toward attainment of established weight goal;Weight Loss: Understanding of general recommendations for a balanced deficit meal plan, which promotes 1-2 lb weight loss per week and includes a negative energy balance of  [phone] kcal/d;Understanding recommendations for meals to include 15-35% energy as protein, 25-35% energy from fat, 35-60% energy from carbohydrates, less than 200mg  of dietary cholesterol, 20-35 gm of total fiber daily;Understanding of distribution of calorie intake throughout the day with the consumption of 4-5 meals/snacks    Hypertension Yes    Intervention Provide education on lifestyle modifcations including regular physical activity/exercise, weight management, moderate sodium restriction and increased consumption of fresh fruit, vegetables, and low fat dairy, alcohol moderation, and smoking cessation.;Monitor prescription use compliance.    Expected Outcomes Short Term: Continued assessment and intervention until BP is < 140/42mm HG in hypertensive participants. < 130/46mm HG in hypertensive participants with diabetes, heart failure or chronic kidney disease.;Long Term: Maintenance of blood pressure at goal levels.    Lipids Yes    Intervention Provide education and support for participant on nutrition & aerobic/resistive exercise along with prescribed medications to achieve LDL 70mg , HDL >40mg .    Expected Outcomes Short Term: Participant states understanding of desired cholesterol values and is compliant with medications prescribed. Participant is following exercise prescription and nutrition guidelines.;Long Term: Cholesterol controlled with medications as prescribed, with individualized exercise RX and with personalized nutrition plan. Value goals: LDL < 70mg , HDL > 40 mg.             Education:Diabetes - Individual verbal and written instruction to review signs/symptoms of diabetes, desired ranges of glucose level fasting, after meals and with exercise. Acknowledge that pre and post exercise glucose checks will be done for 3 sessions at entry of program.   Core Components/Risk Factors/Patient Goals Review:   Goals and Risk Factor Review     Row Name 01/31/23 1756 02/28/23 1739            Core Components/Risk Factors/Patient Goals Review   Personal Goals Review Weight Management/Obesity Hypertension      Review Debbi would like to work on weight loss. She is working with her sister to come up with a diet plan. She also wants to meet with the RD to go over her diet. Debbi has been up and down with her blood pressures, She is supposed to be 150/90 and below. She has trouble keeping her  blood pressure down when she wants to move faster and do more. She does not like that she cannot work hard to get healthier and has to slow down. Her doctor will not reasses blood pressure ranges aftter Cardiac rehab.      Expected Outcomes Short: lose 5 poiunds in the next few weeks. Long: Reach weight goal. Short: talk to doctor about blood pressure. Long: maintain adequate blood pressure readings.               Core Components/Risk Factors/Patient Goals at Discharge (Final Review):   Goals and Risk Factor Review - 02/28/23 1739       Core Components/Risk Factors/Patient Goals Review   Personal Goals Review Hypertension    Review Debbi has been up and down with her blood pressures, She is supposed to be 150/90 and below. She has trouble keeping her blood pressure down when she wants to move faster and do more. She does not like that she cannot work hard to get healthier and has to slow down. Her doctor will not reasses blood pressure ranges aftter Cardiac rehab.    Expected Outcomes Short: talk to doctor about blood pressure. Long: maintain adequate blood pressure readings.             ITP Comments:  ITP Comments     Row Name 12/27/22 1441 01/03/23 1649 01/04/23 1714 01/10/23 0800 03/06/23 1357   ITP Comments Completed virtual orientation today.  EP evaluation is scheduled for Wed 5/8 at 230.  Documentation for diagnosis can be found in [facility] encounter 12/12/22. Completed and gym orientation. Initial ITP created and sent for review to Dr. Bethann Punches, Medical Director.  Provided handouts on stress management, breathing techniques and progressive muscular relaxation. Reviewed education on stress management as that is patient's biggest concern at this time- talked about importance on control. First full day of exercise!  Patient was oriented to gym and equipment including functions, settings, policies, and procedures.  Patient's individual exercise prescription and treatment plan were reviewed.  All starting workloads were established based on the results of the 6 minute walk test done at initial orientation visit.  The plan for exercise progression was also introduced and progression will be customized based on patient's performance and goals. 30 Day review completed. Medical Director ITP review done, changes made as directed, and signed approval by Medical Director.   new to program 30 Day review completed. Medical Director ITP review done, changes made as directed, and signed approval by Medical Director.    Row Name 04/04/23 1119           ITP Comments 30 Day review completed. Medical Director ITP review done, changes made as directed, and signed approval by Medical Director.                Comments:

## 2023-04-05 ENCOUNTER — Encounter: Payer: BC Managed Care – PPO | Admitting: *Deleted

## 2023-04-05 DIAGNOSIS — Z48812 Encounter for surgical aftercare following surgery on the circulatory system: Secondary | ICD-10-CM | POA: Diagnosis not present

## 2023-04-05 DIAGNOSIS — I21A1 Myocardial infarction type 2: Secondary | ICD-10-CM

## 2023-04-05 NOTE — Progress Notes (Signed)
Daily Session Note  Patient Details  Name: Claudia Campbell MRN: [id number] Date of Birth: 1971/07/01 Referring Provider:   Flowsheet Row Cardiac Rehab from 01/03/2023 in [facility] Cardiac and Pulmonary Rehab  Referring Provider End, Cristal Deer MD       Encounter Date: 04/05/2023  Check In:  Session Check In - 04/05/23 1739       Check-In   Supervising physician immediately available to respond to emergencies See telemetry face sheet for immediately available ER MD    Location [facility]-Cardiac & Pulmonary Rehab    Staff Present Cora Collum, RN, BSN, CCRP;Joseph High Rolls, RCP,RRT,BSRT;Other   Rory Percy MS,Exercise Physiologist; Serita Sheller BS, Exercise Physiologist   Virtual Visit No    Medication changes reported     No    Fall or balance concerns reported    No    Warm-up and Cool-down Performed on first and last piece of equipment    Resistance Training Performed Yes    VAD Patient? No    PAD/SET Patient? No      Pain Assessment   Currently in Pain? No/denies                Social History   Tobacco Use  Smoking Status Never  Smokeless Tobacco Never    Goals Met:  Independence with exercise equipment Exercise tolerated well No report of concerns or symptoms today  Goals Unmet:  Not Applicable  Comments: Pt able to follow exercise prescription today without complaint.  Will continue to monitor for progression.    Dr. Bethann Punches is Medical Director for [facility] Cardiac Rehabilitation.  Dr. Vida Rigger is Medical Director for [facility] Pulmonary Rehabilitation.

## 2023-04-08 ENCOUNTER — Encounter: Payer: Self-pay | Admitting: Internal Medicine

## 2023-04-09 ENCOUNTER — Encounter: Payer: BC Managed Care – PPO | Admitting: *Deleted

## 2023-04-09 DIAGNOSIS — I2542 Coronary artery dissection: Secondary | ICD-10-CM

## 2023-04-09 DIAGNOSIS — I21A1 Myocardial infarction type 2: Secondary | ICD-10-CM

## 2023-04-09 DIAGNOSIS — Z48812 Encounter for surgical aftercare following surgery on the circulatory system: Secondary | ICD-10-CM | POA: Diagnosis not present

## 2023-04-09 MED ORDER — ATORVASTATIN CALCIUM 40 MG PO TABS: 40.0000 mg | ORAL_TABLET | Freq: Every day | ORAL | 0 refills | Status: DC

## 2023-04-09 NOTE — Progress Notes (Signed)
Daily Session Note  Patient Details  Name: Claudia Campbell MRN: [id number] Date of Birth: Apr 11, 1971 Referring Provider:   Flowsheet Row Cardiac Rehab from 01/03/2023 in [facility]  Referring Provider End, Cristal Deer MD       Encounter Date: 04/09/2023  Check In:  Session Check In - 04/09/23 1728       Check-In   Supervising physician immediately available to respond to emergencies See telemetry face sheet for immediately available ER MD    Location [facility]    Staff Present Susann Givens, RN Mabeline Caras, BS, ACSM CEP, Exercise Physiologist;Joseph Reino [place], [place]    Virtual Visit No    Medication changes reported     No    Fall or balance concerns reported    No    Warm-up and Cool-down Performed on first and last piece of equipment    Resistance Training Performed Yes    VAD Patient? No    PAD/SET Patient? No      Pain Assessment   Currently in Pain? No/denies                Social History   Tobacco Use  Smoking Status Never  Smokeless Tobacco Never    Goals Met:  Independence with exercise equipment Exercise tolerated well No report of concerns or symptoms today Strength training completed today  Goals Unmet:  Not Applicable  Comments: Pt able to follow exercise prescription today without complaint.  Will continue to monitor for progression.    Dr. Bethann Punches is Medical Director for [facility].  Dr. Vida Rigger is Medical Director for [facility].

## 2023-04-11 ENCOUNTER — Encounter: Payer: BC Managed Care – PPO | Admitting: *Deleted

## 2023-04-11 DIAGNOSIS — I21A1 Myocardial infarction type 2: Secondary | ICD-10-CM

## 2023-04-11 DIAGNOSIS — Z48812 Encounter for surgical aftercare following surgery on the circulatory system: Secondary | ICD-10-CM | POA: Diagnosis not present

## 2023-04-11 DIAGNOSIS — I2542 Coronary artery dissection: Secondary | ICD-10-CM

## 2023-04-11 NOTE — Progress Notes (Signed)
Cardiac Individual Treatment Plan  Patient Details  Name: Claudia Campbell MRN: [id number] Date of Birth: [birth year] Referring Provider:   Flowsheet Row Cardiac Rehab from 01/03/2023 in [facility] Cardiac and Pulmonary Rehab  Referring Provider End, Cristal Deer MD       Initial Encounter Date:  Flowsheet Row Cardiac Rehab from 01/03/2023 in [facility] Cardiac and Pulmonary Rehab  Date 01/03/23       Visit Diagnosis: Type 2 MI (myocardial infarction) [facility])  Coronary artery dissection  Patient's Home Medications on Admission:  Current Outpatient Medications:    albuterol (VENTOLIN HFA) 108 (90 Base) MCG/ACT inhaler, Inhale 2 puffs into the lungs every 6 (six) hours as needed for wheezing or shortness of breath., Disp: , Rfl:    aspirin EC 81 MG tablet, Take 1 tablet (81 mg total) by mouth daily. Swallow whole., Disp: 30 tablet, Rfl: 12   atorvastatin (LIPITOR) 40 MG tablet, Take 1 tablet (40 mg total) by mouth daily., Disp: 30 tablet, Rfl: 0   augmented betamethasone dipropionate (DIPROLENE-AF) 0.05 % cream, APPLY THIN FILM TO HANDS AND FEET SITES TWICE DAILY UNTIL CLEAR, Disp: , Rfl:    betamethasone dipropionate 0.05 % cream, Apply 1 Application topically daily., Disp: , Rfl:    Cholecalciferol 250 MCG (10000 UT) CAPS, Take 1,000 Units by mouth daily., Disp: , Rfl:    Ciclopirox 1 % shampoo, Apply 1 Application topically 3 (three) times a week., Disp: , Rfl:    esomeprazole (NEXIUM) 40 MG capsule, Take 40 mg by mouth 2 (two) times daily before a meal. , Disp: , Rfl:    fluocinonide (LIDEX) 0.05 % external solution, Apply 1 Application topically 2 (two) times daily., Disp: , Rfl:    fluticasone (FLONASE) 50 MCG/ACT nasal spray, Place 1 spray into both nostrils daily., Disp: , Rfl:    levothyroxine (SYNTHROID) 100 MCG tablet, Take by mouth., Disp: , Rfl:    losartan (COZAAR) 25 MG tablet, Take 1 tablet (25 mg total) by mouth daily., Disp: 90 tablet, Rfl: 3   meclizine (ANTIVERT) 25 MG tablet, Take 1  tablet (25 mg total) by mouth 3 (three) times daily as needed for dizziness., Disp: 20 tablet, Rfl: 0   metoprolol succinate (TOPROL-XL) 50 MG 24 hr tablet, Take 1 tablet (50 mg total) by mouth every evening., Disp: 90 tablet, Rfl: 3   montelukast (SINGULAIR) 10 MG tablet, Take 1 tablet by mouth at bedtime., Disp: , Rfl:    nitroGLYCERIN (NITROSTAT) 0.4 MG SL tablet, Place 1 tablet (0.4 mg total) under the tongue every 5 (five) minutes as needed for chest pain., Disp: 30 tablet, Rfl: 12   sertraline (ZOLOFT) 100 MG tablet, Take 150 mg by mouth daily. , Disp: , Rfl:    tacrolimus (PROTOPIC) 0.1 % ointment, Apply 1 Application topically 2 (two) times daily., Disp: , Rfl:   Past Medical History: Past Medical History:  Diagnosis Date   Abnormal liver function test    Anxiety    Arthritis    Asthma    Barrett esophagus    Depression    First degree AV block    GERD (gastroesophageal reflux disease)    Hypothyroidism    Post ablation   Kidney stones    Migraines    PONV (postoperative nausea and vomiting)    Restless leg    Sleep apnea    NO cpap at this time, broken   Tenosynovitis    Thyroid nodule    Vitamin D deficiency     Tobacco Use:  Social History   Tobacco Use  Smoking Status Never  Smokeless Tobacco Never    Labs: Review Flowsheet       Latest Ref Rng & Units 12/14/2022  Labs for ITP Cardiac and Pulmonary Rehab  Cholestrol 0 - 200 mg/dL 161   LDL (calc) 0 - 99 mg/dL 76   HDL-C >09 mg/dL 43   Trlycerides <604 mg/dL 540     Details             Exercise Target Goals: Exercise Program Goal: Individual exercise prescription set using results from initial 6 min walk test and THRR while considering  patient's activity barriers and safety.   Exercise Prescription Goal: Initial exercise prescription builds to 30-45 minutes a day of aerobic activity, 2-3 days per week.  Home exercise guidelines will be given to patient during program as part of exercise  prescription that the participant will acknowledge.   Education: Aerobic Exercise: - Group verbal and visual presentation on the components of exercise prescription. Introduces F.I.T.T principle from ACSM for exercise prescriptions.  Reviews F.I.T.T. principles of aerobic exercise including progression. Written material given at graduation. Flowsheet Row Cardiac Rehab from 04/04/2023 in [facility] Cardiac and Pulmonary Rehab  Education need identified 01/03/23  Date 01/31/23  Educator [facility]  Instruction Review Code 1- [employer] Understanding       Education: Resistance Exercise: - Group verbal and visual presentation on the components of exercise prescription. Introduces F.I.T.T principle from ACSM for exercise prescriptions  Reviews F.I.T.T. principles of resistance exercise including progression. Written material given at graduation.    Education: Exercise & Equipment Safety: - Individual verbal instruction and demonstration of equipment use and safety with use of the equipment. Flowsheet Row Cardiac Rehab from 04/04/2023 in [facility] Cardiac and Pulmonary Rehab  Date 12/27/22  Educator [facility]  Instruction Review Code 1- Verbalizes Understanding       Education: Exercise Physiology & General Exercise Guidelines: - Group verbal and written instruction with models to review the exercise physiology of the cardiovascular system and associated critical values. Provides general exercise guidelines with specific guidelines to those with heart or lung disease.  Flowsheet Row Cardiac Rehab from 04/04/2023 in [facility] Cardiac and Pulmonary Rehab  Date 04/04/23  Educator [facility]  Instruction Review Code 1- [employer] Understanding       Education: Flexibility, Balance, Mind/Body Relaxation: - Group verbal and visual presentation with interactive activity on the components of exercise prescription. Introduces F.I.T.T principle from ACSM for exercise prescriptions. Reviews F.I.T.T. principles of flexibility and balance  exercise training including progression. Also discusses the mind body connection.  Reviews various relaxation techniques to help reduce and manage stress (i.e. Deep breathing, progressive muscle relaxation, and visualization). Balance handout provided to take home. Written material given at graduation.   Activity Barriers & Risk Stratification:  Activity Barriers & Cardiac Risk Stratification - 01/03/23 1700       Activity Barriers & Cardiac Risk Stratification   Activity Barriers Deconditioning;Muscular Weakness;Shortness of Breath;Joint Problems;Other (comment);Arthritis;Balance Concerns    Comments no cartilidge in bilateral knees, tendonitis both wrist and elbows, hip pain, hx left rotator cuff repair    Cardiac Risk Stratification Moderate             6 Minute Walk:  6 Minute Walk     Row Name 01/03/23 1703 04/02/23 1723       6 Minute Walk   Phase Initial Discharge    Distance 585 feet 1000 feet    Distance % Change -- 71 %  Distance Feet Change -- 415 ft    Walk Time 4.06 minutes 6 minutes    # of Rest Breaks 1  2:54-4:48 0    MPH 1.63 1.89    METS 1.03 1.48    RPE 11 15    Perceived Dyspnea  1 1    VO2 Peak 3.62 5.2    Symptoms Yes (comment) Yes (comment)    Comments left sided hip pain 2/10, SOB knee pain    Resting HR 81 bpm 85 bpm    Resting BP 124/78 122/80    Resting Oxygen Saturation  97 % 95 %    Exercise Oxygen Saturation  during 6 min walk 96 % 97 %    Max Ex. HR 121 bpm 98 bpm    Max Ex. BP 162/84 162/84    2 Minute Post BP 130/80 120/80             Oxygen Initial Assessment:   Oxygen Re-Evaluation:   Oxygen Discharge (Final Oxygen Re-Evaluation):   Initial Exercise Prescription:  Initial Exercise Prescription - 01/03/23 1700       Date of Initial Exercise RX and Referring Provider   Date 01/03/23    Referring Provider End, Cristal Deer MD      Oxygen   Maintain Oxygen Saturation 88% or higher      T5 Nustep   Level 1    SPM  80    Minutes 15    METs 1      Biostep-RELP   Level 1    SPM 50    Minutes 15    METs 1      Track   Laps 9    Minutes 15    METs 1.49      Prescription Details   Frequency (times per week) 3    Duration Progress to 30 minutes of continuous aerobic without signs/symptoms of physical distress      Intensity   THRR 40-80% of Max Heartrate 117 - 153    Ratings of Perceived Exertion 11-13    Perceived Dyspnea 0-4      Progression   Progression Continue to progress workloads to maintain intensity without signs/symptoms of physical distress.      Resistance Training   Training Prescription Yes    Weight 3 lb    Reps 10-15             Perform Capillary Blood Glucose checks as needed.  Exercise Prescription Changes:   Exercise Prescription Changes     Row Name 01/03/23 1700 01/09/23 1400 01/23/23 0800 01/31/23 1700 02/05/23 1600     Response to Exercise   Blood Pressure (Admit) 124/78 126/76 132/84 -- 112/78   Blood Pressure (Exercise) 162/84 160/84 184/100 -- 158/90   Blood Pressure (Exit) 130/80 124/86 126/78 -- 136/80   Heart Rate (Admit) 81 bpm 92 bpm 93 bpm -- 98 bpm   Heart Rate (Exercise) 121 bpm 114 bpm 119 bpm -- 105 bpm   Heart Rate (Exit) 85 bpm 91 bpm 103 bpm -- 82 bpm   Oxygen Saturation (Admit) 97 % -- -- -- --   Oxygen Saturation (Exercise) 96 % -- -- -- --   Oxygen Saturation (Exit) 97 % -- -- -- --   Rating of Perceived Exertion (Exercise) 11 13 14  -- 12   Perceived Dyspnea (Exercise) 1 -- -- -- --   Symptoms left sided hip pain 2/10, SOB hip pain -- -- none   Comments walk test results  2nd full day of exercise -- -- --   Duration -- Progress to 30 minutes of  aerobic without signs/symptoms of physical distress Progress to 30 minutes of  aerobic without signs/symptoms of physical distress Progress to 30 minutes of  aerobic without signs/symptoms of physical distress Continue with 30 min of aerobic exercise without signs/symptoms of physical  distress.   Intensity -- THRR unchanged THRR unchanged THRR unchanged THRR unchanged     Progression   Progression -- Continue to progress workloads to maintain intensity without signs/symptoms of physical distress. Continue to progress workloads to maintain intensity without signs/symptoms of physical distress. Continue to progress workloads to maintain intensity without signs/symptoms of physical distress. Continue to progress workloads to maintain intensity without signs/symptoms of physical distress.   Average METs -- 1.9 1.9 1.9 2     Resistance Training   Training Prescription -- Yes Yes Yes Yes   Weight -- 3 lb 3 lb 3 lb 3 lb   Reps -- 10-15 10-15 10-15 10-15     Interval Training   Interval Training -- No No No No     T5 Nustep   Level -- 2 2 2 2    Minutes -- 15 15 15 30    METs -- 1.8 2 2 2      Biostep-RELP   Level -- 1 -- -- --   Minutes -- 15 -- -- --   METs -- 2 -- -- --     Track   Laps -- -- 4 4 --   Minutes -- -- 15 15 --   METs -- -- 1.22 1.22 --     Home Exercise Plan   Plans to continue exercise at -- -- -- [employer] (comment)  re-join [employer], and seated bike at home [employer] (comment)  re-join [employer], and seated bike at home   Frequency -- -- -- Add 2 additional days to program exercise sessions. Add 2 additional days to program exercise sessions.   Initial Home Exercises Provided -- -- -- 01/31/23 01/31/23     Oxygen   Maintain Oxygen Saturation -- 88% or higher 88% or higher 88% or higher 88% or higher    Row Name 02/21/23 1500 03/07/23 1700 03/21/23 0800 04/04/23 1300       Response to Exercise   Blood Pressure (Admit) 128/76 132/82 118/74 132/52    Blood Pressure (Exercise) 162/98 146/82 160/80 174/86    Blood Pressure (Exit) 130/80 134/82 126/76 130/88    Heart Rate (Admit) 87 bpm 84 bpm 81 bpm 96 bpm    Heart Rate (Exercise) 117 bpm 92 bpm 108 bpm 101 bpm    Heart Rate (Exit) 98 bpm 87 bpm 90 bpm 89 bpm     Oxygen Saturation (Admit) -- 96 % 93 % 97 %    Oxygen Saturation (Exercise) -- 96 % 96 % 92 %    Oxygen Saturation (Exit) -- 95 % 95 % 98 %    Rating of Perceived Exertion (Exercise) 13 12 12 11     Duration Continue with 30 min of aerobic exercise without signs/symptoms of physical distress. Continue with 30 min of aerobic exercise without signs/symptoms of physical distress. Continue with 30 min of aerobic exercise without signs/symptoms of physical distress. Continue with 30 min of aerobic exercise without signs/symptoms of physical distress.    Intensity THRR unchanged THRR unchanged THRR unchanged THRR unchanged      Progression   Progression Continue to progress workloads to maintain intensity without  signs/symptoms of physical distress. Continue to progress workloads to maintain intensity without signs/symptoms of physical distress. Continue to progress workloads to maintain intensity without signs/symptoms of physical distress. Continue to progress workloads to maintain intensity without signs/symptoms of physical distress.    Average METs 1.9 1.96 2.04 1.6      Resistance Training   Training Prescription Yes Yes Yes Yes    Weight 3 lb 3 lb 3lb 4lb    Reps 10-15 10-15 10-15 10-15      Interval Training   Interval Training No No No No      Oxygen   Oxygen -- -- Continuous --      T5 Nustep   Level 4 4 4 4     Minutes 30 15 30 30     METs 1.9 2 2.2 2      Home Exercise Plan   Plans to continue exercise at [employer] (comment)  re-join [employer], and seated bike at home [employer] (comment)  re-join [employer], and seated bike at home [employer] (comment)  re-join [employer], and seated bike at home [employer] (comment)  re-join [employer], and seated bike at home    Frequency Add 2 additional days to program exercise sessions. Add 2 additional days to program exercise sessions. Add 2 additional days to program exercise sessions. Add 2  additional days to program exercise sessions.    Initial Home Exercises Provided 01/31/23 01/31/23 01/31/23 01/31/23      Oxygen   Maintain Oxygen Saturation 88% or higher 88% or higher 88% or higher 88% or higher             Exercise Comments:   Exercise Comments     Row Name 01/04/23 1714           Exercise Comments First full day of exercise!  Patient was oriented to gym and equipment including functions, settings, policies, and procedures.  Patient's individual exercise prescription and treatment plan were reviewed.  All starting workloads were established based on the results of the 6 minute walk test done at initial orientation visit.  The plan for exercise progression was also introduced and progression will be customized based on patient's performance and goals.                Exercise Goals and Review:   Exercise Goals     Row Name 01/03/23 1713             Exercise Goals   Increase Physical Activity Yes       Intervention Provide advice, education, support and counseling about physical activity/exercise needs.;Develop an individualized exercise prescription for aerobic and resistive training based on initial evaluation findings, risk stratification, comorbidities and participant's personal goals.       Expected Outcomes Short Term: Attend rehab on a regular basis to increase amount of physical activity.;Long Term: Add in home exercise to make exercise part of routine and to increase amount of physical activity.;Long Term: Exercising regularly at least 3-5 days a week.       Increase Strength and Stamina Yes       Intervention Provide advice, education, support and counseling about physical activity/exercise needs.;Develop an individualized exercise prescription for aerobic and resistive training based on initial evaluation findings, risk stratification, comorbidities and participant's personal goals.       Expected Outcomes Short Term: Increase workloads from  initial exercise prescription for resistance, speed, and METs.;Short Term: Perform resistance training exercises routinely during rehab and  add in resistance training at home;Long Term: Improve cardiorespiratory fitness, muscular endurance and strength as measured by increased METs and functional capacity ( )       Able to understand and use rate of perceived exertion (RPE) scale Yes       Intervention Provide education and explanation on how to use RPE scale       Expected Outcomes Short Term: Able to use RPE daily in rehab to express subjective intensity level;Long Term:  Able to use RPE to guide intensity level when exercising independently       Able to understand and use Dyspnea scale Yes       Intervention Provide education and explanation on how to use Dyspnea scale       Expected Outcomes Short Term: Able to use Dyspnea scale daily in rehab to express subjective sense of shortness of breath during exertion;Long Term: Able to use Dyspnea scale to guide intensity level when exercising independently       Knowledge and understanding of Target Heart Rate Range (THRR) Yes       Intervention Provide education and explanation of THRR including how the numbers were predicted and where they are located for reference       Expected Outcomes Short Term: Able to state/look up THRR;Long Term: Able to use THRR to govern intensity when exercising independently;Short Term: Able to use daily as guideline for intensity in rehab       Able to check pulse independently Yes       Intervention Provide education and demonstration on how to check pulse in carotid and radial arteries.;Review the importance of being able to check your own pulse for safety during independent exercise       Expected Outcomes Long Term: Able to check pulse independently and accurately;Short Term: Able to explain why pulse checking is important during independent exercise       Understanding of Exercise Prescription Yes        Intervention Provide education, explanation, and written materials on patient's individual exercise prescription       Expected Outcomes Short Term: Able to explain program exercise prescription;Long Term: Able to explain home exercise prescription to exercise independently                Exercise Goals Re-Evaluation :  Exercise Goals Re-Evaluation     Row Name 01/04/23 1715 01/09/23 1502 01/23/23 0854 01/31/23 1756 02/05/23 1605     Exercise Goal Re-Evaluation   Exercise Goals Review Increase Physical Activity;Able to understand and use rate of perceived exertion (RPE) scale;Knowledge and understanding of Target Heart Rate Range (THRR);Understanding of Exercise Prescription;Increase Strength and Stamina;Able to check pulse independently Increase Physical Activity;Increase Strength and Stamina;Understanding of Exercise Prescription Increase Physical Activity;Increase Strength and Stamina;Understanding of Exercise Prescription Increase Physical Activity;Increase Strength and Stamina;Understanding of Exercise Prescription;Able to understand and use rate of perceived exertion (RPE) scale;Knowledge and understanding of Target Heart Rate Range (THRR);Able to check pulse independently Increase Physical Activity;Increase Strength and Stamina;Understanding of Exercise Prescription   Comments Reviewed RPE scale, THR and program prescription with pt today.  Pt voiced understanding and was given a copy of goals to take home. Debbi completed 2 sessions od rehab so far. she is quite limited due to some knee and hip pain that she experiences often. We tried out the Biostep and it gave her more hip pain. She did well on the T5Nustep. Her cardiologist provided her with BP restrictions for exercise and will continue to work with  Dr. Okey Dupre to figure out where her guidelines will be as it appeared that her BP went beyond her restriction, however, we are then limiting the patient with her exercise as she feels she can do  more as she felt good. We are awaiting to hear back from Dr. Okey Dupre on next steps. We will continue to monitor as she progresses in the program. Debbi is doing well in the program. She has improved to level 2 on the T5 nustep. Her cardiologist provided her with BP restrictions for exercise which has limited her progress. She states that she feels she can do more as she has exceeded her BP restriction but was stopped to lower her BP. She began walking the track and walked 4 laps before being stopped due to her BP going beyond her restriction. We will continue to monitor her progress in the program. Reviewed home exercise with pt today.  Pt plans to rejoin [employer] and use stationary bike at home for exercise.  Reviewed THR, pulse, RPE, sign and symptoms, pulse oximetery and when to call 911 or MD.  Also discussed weather considerations and indoor options.  Pt voiced understanding. Debbi is doing well in rehab. She is has been doing 30 min on the T5 NuStep.  We will continue to encourage her to try to walk and monitor her progress.   Expected Outcomes Short: Use RPE daily to regulate intensity.  Long: Follow program prescription in THR. Short: Continue initial exercise prescription, monitor BP closely and hear back from Dr.End Long: Build up overall strength and stamina Short: Continue to progress workloads without exceeding BP restrictions. Long: Continue to improve strength and stamina. Short: add1-2 days a week of at home exercise on off days of rehab. Long: become independent with exercise routine. Short: Continue to try to walk more Long: Continue to improve stamina    Row Name 02/21/23 1523 03/07/23 1705 03/21/23 0835 03/28/23 1741 03/28/23 1756     Exercise Goal Re-Evaluation   Exercise Goals Review Increase Physical Activity;Increase Strength and Stamina;Understanding of Exercise Prescription Increase Physical Activity;Increase Strength and Stamina;Understanding of Exercise Prescription Increase  Physical Activity;Understanding of Exercise Prescription Increase Physical Activity;Understanding of Exercise Prescription --   Comments Debbie attended 2 sessions this past 2 weeks.  She continues with the T5Nustep at level 5. She is using 3 Lb hand weights.Marland Kitchen WIll ask about home exercise activity next review and continue to monitor her progress. Debbie attended 2 sessions this past 2 weeks.  She continues with the T5Nustep at level 5. She is using 3 Lb hand weights.Marland Kitchen WIll ask about home exercise activity next review and continue to monitor her progress. Debbie attended 5 sessions for this review. She maintians the T5Nustep at level 4. RPE are 12.  Will work towards her increasing a level on the Nustep.  Will continue to encourage and monitor exercise  progression. Eunice Blase has not added exercise at home because she has a strict BP restrition of 150/90 or less and feels more comfortable exercising in rehab where we can monitor her blood pressure more efficiently during exercise. She does have a BP cuff at home and plans to continue monitoring BP at home upon graduation. she was encouraged that when she graduates and starts exercising at home to stop in the middle of her exercise to check BP with her automated cuff to make sure she is within range. She plans to exercise 3 days a week once she graduates. --   Expected Outcomes STG Attend all  scheduled sessions. Encourage to try other equipment. LTG Conitue with exercise progression STG Attend all scheduled sessions. Encourage to try other equipment. LTG Continue with exercise progression [id number] workloads as tolerated with goal of increased stamina and strength.   LTG: Continued exercise progression as tolerated during program and after discharge. Short: graduate from Cardiac rehab. Long: continue to exercise independently upon graduation and monitor BP --    Row Name 04/04/23 1345             Exercise Goal Re-Evaluation   Exercise Goals Review Increase  Physical Activity;Increase Strength and Stamina;Understanding of Exercise Prescription       Comments Debbi continues to do well in rehab. She recently completed her in which she improved by 71%. She continues to use the T5 nustep at level 4, and increased her hand weights from 3 to 4lbs. We will continue to monitor her progress in the program.       Expected Outcomes Short: Graduate. Long: Continue to exercise independently.                Discharge Exercise Prescription (Final Exercise Prescription Changes):  Exercise Prescription Changes - 04/04/23 1300       Response to Exercise   Blood Pressure (Admit) 132/52    Blood Pressure (Exercise) 174/86    Blood Pressure (Exit) 130/88    Heart Rate (Admit) 96 bpm    Heart Rate (Exercise) 101 bpm    Heart Rate (Exit) 89 bpm    Oxygen Saturation (Admit) 97 %    Oxygen Saturation (Exercise) 92 %    Oxygen Saturation (Exit) 98 %    Rating of Perceived Exertion (Exercise) 11    Duration Continue with 30 min of aerobic exercise without signs/symptoms of physical distress.    Intensity THRR unchanged      Progression   Progression Continue to progress workloads to maintain intensity without signs/symptoms of physical distress.    Average METs 1.6      Resistance Training   Training Prescription Yes    Weight 4lb    Reps 10-15      Interval Training   Interval Training No      T5 Nustep   Level 4    Minutes 30    METs 2      Home Exercise Plan   Plans to continue exercise at [employer] (comment)   re-join [employer], and seated bike at home   Frequency Add 2 additional days to program exercise sessions.    Initial Home Exercises Provided 01/31/23      Oxygen   Maintain Oxygen Saturation 88% or higher             Nutrition:  Target Goals: Understanding of nutrition guidelines, daily intake of sodium 1500mg , cholesterol 200mg , calories 30% from fat and 7% or less from saturated fats, daily to have 5 or  more servings of fruits and vegetables.  Education: All About Nutrition: -Group instruction provided by verbal, written material, interactive activities, discussions, models, and posters to present general guidelines for heart healthy nutrition including fat, fiber, MyPlate, the role of sodium in heart healthy nutrition, utilization of the nutrition label, and utilization of this knowledge for meal planning. Follow up email sent as well. Written material given at graduation. Flowsheet Row Cardiac Rehab from 04/04/2023 in [facility] Cardiac and Pulmonary Rehab  Education need identified 01/03/23       Biometrics:  Pre Biometrics - 04/02/23 1726  Pre Biometrics   Height 5' 9.76" (1.772 m)    Weight 442 lb (200.5 kg)    BMI (Calculated) 63.85    Single Leg Stand 1 seconds              Nutrition Therapy Plan and Nutrition Goals:  Nutrition Therapy & Goals - 03/06/23 1100       Nutrition Therapy   Diet Cardiac, Low Na    Protein (specify units) 90    Fiber 25 grams    Whole Grain Foods 3 servings    Saturated Fats 15 max. grams    Fruits and Vegetables 5 servings/day    Sodium 2 grams      Personal Nutrition Goals   Nutrition Goal watch sodium, less than 2000mg  per day    Personal Goal #2 Include veggies at meals    Comments Patient has been doing keto for several years now. She's aware of the strict requirements needed to stay in ketosis. Told her than if she is not in ketosis she should do a low carb diet allowing for more flexibility and balanced plates (~[id number] as a example). She reports she lost ~50lbs following the diet and says it works for her. Educated on keto diet, potential pit falls, how protein and fat intake is higher than other diet. Explained the types of fat, sources, and how to read food labels. Encouraged her to choose heart healthy fats over poor fats like saturated or trans fat, especially given keto's high fat distribution. Reviewed 24hr food recall, and  emphasized the need to choose leaner protein, include more healthy fats, veggies, and fiber. Sodium is also a concern with some of the foods she eats. Talked with her about ways to reduce intake and cut out salting at table. 24-hours Recall:  B: eggs, bacon or sausages, coffee  L: pork chop (salt and pepper), green beans, shallots  D: hot dog no bun      Intervention Plan   Intervention Prescribe, educate and counsel regarding individualized specific dietary modifications aiming towards targeted core components such as weight, hypertension, lipid management, diabetes, heart failure and other comorbidities.;Nutrition handout(s) given to patient.    Expected Outcomes Short Term Goal: Understand basic principles of dietary content, such as calories, fat, sodium, cholesterol and nutrients.;Short Term Goal: A plan has been developed with personal nutrition goals set during dietitian appointment.;Long Term Goal: Adherence to prescribed nutrition plan.             Nutrition Assessments:  MEDIFICTS Score Key: ?70 Need to make dietary changes  40-70 Heart Healthy Diet ? 40 Therapeutic Level Cholesterol Diet  Flowsheet Row Cardiac Rehab from 04/04/2023 in [facility] Cardiac and Pulmonary Rehab  Picture Your Plate Total Score on Discharge 70      Picture Your Plate Scores: <45 Unhealthy dietary pattern with much room for improvement. 41-50 Dietary pattern unlikely to meet recommendations for good health and room for improvement. 51-60 More healthful dietary pattern, with some room for improvement.  >60 Healthy dietary pattern, although there may be some specific behaviors that could be improved.    Nutrition Goals Re-Evaluation:  Nutrition Goals Re-Evaluation     Row Name 01/31/23 1752 02/28/23 1730           Goals   Current Weight 446 lb (202.3 kg) 445 lb (201.9 kg)      Nutrition Goal Meet with RD Meet with RD      Comment Debbie wnats to continue with a keto diet but  sometimes finds herself  eating the wrong foods. Debbie wants to meet the RD      Expected Outcome Short: meet with the RD. Long: adhere a diet that adheres to her. Short: meet with the RD. Long: adhere a diet that adheres to her.               Nutrition Goals Discharge (Final Nutrition Goals Re-Evaluation):  Nutrition Goals Re-Evaluation - 02/28/23 1730       Goals   Current Weight 445 lb (201.9 kg)    Nutrition Goal Meet with RD    Comment Debbie wants to meet the RD    Expected Outcome Short: meet with the RD. Long: adhere a diet that adheres to her.             Psychosocial: Target Goals: Acknowledge presence or absence of significant depression and/or stress, maximize coping skills, provide positive support system. Participant is able to verbalize types and ability to use techniques and skills needed for reducing stress and depression.   Education: Stress, Anxiety, and Depression - Group verbal and visual presentation to define topics covered.  Reviews how body is impacted by stress, anxiety, and depression.  Also discusses healthy ways to reduce stress and to treat/manage anxiety and depression.  Written material given at graduation. Flowsheet Row Cardiac Rehab from 04/04/2023 in [facility] Cardiac and Pulmonary Rehab  Date 03/28/23  Educator SB  Instruction Review Code 1- [employer] Understanding       Education: Sleep Hygiene -Provides group verbal and written instruction about how sleep can affect your health.  Define sleep hygiene, discuss sleep cycles and impact of sleep habits. Review good sleep hygiene tips.    Initial Review & Psychosocial Screening:  Initial Psych Review & Screening - 12/27/22 1427       Initial Review   Current issues with Current Psychotropic Meds;Current Depression;Current Anxiety/Panic;Current Stress Concerns    Source of Stress Concerns Family      Family Dynamics   Good Support System? Yes   lives with sister, parents live nearby too, Son     Barriers    Psychosocial barriers to participate in program The patient should benefit from training in stress management and relaxation.;Psychosocial barriers identified (see note)      Screening Interventions   Interventions Encouraged to exercise;Provide feedback about the scores to participant;To provide support and resources with identified psychosocial needs    Expected Outcomes Short Term goal: Utilizing psychosocial counselor, staff and physician to assist with identification of specific Stressors or current issues interfering with healing process. Setting desired goal for each stressor or current issue identified.;Long Term Goal: Stressors or current issues are controlled or eliminated.;Short Term goal: Identification and review with participant of any Quality of Life or Depression concerns found by scoring the questionnaire.;Long Term goal: The participant improves quality of Life and PHQ9 Scores as seen by post scores and/or verbalization of changes             Quality of Life Scores:   Quality of Life - 04/04/23 1649       Quality of Life   Select Quality of Life      Quality of Life Scores   Health/Function Pre 17.2 %    Health/Function Post 21.57 %    Health/Function % Change 25.41 %    Socioeconomic Pre 23.63 %    Socioeconomic Post 25.29 %    Socioeconomic % Change  7.02 %    Psych/Spiritual Pre 17.79 %  Psych/Spiritual Post 22 %    Psych/Spiritual % Change 23.66 %    Family Pre 19.2 %    Family Post 19.1 %    Family % Change -0.52 %    GLOBAL Pre 19.07 %    GLOBAL Post 22.06 %    GLOBAL % Change 15.68 %            Scores of 19 and below usually indicate a poorer quality of life in these areas.  A difference of  2-3 points is a clinically meaningful difference.  A difference of 2-3 points in the total score of the Quality of Life Index has been associated with significant improvement in overall quality of life, self-image, physical symptoms, and general health in  studies assessing change in quality of life.  PHQ-9: Review Flowsheet       04/04/2023 01/03/2023  Depression screen PHQ 2/9  Decreased Interest 0 0  Down, Depressed, Hopeless 0 1  PHQ - 2 Score 0 1  Altered sleeping 0 0  Tired, decreased energy 1 2  Change in appetite 0 0  Feeling bad or failure about yourself  0 0  Trouble concentrating 0 0  Moving slowly or fidgety/restless 0 0  Suicidal thoughts 0 0  PHQ-9 Score 1 3  Difficult doing work/chores Somewhat difficult Somewhat difficult    Details           Interpretation of Total Score  Total Score Depression Severity:  1-4 = Minimal depression, 5-9 = Mild depression, 10-14 = Moderate depression, 15-19 = Moderately severe depression, 20-27 = Severe depression   Psychosocial Evaluation and Intervention:  Psychosocial Evaluation - 01/03/23 1658       Psychosocial Evaluation & Interventions   Comments Patient states she feels well mentally at this time compared to how she used to feel about 10 years ago when she was in a "darker" place. 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 She is excited to start the program and denies any other SI concerns at this time. Encouraged patient to notify if anything changes or starts to feel worse.             Psychosocial Re-Evaluation:  Psychosocial Re-Evaluation     Row Name 01/31/23 1757 02/28/23 1736           Psychosocial Re-Evaluation   Current issues with Current Psychotropic Meds;Current Sleep Concerns;History of Depression Current Psychotropic Meds;Current Sleep Concerns;History of Depression      Comments Her son is dating someone who is the same age as her but has broke up with her. The ex girlfriend of her son is trying to make waves and get into her family buisness. It has been a stressor on her since her son was dating her. She is getting up alot in the night the use  the bathroom and is interupting her sleep. Her sister is going to change to a different school system for work. She may have to move again due to her sisters work. Its creating some stress since she did not want to move again,      Expected Outcomes Short: Attend [facility] stress management education to decrease stress. Long: Maintain exercise Post HeartTrack to keep stress at a minimum. Short: Attend [facility]Track stress management education to decrease stress. Long: Maintain exercise Post HeartTrack to keep stress at a minimum.      Interventions Encouraged to  attend Cardiac Rehabilitation for the exercise Encouraged to attend Cardiac Rehabilitation for the exercise      Continue Psychosocial Services  Follow up required by staff Follow up required by staff               Psychosocial Discharge (Final Psychosocial Re-Evaluation):  Psychosocial Re-Evaluation - 02/28/23 1736       Psychosocial Re-Evaluation   Current issues with Current Psychotropic Meds;Current Sleep Concerns;History of Depression    Comments Her sister is going to change to a different school system for work. She may have to move again due to her sisters work. Its creating some stress since she did not want to move again,    Expected Outcomes Short: Attend HeartTrack stress management education to decrease stress. Long: Maintain exercise Post HeartTrack to keep stress at a minimum.    Interventions Encouraged to attend Cardiac Rehabilitation for the exercise    Continue Psychosocial Services  Follow up required by staff             Vocational Rehabilitation: Provide vocational rehab assistance to qualifying candidates.   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 Participant will state understanding/return demonstration of topics presented as noted by education test scores.  Learning Barriers/Preferences:  Learning Barriers/Preferences - 12/27/22 1425       Learning Barriers/Preferences   Learning Barriers Sight   glasses   Learning Preferences Group Instruction;Individual Instruction;Skilled Demonstration;Verbal Instruction;Written Material             General Cardiac Education Topics:  AED/CPR: - Group verbal and written instruction with the use of models to demonstrate the basic use of the AED with the basic ABC's of resuscitation.   Anatomy and Cardiac Procedures: - Group verbal and visual presentation and models provide information about basic cardiac anatomy and function. Reviews the testing methods done to diagnose heart disease and the outcomes of the test results. Describes the treatment choices: Medical Management, Angioplasty, or Coronary Bypass Surgery for treating various heart conditions including Myocardial Infarction, Angina, Valve Disease, and Cardiac Arrhythmias.  Written material given at graduation. Flowsheet Row Cardiac Rehab from 04/04/2023 in [facility] Cardiac and Pulmonary Rehab  Education need identified 01/03/23  Date 02/28/23  Educator MS  Instruction Review Code 1- Verbalizes Understanding       Medication Safety: - Group verbal and visual instruction to review commonly prescribed medications for heart and lung disease. Reviews the medication, class of the drug, and side effects. Includes the steps to properly store meds and maintain the prescription regimen.  Written material given at graduation. Flowsheet Row Cardiac Rehab from 04/04/2023 in [facility] Cardiac and Pulmonary Rehab  Date 03/07/23  Educator MS  Instruction Review Code 1- Verbalizes Understanding       Intimacy: - Group verbal  instruction through game format to discuss how heart and lung disease can affect sexual intimacy. Written material given at graduation.Normajean Glasgow Row Cardiac Rehab from 04/04/2023 in [facility] Cardiac and Pulmonary Rehab  Education need identified 01/03/23  Date 01/31/23  Educator [facility]  Instruction Review Code 1- [employer]  Know Your Numbers and Heart Failure: - Group verbal and visual instruction to discuss disease risk factors for cardiac and pulmonary disease and treatment options.  Reviews associated critical values for Overweight/Obesity, Hypertension, Cholesterol, and Diabetes.  Discusses basics of heart failure: signs/symptoms and treatments.  Introduces Heart Failure Zone chart for action plan for heart failure.  Written material given at graduation. Flowsheet Row Cardiac Rehab from 04/04/2023 in [facility] Cardiac and Pulmonary Rehab  Date 03/14/23  Educator MS  Instruction Review Code 1- Verbalizes Understanding       Infection Prevention: - Provides verbal and written material to individual with discussion of infection control including proper hand washing and proper equipment cleaning during exercise session. Flowsheet Row Cardiac Rehab from 04/04/2023 in [facility] Cardiac and Pulmonary Rehab  Date 12/27/22  Educator [facility]  Instruction Review Code 1- Verbalizes Understanding       Falls Prevention: - Provides verbal and written material to individual with discussion of falls prevention and safety. Flowsheet Row Cardiac Rehab from 04/04/2023 in [facility] Cardiac and Pulmonary Rehab  Date 12/27/22  Educator [facility]  Instruction Review Code 1- Verbalizes Understanding       Other: -Provides group and verbal instruction on various topics (see comments) Flowsheet Row Cardiac Rehab from 04/04/2023 in [facility] Cardiac and Pulmonary Rehab  Date 02/21/23  Educator MS- [employer]  Instruction Review Code 1- Verbalizes Understanding       Knowledge Questionnaire Score:  Knowledge  Questionnaire Score - 04/04/23 1649       Knowledge Questionnaire Score   Post Score 26/26             Core Components/Risk Factors/Patient Goals at Admission:  Personal Goals and Risk Factors at Admission - 01/03/23 1714       Core Components/Risk Factors/Patient Goals on Admission    Weight Management Yes;Obesity;Weight Loss    Intervention Weight Management: Develop a combined nutrition and exercise program designed to reach desired caloric intake, while maintaining appropriate intake of nutrient and fiber, sodium and fats, and appropriate energy expenditure required for the weight goal.;Weight Management: Provide education and appropriate resources to help participant work on and attain dietary goals.;Weight Management/Obesity: Establish reasonable short term and long term weight goals.;Obesity: Provide education and appropriate resources to help participant work on and attain dietary goals.    Admit Weight 442 lb (200.5 kg)    Goal Weight: Short Term 436 lb (197.8 kg)    Goal Weight: Long Term 200 lb (90.7 kg)   per patient goal   Expected Outcomes Short Term: Continue to assess and modify interventions until short term weight is achieved;Long Term: Adherence to nutrition and physical activity/exercise program aimed toward attainment of established weight goal;Weight Loss: Understanding of general recommendations for a balanced deficit meal plan, which promotes 1-2 lb weight loss per week and includes a negative energy balance of [phone] kcal/d;Understanding recommendations for meals to include 15-35% energy as protein, 25-35% energy from fat, 35-60% energy from carbohydrates, less than 200mg  of dietary cholesterol, 20-35 gm of total fiber daily;Understanding of distribution of calorie intake throughout the day with the consumption of 4-5 meals/snacks    Hypertension Yes    Intervention Provide education on lifestyle modifcations including regular physical activity/exercise, weight  management, moderate sodium restriction and increased consumption of fresh fruit, vegetables, and low fat dairy, alcohol moderation, and smoking cessation.;Monitor prescription use compliance.    Expected Outcomes Short Term: Continued assessment and intervention until BP is < 140/71mm HG in hypertensive participants. < 130/36mm HG in  hypertensive participants with diabetes, heart failure or chronic kidney disease.;Long Term: Maintenance of blood pressure at goal levels.    Lipids Yes    Intervention Provide education and support for participant on nutrition & aerobic/resistive exercise along with prescribed medications to achieve LDL 70mg , HDL >40mg .    Expected Outcomes Short Term: Participant states understanding of desired cholesterol values and is compliant with medications prescribed. Participant is following exercise prescription and nutrition guidelines.;Long Term: Cholesterol controlled with medications as prescribed, with individualized exercise RX and with personalized nutrition plan. Value goals: LDL < 70mg , HDL > 40 mg.             Education:Diabetes - Individual verbal and written instruction to review signs/symptoms of diabetes, desired ranges of glucose level fasting, after meals and with exercise. Acknowledge that pre and post exercise glucose checks will be done for 3 sessions at entry of program.   Core Components/Risk Factors/Patient Goals Review:   Goals and Risk Factor Review     Row Name 01/31/23 1756 02/28/23 1739           Core Components/Risk Factors/Patient Goals Review   Personal Goals Review Weight Management/Obesity Hypertension      Review Debbi would like to work on weight loss. She is working with her sister to come up with a diet plan. She also wants to meet with the RD to go over her diet. Debbi has been up and down with her blood pressures, She is supposed to be 150/90 and below. She has trouble keeping her blood pressure down when she wants to move  faster and do more. She does not like that she cannot work hard to get healthier and has to slow down. Her doctor will not reasses blood pressure ranges aftter Cardiac rehab.      Expected Outcomes Short: lose 5 poiunds in the next few weeks. Long: Reach weight goal. Short: talk to doctor about blood pressure. Long: maintain adequate blood pressure readings.               Core Components/Risk Factors/Patient Goals at Discharge (Final Review):   Goals and Risk Factor Review - 02/28/23 1739       Core Components/Risk Factors/Patient Goals Review   Personal Goals Review Hypertension    Review Debbi has been up and down with her blood pressures, She is supposed to be 150/90 and below. She has trouble keeping her blood pressure down when she wants to move faster and do more. She does not like that she cannot work hard to get healthier and has to slow down. Her doctor will not reasses blood pressure ranges aftter Cardiac rehab.    Expected Outcomes Short: talk to doctor about blood pressure. Long: maintain adequate blood pressure readings.             ITP Comments:  ITP Comments     Row Name 12/27/22 1441 01/03/23 1649 01/04/23 1714 01/10/23 0800 03/06/23 1357   ITP Comments Completed virtual orientation today.  EP evaluation is scheduled for Wed 5/8 at 230.  Documentation for diagnosis can be found in [facility] encounter 12/12/22. Completed and gym orientation. Initial ITP created and sent for review to Dr. Bethann Punches, Medical Director. Provided handouts on stress management, breathing techniques and progressive muscular relaxation. Reviewed education on stress management as that is patient's biggest concern at this time- talked about importance on control. First full day of exercise!  Patient was oriented to gym and equipment including functions, settings, policies, and  procedures.  Patient's individual exercise prescription and treatment plan were reviewed.  All starting workloads were  established based on the results of the 6 minute walk test done at initial orientation visit.  The plan for exercise progression was also introduced and progression will be customized based on patient's performance and goals. 30 Day review completed. Medical Director ITP review done, changes made as directed, and signed approval by Medical Director.   new to program 30 Day review completed. Medical Director ITP review done, changes made as directed, and signed approval by Medical Director.    Row Name 04/04/23 1119 04/11/23 1652         ITP Comments 30 Day review completed. Medical Director ITP review done, changes made as directed, and signed approval by Medical Director. Jobyna graduated today from  rehab with 36 sessions completed.  Details of the patient's exercise prescription and what She needs to do in order to continue the prescription and progress were discussed with patient.  Patient was given a copy of prescription and goals.  Patient verbalized understanding.  Lajuanda plans to continue to exercise by joining [employer].               Comments: Discharge ITP

## 2023-04-11 NOTE — Progress Notes (Signed)
Daily Session Note  Patient Details  Name: Claudia Campbell MRN: [id number] Date of Birth: [birth year] Referring Provider:   Flowsheet Row Cardiac Rehab from 01/03/2023 in [facility] Cardiac and Pulmonary Rehab  Referring Provider End, Cristal Deer MD       Encounter Date: 04/11/2023  Check In:  Session Check In - 04/11/23 1651       Check-In   Supervising physician immediately available to respond to emergencies See telemetry face sheet for immediately available ER MD    Location [facility]-Cardiac & Pulmonary Rehab    Staff Present Hulen Luster, BS, RRT, CPFT;Maxon Conetta BS, , Exercise Physiologist;Tamica Covell Jewel Baize, RN BSN    Virtual Visit No    Medication changes reported     No    Fall or balance concerns reported    No    Warm-up and Cool-down Performed on first and last piece of equipment    Resistance Training Performed Yes    VAD Patient? No    PAD/SET Patient? No      Pain Assessment   Currently in Pain? No/denies                Social History   Tobacco Use  Smoking Status Never  Smokeless Tobacco Never    Goals Met:  Independence with exercise equipment Exercise tolerated well No report of concerns or symptoms today Strength training completed today  Goals Unmet:  Not Applicable  Comments:  Binnie graduated today from  rehab with 36 sessions completed.  Details of the patient's exercise prescription and what She needs to do in order to continue the prescription and progress were discussed with patient.  Patient was given a copy of prescription and goals.  Patient verbalized understanding.  Nayima plans to continue to exercise by joining [employer].    Dr. Bethann Punches is Medical Director for [facility] Cardiac Rehabilitation.  Dr. Vida Rigger is Medical Director for [facility] Pulmonary Rehabilitation.

## 2023-04-11 NOTE — Progress Notes (Addendum)
Discharge Summary   [name] DOB: Jan 06, 1971   [name] graduated today from  rehab with 36 sessions completed.  Details of the patient's exercise prescription and what She needs to do in order to continue the prescription and progress were discussed with patient.  Patient was given a copy of prescription and goals.  Patient verbalized understanding.  Claudia Campbell plans to continue to exercise by using her home equipment.    6 Minute Walk  Row Name 01/03/23 1703 04/02/23 1723 6 Minute Walk Phase Initial Discharge Distance 585 feet 1000 feet Distance % Change -- 71 % Distance Feet Change -- 415 ft Walk Time 4.06 minutes 6 minutes # of Rest Breaks 1  2:54-4:48 0 MPH 1.63 1.89 METS 1.03 1.48 RPE 11 15 Perceived Dyspnea  1 1 VO2 Peak 3.62 5.2 Symptoms Yes (comment) Yes (comment) Comments left sided hip pain 2/10, SOB knee pain Resting HR 81 bpm 85 bpm Resting BP 124/78 122/80 Resting Oxygen Saturation  97 % 95 % Exercise Oxygen Saturation  during 6 min walk 96 % 97 % Max Ex. HR 121 bpm 98 bpm Max Ex. BP 162/84 162/84 2 Minute Post BP 130/80 120/80

## 2023-04-18 ENCOUNTER — Encounter: Payer: Self-pay | Admitting: Internal Medicine

## 2023-04-18 ENCOUNTER — Ambulatory Visit: Payer: BC Managed Care – PPO | Attending: Internal Medicine | Admitting: Internal Medicine

## 2023-04-18 VITALS — BP 120/82 | HR 82 | Ht 69.0 in | Wt >= 6400 oz

## 2023-04-18 DIAGNOSIS — Z79899 Other long term (current) drug therapy: Secondary | ICD-10-CM

## 2023-04-18 DIAGNOSIS — I10 Essential (primary) hypertension: Secondary | ICD-10-CM | POA: Diagnosis not present

## 2023-04-18 DIAGNOSIS — H34212 Partial retinal artery occlusion, left eye: Secondary | ICD-10-CM

## 2023-04-18 DIAGNOSIS — I2542 Coronary artery dissection: Secondary | ICD-10-CM | POA: Diagnosis not present

## 2023-04-18 DIAGNOSIS — I773 Arterial fibromuscular dysplasia: Secondary | ICD-10-CM

## 2023-04-18 DIAGNOSIS — E785 Hyperlipidemia, unspecified: Secondary | ICD-10-CM

## 2023-04-18 NOTE — Patient Instructions (Signed)
Medication Instructions:  Your physician recommends that you continue on your current medications as directed. Please refer to the Current Medication list given to you today.   *If you need a refill on your cardiac medications before your next appointment, please call your pharmacy*   Lab Work: Your provider would like for you to have following labs drawn today (Lipid, ALT).     Testing/Procedures: Your physician has requested that you have a renal artery duplex. During this test, an ultrasound is used to evaluate blood flow to the kidneys. Take your medications as you usually do. This will take place at [address] [facility] [address], [place] [address].  No food after 11PM the night before.  Water is OK. (Don't drink liquids if you have been instructed not to for ANOTHER test). Avoid foods that produce bowel gas, for 24 hours prior to exam (see below). No breakfast, no chewing gum, no smoking or carbonated beverages. Patient may take morning medications with water. Come in for test at least 15 minutes early to register.    Follow-Up: At [facility], you and your health needs are our priority.  As part of our continuing mission to provide you with exceptional heart care, we have created designated Provider Care Teams.  These Care Teams include your primary Cardiologist (physician) and Advanced Practice Providers (APPs -  Physician Assistants and Nurse Practitioners) who all work together to provide you with the care you need, when you need it.  We recommend signing up for the patient portal called "MyChart".  Sign up information is provided on this After Visit Summary.  MyChart is used to connect with patients for Virtual Visits (Telemedicine).  Patients are able to view lab/test results, encounter notes, upcoming appointments, etc.  Non-urgent messages can be sent to your provider as well.   To learn more about what you can do with MyChart, go to  [email or web address].    Your next appointment:   6 month(s)  Provider:   You may see [name], MD or one of the following Advanced Practice Providers on your designated Care Team:   Nicolasa Ducking, NP Eula Listen, PA-C Cadence Fransico Michael, PA-C Charlsie Quest, NP

## 2023-04-18 NOTE — Progress Notes (Signed)
Cardiology Office Note:  .   Date:  04/18/2023  ID:  Claudia Campbell, DOB [birth year], MRN [id number] PCP: Patrice Paradise, MD  [facility] Providers Cardiologist:  [name], MD     History of Present Illness: .   Claudia Campbell is a 52 y.o. 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  She was last seen in our office in June by Charlsie Quest, NP, at which time she was doing fairly well.  She remained frustrated by inability to escalate her exercise at cardiac rehab due to elevated blood pressure.  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  The study was unremarkable without evidence of stenosis or plaquing.  Today, Ms. Borntreger reports that she is feeling well.  She completed cardiac rehab last week and plans to continue exercising at home.  She has not had any chest pain, shortness of breath, palpitations, or lightheadedness.  Chronic lower extremity edema is stable.  She is not using a CPAP, as it has irritated her nose.  ROS: See HPI  Studies Reviewed: .        Carotid Doppler (03/30/2023): Normal study without evidence of carotid artery stenosis.  Bilateral antegrade vertebral artery flow.  Normal flow hemodynamics subclavian arteries.  Risk Assessment/Calculations:             Physical Exam:   VS:  BP 120/82 (BP Location: Left Arm, Patient Position: Sitting, Cuff Size: Normal)   Pulse 82   Ht 5\' 9"  (1.753 m)   Wt (!) 450 lb 6 oz (204.3 kg)   SpO2 96%   BMI 66.51 kg/m    Wt Readings from Last 3 Encounters:  04/18/23 (!) 450 lb 6 oz (204.3 kg)  04/02/23 (!) 442 lb (200.5 kg)   03/08/23 (!) 444 lb (201.4 kg)    General:  NAD. Neck: No JVD or HJR. Lungs: Clear to auscultation bilaterally without wheezes or crackles. Heart: Regular rate and rhythm without murmurs, rubs, or gallops. Abdomen: Soft, nontender, nondistended. Extremities: No lower extremity edema.  ASSESSMENT AND PLAN: .    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 Carpentier has recovered well from her non-STEMI due to scad of the LAD.  We discussed the natural history of spontaneous coronary artery dissection and its association with fibromuscular dysplasia.  We will plan to continue indefinite aspirin and statin therapy.  Will also arrange for a renal artery Doppler to screen for fibromuscular dysplasia.  I have encouraged her to continue doing light to moderate intensity exercise.  We will check a lipid panel and ALT when Ms. Komar returns for her renal artery Doppler.  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  Recent carotid Doppler was negative.  We will continue with atorvastatin.  Hypertension: Diastolic blood pressure upper normal today though resting blood pressures are typically quite well-controlled at home.  We will continue current medications and have Ms. Nicastro follow her blood pressure closely at home, particularly as she increases her exercise.  Morbid obesity: BMI remains greater than 65.  I encouraged her to keep working on weight loss through diet and exercise.     Dispo: Return  to clinic in 6 months.  Signed, [name], MD

## 2023-04-20 ENCOUNTER — Encounter: Payer: Self-pay | Admitting: Internal Medicine

## 2023-04-20 DIAGNOSIS — I10 Essential (primary) hypertension: Secondary | ICD-10-CM | POA: Insufficient documentation

## 2023-04-20 DIAGNOSIS — H34212 Partial retinal artery occlusion, left eye: Secondary | ICD-10-CM | POA: Insufficient documentation

## 2023-04-20 DIAGNOSIS — E785 Hyperlipidemia, unspecified: Secondary | ICD-10-CM | POA: Insufficient documentation

## 2023-04-21 LAB — ALT: ALT: 45 IU/L — ABNORMAL HIGH (ref 0–32)

## 2023-04-21 LAB — LIPID PANEL
Chol/HDL Ratio: 2.2 ratio (ref 0.0–4.4)
Cholesterol, Total: 119 mg/dL (ref 100–199)
HDL: 54 mg/dL (ref >39)
LDL Chol Calc (NIH): 46 mg/dL (ref 0–99)
Triglycerides: 100 mg/dL (ref 0–149)
VLDL Cholesterol Cal: 19 mg/dL (ref 5–40)

## 2023-04-23 ENCOUNTER — Other Ambulatory Visit: Payer: Self-pay

## 2023-04-23 DIAGNOSIS — Z79899 Other long term (current) drug therapy: Secondary | ICD-10-CM

## 2023-04-23 MED ORDER — ATORVASTATIN CALCIUM 20 MG PO TABS: 20.0000 mg | ORAL_TABLET | Freq: Every day | ORAL | 3 refills | Status: DC

## 2023-05-11 ENCOUNTER — Ambulatory Visit: Payer: BC Managed Care – PPO | Attending: Internal Medicine

## 2023-05-11 DIAGNOSIS — I10 Essential (primary) hypertension: Secondary | ICD-10-CM | POA: Diagnosis not present

## 2023-05-11 DIAGNOSIS — I2542 Coronary artery dissection: Secondary | ICD-10-CM

## 2023-06-18 ENCOUNTER — Other Ambulatory Visit: Payer: Self-pay

## 2023-06-19 ENCOUNTER — Other Ambulatory Visit: Payer: Self-pay

## 2023-06-19 MED ORDER — LOSARTAN POTASSIUM 25 MG PO TABS: 25.0000 mg | ORAL_TABLET | Freq: Every day | ORAL | 3 refills | Status: DC

## 2023-06-20 ENCOUNTER — Other Ambulatory Visit: Payer: Self-pay

## 2023-06-20 MED ORDER — METOPROLOL SUCCINATE ER 50 MG PO TB24: 50.0000 mg | ORAL_TABLET | Freq: Every evening | ORAL | 3 refills | Status: DC

## 2023-06-22 ENCOUNTER — Other Ambulatory Visit: Payer: Self-pay

## 2023-06-22 MED ORDER — ATORVASTATIN CALCIUM 20 MG PO TABS: 20.0000 mg | ORAL_TABLET | Freq: Every day | ORAL | 3 refills | Status: DC

## 2023-07-20 ENCOUNTER — Other Ambulatory Visit: Payer: Self-pay | Admitting: Emergency Medicine

## 2023-07-20 DIAGNOSIS — Z79899 Other long term (current) drug therapy: Secondary | ICD-10-CM

## 2023-07-21 LAB — LIPID PANEL
Chol/HDL Ratio: 2.5 ratio (ref 0.0–4.4)
Cholesterol, Total: 121 mg/dL (ref 100–199)
HDL: 49 mg/dL (ref >39)
LDL Chol Calc (NIH): 51 mg/dL (ref 0–99)
Triglycerides: 117 mg/dL (ref 0–149)
VLDL Cholesterol Cal: 21 mg/dL (ref 5–40)

## 2023-07-21 LAB — ALT: ALT: 36 [IU]/L — ABNORMAL HIGH (ref 0–32)

## 2023-07-23 ENCOUNTER — Other Ambulatory Visit: Payer: Self-pay

## 2023-07-23 DIAGNOSIS — Z79899 Other long term (current) drug therapy: Secondary | ICD-10-CM

## 2023-07-23 MED ORDER — ATORVASTATIN CALCIUM 10 MG PO TABS: 10.0000 mg | ORAL_TABLET | Freq: Every day | ORAL | 3 refills | Status: DC

## 2023-07-24 ENCOUNTER — Emergency Department: Payer: BC Managed Care – PPO

## 2023-07-24 ENCOUNTER — Encounter: Payer: Self-pay | Admitting: Emergency Medicine

## 2023-07-24 ENCOUNTER — Other Ambulatory Visit: Payer: Self-pay

## 2023-07-24 ENCOUNTER — Emergency Department
Admission: EM | Admit: 2023-07-24 | Discharge: 2023-07-24 | Disposition: A | Discharge status: Home / Self Care | Payer: BC Managed Care – PPO | Attending: Emergency Medicine | Admitting: Emergency Medicine

## 2023-07-24 DIAGNOSIS — R079 Chest pain, unspecified: Secondary | ICD-10-CM | POA: Diagnosis present

## 2023-07-24 LAB — CBC
HCT: 38.4 % (ref 36.0–46.0)
Hemoglobin: 12.3 g/dL (ref 12.0–15.0)
MCH: 27.8 pg (ref 26.0–34.0)
MCHC: 32 g/dL (ref 30.0–36.0)
MCV: 86.9 fL (ref 80.0–100.0)
Platelets: 307 10*3/uL (ref 150–400)
RBC: 4.42 MIL/uL (ref 3.87–5.11)
RDW: 15.3 % (ref 11.5–15.5)
WBC: 8.8 10*3/uL (ref 4.0–10.5)
nRBC: 0 % (ref 0.0–0.2)

## 2023-07-24 LAB — BASIC METABOLIC PANEL
Anion gap: 9 (ref 5–15)
BUN: 17 mg/dL (ref 6–20)
CO2: 23 mmol/L (ref 22–32)
Calcium: 9 mg/dL (ref 8.9–10.3)
Chloride: 104 mmol/L (ref 98–111)
Creatinine, Ser: 0.94 mg/dL (ref 0.44–1.00)
GFR, Estimated: >60 mL/min (ref >60)
Glucose, Bld: 127 mg/dL — ABNORMAL HIGH (ref 70–99)
Potassium: 3.9 mmol/L (ref 3.5–5.1)
Sodium: 136 mmol/L (ref 135–145)

## 2023-07-24 LAB — TROPONIN I (HIGH SENSITIVITY)
Troponin I (High Sensitivity): 3 ng/L (ref <18)
Troponin I (High Sensitivity): 4 ng/L (ref <18)

## 2023-07-24 NOTE — ED Notes (Signed)
Patient discharged at this time. Ambulated to lobby with independent and steady gait. Breathing unlabored speaking in full sentences. Verbalized understanding of all discharge, follow up, and medication teaching. Discharged homed with all belongings.   

## 2023-07-24 NOTE — ED Provider Notes (Signed)
[facility] Provider Note    Event Date/Time   First MD Initiated Contact with Patient 07/24/23 2037     (approximate)  History   Chief Complaint: Chest Pain  HPI  Claudia Campbell is a 52 y.o. female with a past medical history of anxiety, gastric reflux, scad in April who presents emergency department for chest pain.  According to the patient states she was exerting herself tonight, feeding the pets, states she had a busy evening when she began feeling central chest pain.  Patient states the pain seems to be in the same general area that she had when she had chest pain in April and had a scad.  She was concerned so she took a nitroglycerin tablet shortly followed by a second nitroglycerin tablet and felt somewhat weak/lightheaded.  Patient states the pain started somewhere around 5 PM tonight.  Currently during my evaluation patient states she is feeling much better, states the pain is gone and she believes she "freaked myself out."  Patient denies any shortness of breath nausea or diaphoresis.  Physical Exam   Triage Vital Signs: ED Triage Vitals  Encounter Vitals Group     BP 07/24/23 1829 (!) 156/92     Systolic BP Percentile --      Diastolic BP Percentile --      Pulse Rate 07/24/23 1829 97     Resp 07/24/23 1829 18     Temp 07/24/23 1829 98.2 F (36.8 C)     Temp Source 07/24/23 1829 Oral     SpO2 07/24/23 1829 98 %     Weight 07/24/23 1824 (!) 460 lb (208.7 kg)     Height 07/24/23 1824 5\' 9"  (1.753 m)     Head Circumference --      Peak Flow --      Pain Score 07/24/23 1824 7     Pain Loc --      Pain Education --      Exclude from Growth Chart --     Most recent vital signs: Vitals:   07/24/23 1829  BP: (!) 156/92  Pulse: 97  Resp: 18  Temp: 98.2 F (36.8 C)  SpO2: 98%    General: Awake, no distress.  CV:  Good peripheral perfusion.  Regular rate and rhythm  Resp:  Normal effort.  Equal breath sounds bilaterally.  Abd:  No  distention.  Soft, nontender.  No rebound or guarding.  ED Results / Procedures / Treatments   EKG  EKG viewed and interpreted by myself shows a normal sinus rhythm at 100 bpm with a narrow QRS, normal axis, normal intervals, no concerning ST changes.  RADIOLOGY  EKG viewed and interpreted by myself shows a normal sinus rhythm at 100 bpm with a narrow QRS, normal axis, normal intervals, no concerning ST changes.   MEDICATIONS ORDERED IN ED: Medications - No data to display   IMPRESSION / MDM / ASSESSMENT AND PLAN / ED COURSE  I reviewed the triage vital signs and the nursing notes.  Patient's presentation is most consistent with acute presentation with potential threat to life or bodily function.  Patient presents to the emergency department for chest pain.  Had a scad event in April so she was very concerned today.  Patient states she thinks she freaked herself out now denies any chest pain.  No shortness of breath nausea or diaphoresis.  Overall the patient appears well.  EKG is reassuring, chest x-ray is clear.  Lab work is  reassuring with a normal CBC normal chemistry and a negative troponin.  However given the patient's history we will repeat a troponin to ensure no change.  Patient agreeable plan of care.  Patient follows up with Dr. Okey Dupre.  Patient's repeat troponin remains negative.  Patient remains chest pain-free.  Will discharge with cardiology follow-up.  Patient agreeable to plan of care.  FINAL CLINICAL IMPRESSION(S) / ED DIAGNOSES   Chest pain  Note:  This document was prepared using Dragon voice recognition software and may include unintentional dictation errors.   Minna Antis, MD 07/24/23 2125

## 2023-09-07 ENCOUNTER — Other Ambulatory Visit: Payer: Self-pay

## 2023-09-07 MED ORDER — ATORVASTATIN CALCIUM 10 MG PO TABS: 10.0000 mg | ORAL_TABLET | Freq: Every day | ORAL | 0 refills | Status: DC

## 2023-09-07 NOTE — Telephone Encounter (Signed)
 Requested Prescriptions   Signed Prescriptions Disp Refills   atorvastatin  (LIPITOR) 10 MG tablet 90 tablet 0    Sig: Take 1 tablet (10 mg total) by mouth daily. Due follow up visit.  PLEASE CALL OFFICE TO SCHEDULE APPOINTMENT PRIOR TO NEXT REFILL    Authorizing Provider: END, CHRISTOPHER    Ordering User: CLAUDENE POWELL CROME   Last office visit:  04/18/23 with plan to f/u in 6 months Next office visit:  none/does have active recall

## 2024-01-02 ENCOUNTER — Telehealth: Payer: Self-pay | Admitting: Internal Medicine

## 2024-01-02 NOTE — Telephone Encounter (Signed)
   Pre-operative Risk Assessment    Patient Name: Claudia Campbell  DOB: 1970-09-23 MRN: [id number]   Date of last office visit: unknown Date of next office visit: unknown   Request for Surgical Clearance    Procedure:   colonoscopy  Date of Surgery:  Clearance 05/09/24                                Surgeon:  not indicated Surgeon's Group or Practice Name:  [facility] Phone number:  [phone] Fax number:  [phone]   Type of Clearance Requested:   - Medical    Type of Anesthesia:  General    Additional requests/questions:    SignedGeroldine Kotyk   01/02/2024, 3:47 PM

## 2024-01-03 NOTE — Telephone Encounter (Signed)
   Name: Claudia Campbell  DOB: 1970-12-15  MRN: [id number]  Primary Cardiologist: Sammy Crisp, MD  Chart reviewed as part of pre-operative protocol coverage. Because of Carmesha Eble's past medical history and time since last visit, she will require a follow-up in-office visit in order to better assess preoperative cardiovascular risk.  Patient has an office visit scheduled on 01/25/2024 with Ronald Cockayne, NP. Appointment notes have been updated to reflect need for pre-op evaluation.   Pre-op covering staff:  - Please contact requesting surgeon's office via preferred method (i.e, phone, fax) to inform them of need for appointment prior to surgery.   Morey Ar, NP  01/03/2024, 1:16 PM

## 2024-01-25 ENCOUNTER — Encounter: Payer: Self-pay | Admitting: Cardiology

## 2024-01-25 ENCOUNTER — Ambulatory Visit: Attending: Cardiology | Admitting: Cardiology

## 2024-01-25 VITALS — BP 120/80 | HR 90 | Ht 69.0 in | Wt >= 6400 oz

## 2024-01-25 DIAGNOSIS — H34212 Partial retinal artery occlusion, left eye: Secondary | ICD-10-CM

## 2024-01-25 DIAGNOSIS — Z01810 Encounter for preprocedural cardiovascular examination: Secondary | ICD-10-CM

## 2024-01-25 DIAGNOSIS — I10 Essential (primary) hypertension: Secondary | ICD-10-CM

## 2024-01-25 DIAGNOSIS — I214 Non-ST elevation (NSTEMI) myocardial infarction: Secondary | ICD-10-CM

## 2024-01-25 DIAGNOSIS — G4733 Obstructive sleep apnea (adult) (pediatric): Secondary | ICD-10-CM

## 2024-01-25 DIAGNOSIS — R0602 Shortness of breath: Secondary | ICD-10-CM

## 2024-01-25 DIAGNOSIS — I2542 Coronary artery dissection: Secondary | ICD-10-CM

## 2024-01-25 DIAGNOSIS — I2510 Atherosclerotic heart disease of native coronary artery without angina pectoris: Secondary | ICD-10-CM

## 2024-01-25 DIAGNOSIS — E890 Postprocedural hypothyroidism: Secondary | ICD-10-CM

## 2024-01-25 DIAGNOSIS — E785 Hyperlipidemia, unspecified: Secondary | ICD-10-CM

## 2024-01-25 MED ORDER — LOSARTAN POTASSIUM 25 MG PO TABS: 25.0000 mg | ORAL_TABLET | Freq: Every day | ORAL | 3 refills | Status: AC

## 2024-01-25 MED ORDER — METOPROLOL SUCCINATE ER 50 MG PO TB24: 50.0000 mg | ORAL_TABLET | Freq: Every evening | ORAL | 3 refills | Status: AC

## 2024-01-25 MED ORDER — ATORVASTATIN CALCIUM 10 MG PO TABS: 10.0000 mg | ORAL_TABLET | Freq: Every day | ORAL | 0 refills | Status: DC

## 2024-01-25 NOTE — Progress Notes (Signed)
 Cardiology Office Note   Date:  01/27/2024  ID:  Claudia Campbell, DOB 1971-06-21, MRN [id number] PCP: Delmus Ferri, MD  [facility] Providers Cardiologist:  Sammy Crisp, MD     History of Present Illness Claudia Campbell is a 53 y.o. female with a past medical history of anxiety, depression, Barrett's esophagus, gastroesophageal reflux disease, hypothyroidism status post ablation, kidney stones, migraines, restless leg syndrome, sleep apnea not currently on CPAP, vitamin D deficiency, family history of coronary artery disease, NSTEMI/scad who is here today for follow-up.   Patient previously been found to be bradycardic and encouraged to follow-up with a sleep study completed as well discontinue her weight loss journey.  She lost 25 pounds and remains chest pain-free with no anginal symptoms.  She was also followed with EP for previous syncopal episodes and was deemed a vasovagal event in nature.  Unfortunately she presented to the [facility] emergency department 11/2022 with nausea and dry heaves, high-sensitivity troponin peaked at 1559, she was placed on heparin  infusion was taken for left heart catheterization.  Catheterization revealed single-vessel coronary artery disease with irregular tapering in the distal LAD and small D4 branch with up to 70% stenosis.  Findings are suspicious for scad.  No significant disease observed in LMCA, left circumflex or RCA.  She was placed on 81 mg of aspirin  daily and a follow-up echocardiogram which revealed LVEF 50-55%, no valvular abnormalities were noted.  She was then subsequently discharged on 12/15/2022.   She was last seen in clinic 04/18/2023 by Dr End At that time she was feeling well.  She had completed cardiac rehab. She was not using her CPAP as she was having difficulty with nose irritation. There were no medication changes that were mde, but she was scheduled for a renal artery duplex for its association with fibromuscular dysplasia.  She  returns to clinic today stating overall from the cardiac perspective she has been doing well.  She had had 2 separate episodes of stress-induced chest discomfort.  1 time she had taken nitro.  That was several months back she had an additional recent 1 back in December when there were some issues with her son.  Since that time she has had no chest pain, shortness of breath, dyspnea on exertion, or palpitations.  She and her sister are both getting ready to start a weight loss program.  She states that she has been compliant with her current medication regimen with any undue side effects.  Unfortunately she does require surgical clearance for an upcoming colonoscopy.  As she has to have it done under general anesthesia.  She denies any hospitalizations or visits to the emergency department.   ROS: 10 point review of systems has been reviewed and considered negative the exception of what is been listed in the HPI  Studies Reviewed EKG Interpretation Date/Time:  Friday Jan 25 2024 15:26:35 EDT Ventricular Rate:  90 PR Interval:  198 QRS Duration:  90 QT Interval:  380 QTC Calculation: 464 R Axis:   -5  Text Interpretation: Normal sinus rhythm Inferior infarct (cited on or before 08-Mar-2023) Anterior infarct (cited on or before 08-Mar-2023) When compared with ECG of 24-Jul-2023 18:22, No significant change since last tracing Confirmed by Ronald Cockayne ([id number]) on 01/25/2024 3:34:43 PM    TTE 12/13/22 1. Left ventricular ejection fraction, by estimation, is 50 to 55%. The  left ventricle has low normal function. The left ventricle demonstrates  regional wall motion abnormalities (hypokinesis of the mid to distal  anterior,anteroseptal and apical  region). Left ventricular diastolic parameters were normal.   2. Right ventricular systolic function is normal. The right ventricular  size is normal. Tricuspid regurgitation signal is inadequate for assessing  PA pressure.   3. The mitral valve is  normal in structure. No evidence of mitral valve  regurgitation. No evidence of mitral stenosis.   4. The aortic valve is normal in structure. Aortic valve regurgitation is  not visualized. No aortic stenosis is present.   5. The inferior vena cava is normal in size with greater than 50%  respiratory variability, suggesting right atrial pressure of 3 mmHg.    LHC 12/10/22 Conclusions: Single-vessel coronary artery disease with irregular tapering distal LAD and small D4 branch with up to 70% stenosis.  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  No significant disease observed in the LMCA, LCx, or RCA. Probably mildly reduced left ventricular systolic function with apical hypokinesis, though suboptimal LV opacification limits evaluation (LVEF 45-50%). Moderately elevated left ventricular filling pressure (LVEDP 25-30 mmHg).   Recommendations: Continue aspirin  81 mg daily.  Defer addition of P2Y12 and other antithrombotic agents in the setting of spontaneous coronary artery dissection. Follow-up echocardiogram. Gentle diuresis and escalation of goal-directed medical therapy as tolerated. Recommend observation at least 1-2 more nights to ensure patient does not have recurrent chest pain to suggest propagation/recurrence of SCAD.   Sammy Crisp, MD [facility] Risk Assessment/Calculations     Physical Exam VS:  BP 120/80 (BP Location: Left Wrist, Patient Position: Sitting, Cuff Size: Normal)   Pulse 90   Ht 5\' 9"  (1.753 m)   Wt (!) 467 lb (211.8 kg)   SpO2 97%   BMI 68.96 kg/m    Wt Readings from Last 3 Encounters:  01/25/24 (!) 467 lb (211.8 kg)  07/24/23 (!) 460 lb (208.7 kg)  04/18/23 (!) 450 lb 6 oz (204.3 kg)    GEN: Well nourished, well developed in no acute distress NECK: No JVD; No carotid bruits CARDIAC: RRR, no murmurs, rubs, gallops RESPIRATORY:  Clear to auscultation without rales, wheezing or rhonchi  ABDOMEN: Soft, non-tender,  non-distended EXTREMITIES:  No edema; No deformity   ASSESSMENT AND PLAN CAD/NSTEMI/scad with single-vessel coronary artery disease tapering of the distal LAD and a small D4 branch with 70% stenosis.  Findings were suspicious for scad.  No significant disease otherwise.  She continues to deny any anginal anginal equivalents.  Stating that she had 2 episodes since her appointment approximately a year ago with the use of nitro once but related to stress.  EKG today reveals sinus rhythm with a rate of 90 with a known inferior and anterior infarct with no acute changes from prior studies.  She is continued on aspirin  81 mg daily and atorvastatin  10 mg daily indefinitely.  She has graduated from cardiac rehab.  No further ischemic testing needed at this time.  Hypertension with a blood pressure today 120/80.  Blood pressure is well-controlled today.  She has continued on losartan  25 mg daily and Toprol -XL 50 mg daily.  Encouraged to continue to monitor pressures 1 to 2 hours postmedication administration at home as well.  Mixed hyperlipidemia with her last LDL of 51. She is continued on atorvastatin  10 mg daily.  Obstructive sleep apnea which she continues to be noncompliant with CPAP.  Postablative hypothyroidism.  She is continued on levothyroxine .  Ongoing management per PCP.  Morbid obesity with a BMI of 68.96.  She is getting ready to start a new  weight loss program and continues to lose weight in small increments.  Congratulated on her weight loss thus far and encouraged to continue with her dietary and lifestyle modifications.  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  She will be continued on aspirin  81 mg daily and atorvastatin .  Preoperative cardiovascular examination with patient denies any angina or anginal equivalents and has no symptoms of decompensation.  She can proceed with her procedure at acceptable risk with no further cardiovascular  testing needed at this time.      Ms. Cadenhead perioperative risk of a major cardiac event is 0.9% according to the Revised Cardiac Risk Index (RCRI).  Therefore, she is at low risk for perioperative complications.   Her functional capacity is fair at 5.72 METs according to the Duke Activity Status Index (DASI). Recommendations: According to ACC/AHA guidelines, no further cardiovascular testing needed.  The patient may proceed to surgery at acceptable risk.      Dispo: Patient to return to clinic to see MD/APP in 11 to 12 months or sooner if needed for further evaluation.  Signed, Trejan Buda, NP

## 2024-01-25 NOTE — Patient Instructions (Addendum)
 Medication Instructions:  Your Physician recommend you continue on your current medication as directed.    *If you need a refill on your cardiac medications before your next appointment, please call your pharmacy*  Lab Work: No labs ordered today  If you have labs (blood work) drawn today and your tests are completely normal, you will receive your results only by: MyChart Message (if you have MyChart) OR A paper copy in the mail If you have any lab test that is abnormal or we need to change your treatment, we will call you to review the results.  Testing/Procedures: No test ordered today   Follow-Up: At [facility], you and your health needs are our priority.  As part of our continuing mission to provide you with exceptional heart care, our providers are all part of one team.  This team includes your primary Cardiologist (physician) and Advanced Practice Providers or APPs (Physician Assistants and Nurse Practitioners) who all work together to provide you with the care you need, when you need it.  Your next appointment:   12 month(s)  Provider:   Sammy Crisp, MD or Ronald Cockayne, NP

## 2024-01-28 NOTE — Progress Notes (Signed)
 Letter faxed to [phone] for clearance for colonoscopy

## 2024-05-01 ENCOUNTER — Other Ambulatory Visit: Payer: Self-pay | Admitting: Cardiology
